# Patient Record
Sex: Male | Born: 1960 | Race: White | Hispanic: No | Marital: Married | Smoking: Former smoker
Health system: Southern US, Community
[De-identification: ages and names within clinical notes are randomized; demographics above are authoritative.]

## PROBLEM LIST (undated history)

## (undated) DIAGNOSIS — I10 Essential (primary) hypertension: Secondary | ICD-10-CM

## (undated) DIAGNOSIS — Z803 Family history of malignant neoplasm of breast: Secondary | ICD-10-CM

## (undated) DIAGNOSIS — K635 Polyp of colon: Secondary | ICD-10-CM

## (undated) DIAGNOSIS — C801 Malignant (primary) neoplasm, unspecified: Secondary | ICD-10-CM

## (undated) DIAGNOSIS — E785 Hyperlipidemia, unspecified: Secondary | ICD-10-CM

## (undated) DIAGNOSIS — Z8042 Family history of malignant neoplasm of prostate: Secondary | ICD-10-CM

## (undated) DIAGNOSIS — L718 Other rosacea: Secondary | ICD-10-CM

## (undated) HISTORY — DX: Polyp of colon: K63.5

## (undated) HISTORY — PX: OTHER SURGICAL HISTORY: SHX169

## (undated) HISTORY — DX: Other rosacea: L71.8

## (undated) HISTORY — PX: MOUTH SURGERY: SHX715

## (undated) HISTORY — PX: COLON SURGERY: SHX602

## (undated) HISTORY — DX: Essential (primary) hypertension: I10

## (undated) HISTORY — DX: Family history of malignant neoplasm of prostate: Z80.42

## (undated) HISTORY — DX: Family history of malignant neoplasm of breast: Z80.3

## (undated) HISTORY — DX: Hyperlipidemia, unspecified: E78.5

---

## 2005-11-24 ENCOUNTER — Ambulatory Visit: Payer: Self-pay | Admitting: Family Medicine

## 2007-04-29 ENCOUNTER — Ambulatory Visit: Payer: Self-pay | Admitting: Family Medicine

## 2007-09-23 ENCOUNTER — Ambulatory Visit: Payer: Self-pay | Admitting: Family Medicine

## 2007-12-19 ENCOUNTER — Ambulatory Visit: Payer: Self-pay | Admitting: Family Medicine

## 2008-02-19 ENCOUNTER — Ambulatory Visit: Payer: Self-pay | Admitting: Family Medicine

## 2008-11-11 ENCOUNTER — Ambulatory Visit: Payer: Self-pay | Admitting: Family Medicine

## 2010-03-13 HISTORY — PX: COLONOSCOPY: SHX174

## 2010-05-11 ENCOUNTER — Encounter (INDEPENDENT_AMBULATORY_CARE_PROVIDER_SITE_OTHER): Payer: BC Managed Care – PPO | Admitting: Family Medicine

## 2010-05-11 ENCOUNTER — Encounter (INDEPENDENT_AMBULATORY_CARE_PROVIDER_SITE_OTHER): Payer: Self-pay | Admitting: *Deleted

## 2010-05-11 DIAGNOSIS — I10 Essential (primary) hypertension: Secondary | ICD-10-CM

## 2010-05-11 DIAGNOSIS — Z23 Encounter for immunization: Secondary | ICD-10-CM

## 2010-05-11 DIAGNOSIS — E785 Hyperlipidemia, unspecified: Secondary | ICD-10-CM

## 2010-05-11 DIAGNOSIS — Z79899 Other long term (current) drug therapy: Secondary | ICD-10-CM

## 2010-05-19 NOTE — Letter (Signed)
Summary: Pre Visit Letter Revised  [facility]  [address] [place], [place] [address]   Phone: [phone]  Fax: [phone]        05/11/2010 MRN: [id number] [facility] [address] [place], [place]  [address]             Procedure Date:  June 14, 2010   dir col- Dr Marina Goodell   Welcome to the Gastroenterology Division at [facility].    You are scheduled to see a nurse for your pre-procedure visit on May 31, 2010 at 11:30am on the 3rd floor at [facility], [address] [employer].  We ask that you try to arrive at our office 15 minutes prior to your appointment time to allow for check-in.  Please take a minute to review the attached form.  If you answer "Yes" to one or more of the questions on the first page, we ask that you call the person listed at your earliest opportunity.  If you answer "No" to all of the questions, please complete the rest of the form and bring it to your appointment.    Your nurse visit will consist of discussing your medical and surgical history, your immediate family medical history, and your medications.   If you are unable to list all of your medications on the form, please bring the medication bottles to your appointment and we will list them.  We will need to be aware of both prescribed and over the counter drugs.  We will need to know exact dosage information as well.    Please be prepared to read and sign documents such as consent forms, a financial agreement, and acknowledgement forms.  If necessary, and with your consent, a friend or relative is welcome to sit-in on the nurse visit with you.  Please bring your insurance card so that we may make a copy of it.  If your insurance requires a referral to see a specialist, please bring your referral form from your primary care physician.  No co-pay is required for this nurse visit.     If you cannot keep your appointment, please call [phone] to cancel or reschedule prior to  your appointment date.  This allows [place] the opportunity to schedule an appointment for another patient in need of care.    Thank you for choosing [facility] for your medical needs.  We appreciate the opportunity to care for you.  Please visit [place] at our website  to learn more about our practice.  Sincerely, The Gastroenterology Division

## 2010-05-31 ENCOUNTER — Ambulatory Visit (AMBULATORY_SURGERY_CENTER): Payer: BC Managed Care – PPO | Admitting: *Deleted

## 2010-05-31 VITALS — Ht 76.0 in | Wt 238.0 lb

## 2010-05-31 DIAGNOSIS — Z1211 Encounter for screening for malignant neoplasm of colon: Secondary | ICD-10-CM

## 2010-05-31 MED ORDER — PEG-KCL-NACL-NASULF-NA ASC-C 100 G PO SOLR: 1.0000 | Freq: Once | ORAL | Status: AC

## 2010-05-31 NOTE — Patient Instructions (Signed)
Pick up RX for Moviprep within 5 days of today. Pick up RX for Moviprep within 5 days of today.

## 2010-06-01 ENCOUNTER — Encounter: Payer: Self-pay | Admitting: Internal Medicine

## 2010-06-13 ENCOUNTER — Telehealth: Payer: Self-pay | Admitting: Internal Medicine

## 2010-06-13 ENCOUNTER — Encounter: Payer: Self-pay | Admitting: *Deleted

## 2010-06-13 NOTE — Telephone Encounter (Signed)
Patient was reading instructions that came with prep and they were different than what he had received from previsit.  Pharmacy gave pt wrong prep.  Patient is going back to pharmacy to exchange prep.  Previsit nurse entered in correct prep.  Pt informed to call back if there is a problem.

## 2010-06-14 ENCOUNTER — Ambulatory Visit (AMBULATORY_SURGERY_CENTER): Payer: BC Managed Care – PPO | Admitting: Internal Medicine

## 2010-06-14 ENCOUNTER — Encounter: Payer: Self-pay | Admitting: Internal Medicine

## 2010-06-14 VITALS — BP 129/87 | HR 67 | Temp 97.7°F | Resp 15 | Ht 76.0 in | Wt 235.0 lb

## 2010-06-14 DIAGNOSIS — D126 Benign neoplasm of colon, unspecified: Secondary | ICD-10-CM

## 2010-06-14 DIAGNOSIS — Z1211 Encounter for screening for malignant neoplasm of colon: Secondary | ICD-10-CM

## 2010-06-14 LAB — HM COLONOSCOPY: HM Colonoscopy: NORMAL

## 2010-06-14 NOTE — Patient Instructions (Signed)
Review handouts on polyps.  We will send you a letter within two weeks regarding your polyp and when to return to see [place].  Call us if you have any questions at [phone].  Thank you.

## 2010-06-15 ENCOUNTER — Telehealth: Payer: Self-pay | Admitting: *Deleted

## 2010-06-15 NOTE — Telephone Encounter (Signed)

## 2010-09-09 ENCOUNTER — Telehealth: Payer: Self-pay | Admitting: Family Medicine

## 2010-09-09 NOTE — Telephone Encounter (Signed)
rcd message of pt call needs refill Vytorin 10/40mg  1 qd # 30,  Benicar HCT 20/12.5 mg 1 qd  #30, [facility]   [phone]

## 2010-09-09 NOTE — Telephone Encounter (Signed)
Check when the last visit was and if less than a year, cover him for a total of a year. Then he will need an appt

## 2010-09-12 ENCOUNTER — Other Ambulatory Visit: Payer: Self-pay

## 2010-09-12 ENCOUNTER — Telehealth: Payer: Self-pay | Admitting: Family Medicine

## 2010-09-12 MED ORDER — EZETIMIBE-SIMVASTATIN 10-40 MG PO TABS: 1.0000 | ORAL_TABLET | Freq: Every day | ORAL | Status: DC

## 2010-09-12 MED ORDER — OLMESARTAN MEDOXOMIL-HCTZ 20-12.5 MG PO TABS: 1.0000 | ORAL_TABLET | Freq: Every day | ORAL | Status: DC

## 2010-09-12 NOTE — Telephone Encounter (Signed)
Pt's wife called re refills on Vytorin and Benicar , pt had called Friday re: refills and [facility] and no call back. Dr. Susann Givens approved medication refills. Cheri sent in refills electronically [facility]

## 2010-09-13 ENCOUNTER — Telehealth: Payer: Self-pay

## 2010-09-13 NOTE — Telephone Encounter (Signed)
PT CALLED Friday AND LEFT MESSAGE FOR MED AND NO ONE TOOK CARE OF IT UNTIL I CAME BACK ALL HAS BEEN DONE

## 2010-09-15 ENCOUNTER — Encounter: Payer: Self-pay | Admitting: Family Medicine

## 2010-10-17 ENCOUNTER — Other Ambulatory Visit: Payer: Self-pay

## 2010-10-17 MED ORDER — OMEGA-3-ACID ETHYL ESTERS 1 G PO CAPS: 2.0000 g | ORAL_CAPSULE | Freq: Two times a day (BID) | ORAL | Status: DC

## 2010-11-08 ENCOUNTER — Encounter: Payer: Self-pay | Admitting: Medical

## 2010-11-08 ENCOUNTER — Ambulatory Visit (INDEPENDENT_AMBULATORY_CARE_PROVIDER_SITE_OTHER): Payer: BC Managed Care – PPO | Admitting: Medical

## 2010-11-08 VITALS — BP 128/98 | HR 64 | Temp 97.7°F | Resp 16 | Ht 76.0 in | Wt 223.0 lb

## 2010-11-08 DIAGNOSIS — E785 Hyperlipidemia, unspecified: Secondary | ICD-10-CM

## 2010-11-08 DIAGNOSIS — I10 Essential (primary) hypertension: Secondary | ICD-10-CM

## 2010-11-08 DIAGNOSIS — R51 Headache: Secondary | ICD-10-CM

## 2010-11-08 LAB — CBC WITH DIFFERENTIAL/PLATELET
Basophils Absolute: 0 10*3/uL (ref 0.0–0.1)
HCT: 45.9 % (ref 39.0–52.0)
Lymphocytes Relative: 15 % (ref 12–46)
Monocytes Absolute: 0.3 10*3/uL (ref 0.1–1.0)
Neutro Abs: 4.5 10*3/uL (ref 1.7–7.7)
Platelets: 184 10*3/uL (ref 150–400)
RDW: 13.1 % (ref 11.5–15.5)
WBC: 5.7 10*3/uL (ref 4.0–10.5)

## 2010-11-08 LAB — POCT URINALYSIS DIPSTICK
Leukocytes, UA: NEGATIVE
Nitrite, UA: NEGATIVE
Protein, UA: NEGATIVE
Urobilinogen, UA: NEGATIVE

## 2010-11-08 LAB — COMPREHENSIVE METABOLIC PANEL
ALT: 44 U/L (ref 0–53)
CO2: 27 mEq/L (ref 19–32)
Calcium: 9.7 mg/dL (ref 8.4–10.5)
Chloride: 104 mEq/L (ref 96–112)
Creat: 0.99 mg/dL (ref 0.50–1.35)

## 2010-11-08 LAB — TSH: TSH: 2.256 u[IU]/mL (ref 0.350–4.500)

## 2010-11-08 NOTE — Progress Notes (Signed)
Subjective:    Dylan Fischer is a 50 y.o. male who presents for evaluation of fluctuating blood pressure - he is a walk in pt today/unscheduled. He notes that he has been on blood pressure medicine since at least 2009. However he has tried to work on diet changes in the last several months, has lost 14 pounds since February of this year. He is not exercising regularly though. What brought him in today though are changes in his blood pressure. Last week he felt a little different than usual, had a headache, and he checked his blood pressure at work and it was low, 90/60.  Thus over the course of that week he took only one half tablet of his blood pressure pill everyday instead of the normal 1 tablet.  However over last few days at work his blood pressures have been really high.  Yesterday his blood pressure was 165/130, and today a little bit less than that. So he started back on a whole tablet of his blood pressure pill last 2 days.  He wanted to come in to make sure things are okay.  Otherwise he feels fine, no chest pain, no difficulty breathing, no prior heart problems.  He does note an occasional soreness in his left chest, but it's very mild and he doesn't think much about it.    Patient's cardiac risk factors are: dyslipidemia, hypertension, male gender, sedentary lifestyle, smoking/ tobacco exposure and family hx/o heart disease in both parents, onset over age 36, but not sure of exact age at onset. Previous cardiac testing: none.  The following portions of the patient's history were reviewed and updated as appropriate: allergies, current medications, past family history, past medical history, past social history, past surgical history and problem list.  Past Medical History  Diagnosis Date  . Hyperlipidemia   . Hypertension   . Colonic polyp     Review of Systems General: No fevers, chills, sweats, fatigue Skin: No diaphoresis HEENT: No recent sore throat, congestion, allergy symptoms,  sinus pressure Lungs: No shortness of breath, cough, wheezing Heart: No chest pain, palpitations, lower extremity edema, PND, orthopnea Abdomen: No abdominal pain, nausea, vomiting Neurology: No numbness, weakness, tingling, vision changes Psych: Some increased stress with work, no relationship problems at home, kids in college and this has caused some stress   Objective:     Gen.: Well-developed, well-nourished, white male, tall, seems a little anxious Skin: Unremarkable, not diaphoretic HEENT: Conjunctiva unremarkable, nares patent, pharynx normal Neck: supple, nontender, no thyromegaly, no JVD, no bruits Heart: RRR, normal S1, S2, no murmurs or gallops Lungs: CTA bilat, no wheezes, rhonchi, or rales Ext: no edema, cyanosis or clubbing Neuro: CN2-12 intact, no focal changes Psych: watery eyes, seems anxious, but states that his stressors aren't that significant  Cardiographics ECG: nsr, rate 62bpm, normal intervals, axis 62 degrees, T wave inversions V1, V2, somewhat poor R wave progression, otherwise no acute changes, no prior to compare.      Assessment:   Encounter Diagnoses  Name Primary?  Marland Kitchen Unspecified essential hypertension Yes  . Hyperlipidemia   . Headache      Plan:   Orthostatic vitals: Lying          136/88, pulse 128 Sitting         128/98, pulse 121 Standing     126/88, pulse 124  Discussed case with Dr. Susann Givens, supervising physician.  I advised pt that he has lost 14 lb which may contributing to lower pressures on average.  Advised that there are no worrisome acute changes today on EKG, and my personal BP measurements on him today are reassuring.  Thus, advised he keep record of his blood pressures taken after resting , and taken in the afternoon.  Get those back to me in 2 weeks so I can get an idea of average readings.  He will c/t his Benicar HCT 20/12.5mg , 1 tablet daily for now.  Discussed signs/symptoms of acute coronary syndrome, and advised that  those symptoms would prompt calling 911, however, he is currently asymptomatic.  Call sooner if needed or if other concerns.

## 2010-11-09 ENCOUNTER — Telehealth: Payer: Self-pay | Admitting: *Deleted

## 2010-11-09 ENCOUNTER — Ambulatory Visit: Payer: BC Managed Care – PPO | Admitting: Medical

## 2010-11-09 NOTE — Telephone Encounter (Addendum)
Message copied by Dorthula Perfect on Wed Nov 09, 2010 10:31 AM ------      Message from: Aleen Campi, DAVID S      Created: Wed Nov 09, 2010  8:14 AM       Labs were fine.  Again reiterate that we want him to take his BP medication, 1 tablet daily, regardless of pressure reading.  He can take his pressure at work after resting for 5 minutes, and write numbers down.  He doesn't have to check every single day.  Have him return pressure readings in 2 weeks so we can review.              Pt informed of lab results. And to take BP medication daily.  Pt also notified to take BP readings and write them down.  Pt agreed and will bring readings by office in 2 weeks.  CM, LPN

## 2010-11-24 ENCOUNTER — Telehealth: Payer: Self-pay | Admitting: *Deleted

## 2010-11-24 NOTE — Telephone Encounter (Addendum)
Message copied by Dorthula Perfect on Thu Nov 24, 2010  5:21 PM ------      Message from: Aleen Campi, DAVID S      Created: Thu Nov 24, 2010  7:45 AM       BP readings are low to low normal.  See if he is feeling ok or has he had any dizziness, weakness?  If feeling ok, no problems, then have him f/u with Dr. Susann Givens within a month recheck on BP/meds.     Pt notified of BP readings and patient will call back within a month to schedule an appointment with Dr. Susann Givens for BP/med check.  CM,LPN

## 2011-02-14 ENCOUNTER — Other Ambulatory Visit: Payer: Self-pay | Admitting: Family Medicine

## 2011-03-11 ENCOUNTER — Other Ambulatory Visit: Payer: Self-pay | Admitting: Family Medicine

## 2011-05-21 ENCOUNTER — Other Ambulatory Visit: Payer: Self-pay | Admitting: Family Medicine

## 2011-07-10 ENCOUNTER — Other Ambulatory Visit: Payer: Self-pay | Admitting: Medical

## 2011-07-10 ENCOUNTER — Encounter: Payer: Self-pay | Admitting: Medical

## 2011-07-10 ENCOUNTER — Ambulatory Visit (INDEPENDENT_AMBULATORY_CARE_PROVIDER_SITE_OTHER): Payer: BC Managed Care – PPO | Admitting: Medical

## 2011-07-10 VITALS — BP 110/80 | HR 68 | Temp 98.2°F | Resp 16 | Wt 212.0 lb

## 2011-07-10 DIAGNOSIS — F4389 Other reactions to severe stress: Secondary | ICD-10-CM

## 2011-07-10 DIAGNOSIS — F430 Acute stress reaction: Secondary | ICD-10-CM

## 2011-07-10 DIAGNOSIS — E785 Hyperlipidemia, unspecified: Secondary | ICD-10-CM

## 2011-07-10 DIAGNOSIS — I10 Essential (primary) hypertension: Secondary | ICD-10-CM

## 2011-07-10 DIAGNOSIS — Z79899 Other long term (current) drug therapy: Secondary | ICD-10-CM

## 2011-07-10 DIAGNOSIS — F438 Other reactions to severe stress: Secondary | ICD-10-CM

## 2011-07-10 LAB — HEPATIC FUNCTION PANEL
ALT: 32 U/L (ref 0–53)
AST: 24 U/L (ref 0–37)
Alkaline Phosphatase: 54 U/L (ref 39–117)
Indirect Bilirubin: 0.6 mg/dL (ref 0.0–0.9)
Total Protein: 6.9 g/dL (ref 6.0–8.3)

## 2011-07-10 LAB — LIPID PANEL
Cholesterol: 180 mg/dL (ref 0–200)
Triglycerides: 110 mg/dL (ref <150)

## 2011-07-10 MED ORDER — ALPRAZOLAM 0.25 MG PO TABS
0.2500 mg | ORAL_TABLET | Freq: Every evening | ORAL | Status: AC | PRN
Start: 2011-07-10 — End: 2011-08-09

## 2011-07-10 NOTE — Progress Notes (Signed)
Subjective:   HPI  Dylan Fischer is a 51 y.o. male who presents for recheck on blood pressure and issues with his nerves.  Last visit here was 10/2010.    He notes that he usually runs on the low end of normal with his blood pressure, on medication and compliant.  However, he is concerned that he may need to be on medication to help control his pressure when his "nerves" act up.  For example, if he has a personal issue, if his nerves get the best of him, his BP will go up to 140-150s/90-100s, but usually runs low normal in general.   Lately he just notes the DBP going in the 90-100s.  Yesterday he had 140s/90s.  He notes personal issues with relationship is the only concern.  Work issues are fine.   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  His wife at times accuses him on things he's not doing, they have arguments over little things.  He says they always work things out on their own, but have been to counseling in the past.  She is not open to counseling currently.  He exercises some, has lost more weight with diet changes and exercise. No other aggravating or relieving factors.    He wants his fasting labs today as he is on medication for cholesterol and blood pressure.  He sees urology for prostate/PSA.  No other c/o.  The following portions of the patient's history were reviewed and updated as appropriate: allergies, current medications, past family history, past medical history, past social history, past surgical history and problem list.  Past Medical History  Diagnosis Date  . Hyperlipidemia   . Hypertension   . Colonic polyp     No Known Allergies   Review of Systems ROS reviewed and was negative other than noted in HPI or above.    Objective:   Physical Exam  General appearance: alert, no distress, WD/WN Oral cavity: MMM, no lesions Neck: supple, no lymphadenopathy, no thyromegaly, no masses Heart: RRR, normal S1, S2, no  murmurs Lungs: CTA bilaterally, no wheezes, rhonchi, or rales Pulses: 2+ symmetric Ext: no edema  Assessment and Plan :     Encounter Diagnoses  Name Primary?  . Acute stress reaction Yes  . Hyperlipidemia   . Encounter for long-term (current) use of other medications   . Essential hypertension, benign    Acute stress reaction - discussed his concerns.   Explained that BP can fluctuate with stress, flight or fight response, normal daily fluctuation as well.  Advised exercise regularly, advised he consider counseling to deal with the underlying issue - relationship issues/marital stress and stress from where he has 2 daughters out of the house now, 1 in college, 1 living out of state, some empty nester issues.   Discussed medication options including daily medication like citalopram vs acute use of buspar or xanax.  He has used xanax briefly in remote past for similar.  Thus, discussed risk/benefits of medication, can use prn use of Xanax for now, along with the other strategies discussed.  If having to use frequently, then we will need to recheck to discuss daily medication.  Hyperlipidemia - labs today, c/t same medication  HTN - controlled, c/t same medication

## 2011-07-10 NOTE — Patient Instructions (Signed)

## 2011-07-24 ENCOUNTER — Other Ambulatory Visit: Payer: Self-pay | Admitting: Family Medicine

## 2011-08-22 ENCOUNTER — Other Ambulatory Visit: Payer: Self-pay | Admitting: Family Medicine

## 2011-09-25 ENCOUNTER — Other Ambulatory Visit: Payer: Self-pay | Admitting: Family Medicine

## 2011-11-28 ENCOUNTER — Other Ambulatory Visit: Payer: Self-pay | Admitting: Medical

## 2011-12-27 ENCOUNTER — Other Ambulatory Visit: Payer: Self-pay | Admitting: Family Medicine

## 2011-12-27 NOTE — Telephone Encounter (Signed)
Patient needs a office visit before this medication runs out. 

## 2012-01-27 ENCOUNTER — Other Ambulatory Visit: Payer: Self-pay | Admitting: Family Medicine

## 2012-02-24 ENCOUNTER — Other Ambulatory Visit: Payer: Self-pay | Admitting: Medical

## 2012-02-27 ENCOUNTER — Other Ambulatory Visit: Payer: Self-pay | Admitting: Family Medicine

## 2012-02-27 ENCOUNTER — Other Ambulatory Visit: Payer: Self-pay | Admitting: Medical

## 2012-04-29 ENCOUNTER — Other Ambulatory Visit: Payer: Self-pay | Admitting: Medical

## 2012-05-03 ENCOUNTER — Other Ambulatory Visit: Payer: Self-pay | Admitting: Medical

## 2012-07-02 ENCOUNTER — Other Ambulatory Visit: Payer: Self-pay | Admitting: Medical

## 2012-08-28 ENCOUNTER — Other Ambulatory Visit: Payer: Self-pay | Admitting: Medical

## 2012-08-28 NOTE — Telephone Encounter (Signed)
PATIENT NEEDS TO SCHEDULE A OFFICE VISIT TO FOLLOW UP ON BLOOD PRESSURE LAST APPOINTMENT WAS 06/2011. HE IS DUE NOW

## 2012-10-03 ENCOUNTER — Other Ambulatory Visit: Payer: Self-pay | Admitting: Medical

## 2012-11-29 ENCOUNTER — Other Ambulatory Visit: Payer: Self-pay | Admitting: Medical

## 2012-12-09 ENCOUNTER — Other Ambulatory Visit: Payer: Self-pay | Admitting: Medical

## 2013-02-04 ENCOUNTER — Other Ambulatory Visit: Payer: Self-pay | Admitting: Family Medicine

## 2013-02-05 ENCOUNTER — Other Ambulatory Visit: Payer: Self-pay | Admitting: Family Medicine

## 2013-02-05 ENCOUNTER — Telehealth: Payer: Self-pay | Admitting: Family Medicine

## 2013-02-05 MED ORDER — OMEGA-3-ACID ETHYL ESTERS 1 G PO CAPS: 1.0000 g | ORAL_CAPSULE | Freq: Four times a day (QID) | ORAL | Status: DC

## 2013-02-05 MED ORDER — OLMESARTAN MEDOXOMIL-HCTZ 20-12.5 MG PO TABS: 1.0000 | ORAL_TABLET | Freq: Every day | ORAL | Status: DC

## 2013-02-05 NOTE — Telephone Encounter (Signed)
RX refills sent for only 30 days will give more refills once he has his OV. CLS

## 2013-02-05 NOTE — Telephone Encounter (Signed)
Pt called and need refill for Benicar and Lovaza.  The Lovaza he takes 4 tablets per day and we have listed 2 tablets per day, so his last refill was for only 15 day supply.  Please correct.  Pt has appt here on Monday for med check.  [facility]

## 2013-02-10 ENCOUNTER — Encounter: Payer: Self-pay | Admitting: Medical

## 2013-02-12 ENCOUNTER — Ambulatory Visit (INDEPENDENT_AMBULATORY_CARE_PROVIDER_SITE_OTHER): Payer: BC Managed Care – PPO | Admitting: Medical

## 2013-02-12 ENCOUNTER — Encounter: Payer: Self-pay | Admitting: Medical

## 2013-02-12 VITALS — BP 120/78 | HR 60 | Temp 98.1°F | Resp 16 | Wt 223.0 lb

## 2013-02-12 DIAGNOSIS — Z79899 Other long term (current) drug therapy: Secondary | ICD-10-CM

## 2013-02-12 DIAGNOSIS — E785 Hyperlipidemia, unspecified: Secondary | ICD-10-CM | POA: Insufficient documentation

## 2013-02-12 DIAGNOSIS — I10 Essential (primary) hypertension: Secondary | ICD-10-CM

## 2013-02-12 DIAGNOSIS — Z23 Encounter for immunization: Secondary | ICD-10-CM

## 2013-02-12 MED ORDER — OLMESARTAN MEDOXOMIL-HCTZ 20-12.5 MG PO TABS: 1.0000 | ORAL_TABLET | Freq: Every day | ORAL | Status: DC

## 2013-02-12 MED ORDER — OMEGA-3-ACID ETHYL ESTERS 1 G PO CAPS: 2.0000 g | ORAL_CAPSULE | Freq: Two times a day (BID) | ORAL | Status: DC

## 2013-02-12 MED ORDER — EZETIMIBE-SIMVASTATIN 10-40 MG PO TABS: 1.0000 | ORAL_TABLET | Freq: Every day | ORAL | Status: DC

## 2013-02-12 NOTE — Progress Notes (Signed)
Subjective: There for routine med check.  Here for follow-up of hypertension. Started on BP meds in 2009.   He is not exercising.  He claims to be adherent to a low-salt diet.  Blood pressure is well controlled at home.  SBP usually ranges 115-120.   DBP usually ranges 75-80.  Cardiac symptoms: none. Patient denies: chest pain, dyspnea, exertional chest pressure/discomfort, fatigue and palpitations. Cardiovascular risk factors: dyslipidemia, hypertension, male gender and sedentary lifestyle. Use of agents associated with hypertension: none. History of target organ damage: none.    Dylan Fischer is here for follow up of elevated lipids.  Put on lipid medications before 2009 . Compliance with treatment has been good.  Patient denies muscle pain associated with male medications.   He is a nonsmoker.    In general he is eating red meat, but tries to avoid fried foods.  He does eat out a lot though.    No new problems.     Colonoscopy was in 2012.  Sees urology every 1-2 years for prostate eval/screening.     Past Medical History  Diagnosis Date  . Hyperlipidemia   . Hypertension   . Colonic polyp    ROS as in subjective  Objective: Filed Vitals:   02/12/13 1446  BP: 120/78  Pulse: 60  Temp: 98.1 F (36.7 C)  Resp: 16    General appearance: alert, no distress, WD/WN, white male Oral cavity: MMM, no lesions Neck: supple, no lymphadenopathy, no thyromegaly, no masses, no bruits Heart: RRR, normal S1, S2, no murmurs Lungs: CTA bilaterally, no wheezes, rhonchi, or rales Abdomen: +bs, soft, non tender, non distended, no masses, no hepatomegaly, no splenomegaly Pulses: 2+ symmetric, upper and lower extremities, normal cap refill Ext: no edema GU/rectal - deferred to urology  Assessment: Encounter Diagnoses  Name Primary?  . Essential hypertension, benign Yes  . Hyperlipidemia   . Encounter for long-term (current) use of other medications   . Need for prophylactic vaccination and  inoculation against influenza      Plan: Advise he start exercising, recommend he eat more healthy foods, eat out less Hypertension-controlled on current medicine, refill medications today Hyperlipidemia-reviewed his recent labs from employer screen showing improved lipid panel, and Hgb A1c was 5.4%.  Labs from October 2014 Liver panel today Counseled on the influenza virus vaccine.  Vaccine information sheet given.  Influenza vaccine given after consent obtained. Follow-up pending lab

## 2013-02-13 LAB — HEPATIC FUNCTION PANEL
ALT: 42 U/L (ref 0–53)
Albumin: 4.7 g/dL (ref 3.5–5.2)
Indirect Bilirubin: 0.7 mg/dL (ref 0.0–0.9)
Total Protein: 7 g/dL (ref 6.0–8.3)

## 2013-02-20 ENCOUNTER — Encounter: Payer: Self-pay | Admitting: Medical

## 2013-05-09 ENCOUNTER — Other Ambulatory Visit: Payer: Self-pay | Admitting: Medical

## 2013-08-10 ENCOUNTER — Other Ambulatory Visit: Payer: Self-pay | Admitting: Medical

## 2013-09-08 ENCOUNTER — Other Ambulatory Visit: Payer: Self-pay | Admitting: Medical

## 2013-09-08 NOTE — Telephone Encounter (Signed)
PATIENT NEEDS A OFFICE VISIT TO FOLLOW UP ON MEDICATION

## 2013-10-16 ENCOUNTER — Other Ambulatory Visit: Payer: Self-pay | Admitting: Medical

## 2013-10-16 ENCOUNTER — Telehealth: Payer: Self-pay | Admitting: Internal Medicine

## 2013-10-16 MED ORDER — OMEGA-3-ACID ETHYL ESTERS 1 G PO CAPS: 2.0000 g | ORAL_CAPSULE | Freq: Two times a day (BID) | ORAL | Status: DC

## 2013-10-16 NOTE — Telephone Encounter (Signed)
Pt states that he is having joint pain again. He was changed from generic and needs to go back to brand name Lovaza. Send to [facility]

## 2013-10-22 ENCOUNTER — Telehealth: Payer: Self-pay | Admitting: Family Medicine

## 2013-10-22 NOTE — Telephone Encounter (Signed)
Patient called because he requested Rx for brand name lovaza because he is having joint pain since taking generic. He states that when he went to pick up Rx he was given generic again. Per his chart, we sent in name brand. [facility] Patient had refill waiting and then we sent in new RX so when patient picked up RX he was given the refill that was waiting not the new RX for brand name.  Dylan Fischer was able to get Rx thru the system, he will have to order and patient can pick up tomorrow afternoon. Left message for patient informing him of this

## 2013-11-11 ENCOUNTER — Other Ambulatory Visit: Payer: Self-pay | Admitting: Medical

## 2013-11-11 NOTE — Telephone Encounter (Signed)
PATIENT NEEDS A MEDICATION CHECK BEFORE HIS MEDICATION RUNS OUT

## 2013-12-08 ENCOUNTER — Other Ambulatory Visit: Payer: Self-pay | Admitting: Medical

## 2013-12-22 ENCOUNTER — Other Ambulatory Visit: Payer: Self-pay | Admitting: Medical

## 2013-12-30 ENCOUNTER — Telehealth: Payer: Self-pay | Admitting: Medical

## 2013-12-30 NOTE — Telephone Encounter (Signed)
Pt came by and dropped off labs. I am sending back in your folder.

## 2014-01-06 ENCOUNTER — Other Ambulatory Visit: Payer: Self-pay | Admitting: Medical

## 2014-01-06 NOTE — Telephone Encounter (Signed)
Patient was told in 02/2013 that he was told to follow in 6 months. I called the patient to let him know he was due for a follow up visit. He states that he drop off some lab work that he had done at his job last week. Does he still have to come in for a OV?

## 2014-01-09 ENCOUNTER — Encounter: Payer: Self-pay | Admitting: Medical

## 2014-01-09 NOTE — Telephone Encounter (Signed)
PLEASE SCHEDULE YOUR PHYSICAL FOR DECEMBER

## 2014-01-16 ENCOUNTER — Encounter: Payer: Self-pay | Admitting: Medical

## 2014-04-08 ENCOUNTER — Other Ambulatory Visit: Payer: Self-pay | Admitting: Medical

## 2014-04-08 NOTE — Telephone Encounter (Signed)
Looks like labs were done back in October 2015 so i will refill it

## 2014-05-08 ENCOUNTER — Other Ambulatory Visit: Payer: Self-pay | Admitting: Medical

## 2014-05-20 ENCOUNTER — Encounter: Payer: Self-pay | Admitting: Medical

## 2014-05-20 ENCOUNTER — Ambulatory Visit (INDEPENDENT_AMBULATORY_CARE_PROVIDER_SITE_OTHER): Payer: BLUE CROSS/BLUE SHIELD | Admitting: Medical

## 2014-05-20 VITALS — BP 120/80 | HR 59 | Temp 97.8°F | Resp 15 | Wt 230.0 lb

## 2014-05-20 DIAGNOSIS — M25511 Pain in right shoulder: Secondary | ICD-10-CM

## 2014-05-20 DIAGNOSIS — I10 Essential (primary) hypertension: Secondary | ICD-10-CM

## 2014-05-20 DIAGNOSIS — E785 Hyperlipidemia, unspecified: Secondary | ICD-10-CM | POA: Diagnosis not present

## 2014-05-20 LAB — CBC WITH DIFFERENTIAL/PLATELET
BASOS PCT: 0 % (ref 0–1)
Basophils Absolute: 0 10*3/uL (ref 0.0–0.1)
EOS PCT: 2 % (ref 0–5)
Eosinophils Absolute: 0.1 10*3/uL (ref 0.0–0.7)
HEMATOCRIT: 47.4 % (ref 39.0–52.0)
Hemoglobin: 16.3 g/dL (ref 13.0–17.0)
LYMPHS ABS: 1 10*3/uL (ref 0.7–4.0)
Lymphocytes Relative: 25 % (ref 12–46)
MCH: 30.8 pg (ref 26.0–34.0)
MCHC: 34.4 g/dL (ref 30.0–36.0)
MCV: 89.6 fL (ref 78.0–100.0)
MPV: 9.8 fL (ref 8.6–12.4)
Monocytes Absolute: 0.3 10*3/uL (ref 0.1–1.0)
Monocytes Relative: 9 % (ref 3–12)
Neutro Abs: 2.4 10*3/uL (ref 1.7–7.7)
Neutrophils Relative %: 64 % (ref 43–77)
Platelets: 180 10*3/uL (ref 150–400)
RBC: 5.29 MIL/uL (ref 4.22–5.81)
RDW: 13.1 % (ref 11.5–15.5)
WBC: 3.8 10*3/uL — AB (ref 4.0–10.5)

## 2014-05-20 LAB — COMPREHENSIVE METABOLIC PANEL
ALBUMIN: 4.4 g/dL (ref 3.5–5.2)
ALT: 52 U/L (ref 0–53)
AST: 31 U/L (ref 0–37)
Alkaline Phosphatase: 44 U/L (ref 39–117)
BUN: 15 mg/dL (ref 6–23)
CALCIUM: 9.4 mg/dL (ref 8.4–10.5)
CHLORIDE: 103 meq/L (ref 96–112)
CO2: 27 mEq/L (ref 19–32)
Creat: 0.96 mg/dL (ref 0.50–1.35)
Glucose, Bld: 103 mg/dL — ABNORMAL HIGH (ref 70–99)
Potassium: 4.2 mEq/L (ref 3.5–5.3)
Sodium: 140 mEq/L (ref 135–145)
TOTAL PROTEIN: 6.8 g/dL (ref 6.0–8.3)
Total Bilirubin: 1 mg/dL (ref 0.2–1.2)

## 2014-05-20 MED ORDER — EZETIMIBE-SIMVASTATIN 10-40 MG PO TABS: 1.0000 | ORAL_TABLET | Freq: Every day | ORAL | Status: DC

## 2014-05-20 MED ORDER — OLMESARTAN MEDOXOMIL-HCTZ 20-12.5 MG PO TABS: 1.0000 | ORAL_TABLET | Freq: Every day | ORAL | Status: DC

## 2014-05-20 MED ORDER — LOVAZA 1 G PO CAPS: 2.0000 g | ORAL_CAPSULE | Freq: Two times a day (BID) | ORAL | Status: DC

## 2014-05-20 NOTE — Progress Notes (Signed)
Subjective: Here for routine follow-up and med check. Last visit here December 2014, here for recheck on hypertension and hyperlipidemia.  Hypertension-medication includes Benicar HCT 20/12.5 once daily.  Hyperlipidemia-medication includes lovaza and Vytorin.  Patient's cardiac risk factors are: advanced age (older than 54 for men, 66 for women), dyslipidemia, hypertension and male gender.   Works as a Designer, jewellery.  Nonsmoker.    Of note, he had a colonoscopy in 2012, due next year.  Trying to do some exercise with body weight but having some right shoulder issues.   Had trauma to shoulder years ago.  No other c/o.  Past Medical History  Diagnosis Date  . Hyperlipidemia   . Hypertension   . Colonic polyp     ROS as in subjective  Objective: BP 120/80 mmHg  Pulse 59  Temp(Src) 97.8 F (36.6 C) (Oral)  Resp 15  Wt 230 lb (104.327 kg)  General appearance: alert, no distress, WD/WN, white male Oral cavity: MMM, no lesions Neck: supple, no lymphadenopathy, no thyromegaly, no masses Heart: RRR, normal S1, S2, no murmurs Lungs: CTA bilaterally, no wheezes, rhonchi, or rales Abdomen: +bs, soft, non tender, non distended, no masses, no hepatomegaly, no splenomegaly Pulses: 2+ symmetric, upper and lower extremities, normal cap refill Ext: no edema Neuro: normal UE strength, DTRs, sensation, CN2-12 intact MSK: right shoulder with somewhat decreased internal ROM, mildly tender with apprehension test, mild pain with right adduction, otherwise exam unremarkable.  Left arm exam unremarkable  Assessment: Encounter Diagnoses  Name Primary?  . Essential hypertension Yes  . Hyperlipidemia   . Right shoulder pain     Plan: HTN - continue current medication, reviewed EKG from 2012, labs today  Hyperlipidemia - I reviewed a lipid panel from October 2015 that was sent to [facility] showing HDL 39, total cholesterol 163, triglycerides 201, hemoglobin A1c 5.6%, BMI 27.4, blood pressure  at that time 132/88 LDL cholesterol 84, cholesterol HDL ratio 4.2.  C/t Vytorin and Lovaza.  Right shoulder pain-gave a home exercise rehabilitation routine to begin. He will work on these exercises over the next 1-2 months, follow-up if not seeing significant improvement

## 2014-05-21 NOTE — Progress Notes (Signed)
LM to [facility] 

## 2014-06-02 ENCOUNTER — Other Ambulatory Visit: Payer: Self-pay | Admitting: Medical

## 2014-12-18 ENCOUNTER — Other Ambulatory Visit: Payer: Self-pay | Admitting: Medical

## 2015-01-20 ENCOUNTER — Other Ambulatory Visit: Payer: Self-pay | Admitting: Medical

## 2015-03-21 ENCOUNTER — Other Ambulatory Visit: Payer: Self-pay | Admitting: Medical

## 2015-04-03 ENCOUNTER — Telehealth: Payer: Self-pay | Admitting: Medical

## 2015-04-03 NOTE — Telephone Encounter (Signed)
Wife states needs exception for Vytorin, pt unable to take generic, caused extreme joint pain & other symptoms.  Failed multiple other meds.  Initiated P.A. Claris Pong

## 2015-04-07 NOTE — Telephone Encounter (Signed)
P.A. Claris Pong approved til 04/04/2016, left message for pt & wife

## 2015-04-07 NOTE — Telephone Encounter (Signed)
Pt informed of both

## 2015-04-07 NOTE — Telephone Encounter (Signed)
Pt had also asked about Lovaza, costing so much & inquired if was [place] it is covered, last fill cost $291, but that was because of dedutible.  Once deductible is met then cost will be $144 for brand & $46 for generic

## 2015-05-27 ENCOUNTER — Telehealth: Payer: Self-pay | Admitting: Family Medicine

## 2015-05-27 NOTE — Telephone Encounter (Signed)
Sharyn Lull called & states they are going out of country in April to [place] & would like Rx for Cipro called [facility], she didn't know if they needed to be seen or if you would just call it in?

## 2015-05-28 ENCOUNTER — Encounter: Payer: Self-pay | Admitting: Internal Medicine

## 2015-05-29 NOTE — Telephone Encounter (Signed)
Looks like wife's was called in, can you also send in [facility]?

## 2015-05-30 MED ORDER — CIPROFLOXACIN HCL 500 MG PO TABS: 500.0000 mg | ORAL_TABLET | Freq: Two times a day (BID) | ORAL | Status: DC

## 2015-06-09 ENCOUNTER — Telehealth: Payer: Self-pay | Admitting: Medical

## 2015-06-09 ENCOUNTER — Other Ambulatory Visit: Payer: Self-pay | Admitting: Medical

## 2015-06-09 MED ORDER — EZETIMIBE-SIMVASTATIN 10-40 MG PO TABS: 1.0000 | ORAL_TABLET | Freq: Every day | ORAL | Status: DC

## 2015-06-09 NOTE — Telephone Encounter (Addendum)
Rcvd refill 90 day request for Vytorin 10-40 mg. Pt has Med Ck scheduled on 06/17/15

## 2015-06-09 NOTE — Telephone Encounter (Signed)
Med sent but make sure they f/u as scheduled in early April fasting!

## 2015-06-12 ENCOUNTER — Other Ambulatory Visit: Payer: Self-pay | Admitting: Medical

## 2015-06-17 ENCOUNTER — Encounter: Payer: Self-pay | Admitting: Medical

## 2015-06-17 ENCOUNTER — Ambulatory Visit (INDEPENDENT_AMBULATORY_CARE_PROVIDER_SITE_OTHER): Payer: Managed Care, Other (non HMO) | Admitting: Medical

## 2015-06-17 DIAGNOSIS — I10 Essential (primary) hypertension: Secondary | ICD-10-CM | POA: Diagnosis not present

## 2015-06-17 DIAGNOSIS — E785 Hyperlipidemia, unspecified: Secondary | ICD-10-CM

## 2015-06-17 DIAGNOSIS — Z0000 Encounter for general adult medical examination without abnormal findings: Secondary | ICD-10-CM | POA: Insufficient documentation

## 2015-06-17 LAB — COMPREHENSIVE METABOLIC PANEL
ALT: 48 U/L — ABNORMAL HIGH (ref 9–46)
AST: 31 U/L (ref 10–35)
Albumin: 4.3 g/dL (ref 3.6–5.1)
Alkaline Phosphatase: 45 U/L (ref 40–115)
BILIRUBIN TOTAL: 1 mg/dL (ref 0.2–1.2)
BUN: 13 mg/dL (ref 7–25)
CO2: 26 mmol/L (ref 20–31)
CREATININE: 0.84 mg/dL (ref 0.70–1.33)
Calcium: 9 mg/dL (ref 8.6–10.3)
Chloride: 104 mmol/L (ref 98–110)
GLUCOSE: 91 mg/dL (ref 65–99)
Potassium: 4.2 mmol/L (ref 3.5–5.3)
SODIUM: 140 mmol/L (ref 135–146)
Total Protein: 6.8 g/dL (ref 6.1–8.1)

## 2015-06-17 LAB — LIPID PANEL
Cholesterol: 147 mg/dL (ref 125–200)
HDL: 34 mg/dL — ABNORMAL LOW (ref >=40)
LDL CALC: 82 mg/dL (ref <130)
Total CHOL/HDL Ratio: 4.3 Ratio (ref <=5.0)
Triglycerides: 153 mg/dL — ABNORMAL HIGH (ref <150)
VLDL: 31 mg/dL — ABNORMAL HIGH (ref <30)

## 2015-06-17 LAB — CBC WITH DIFFERENTIAL/PLATELET
Basophils Absolute: 0 cells/uL (ref 0–200)
Basophils Relative: 0 %
EOS PCT: 3 %
Eosinophils Absolute: 129 cells/uL (ref 15–500)
HCT: 46.1 % (ref 38.5–50.0)
HEMOGLOBIN: 16.1 g/dL (ref 13.2–17.1)
LYMPHS ABS: 989 {cells}/uL (ref 850–3900)
Lymphocytes Relative: 23 %
MCH: 31.4 pg (ref 27.0–33.0)
MCHC: 34.9 g/dL (ref 32.0–36.0)
MCV: 89.9 fL (ref 80.0–100.0)
MPV: 10.3 fL (ref 7.5–12.5)
Monocytes Absolute: 344 cells/uL (ref 200–950)
Monocytes Relative: 8 %
NEUTROS ABS: 2838 {cells}/uL (ref 1500–7800)
NEUTROS PCT: 66 %
PLATELETS: 189 10*3/uL (ref 140–400)
RBC: 5.13 MIL/uL (ref 4.20–5.80)
RDW: 13.1 % (ref 11.0–15.0)
WBC: 4.3 10*3/uL (ref 4.0–10.5)

## 2015-06-17 NOTE — Progress Notes (Signed)
Subjective: Here for routine follow-up and med check.  Here for recheck on hypertension and hyperlipidemia.  Hypertension-medication includes Benicar HCT 20/12.5 once daily, but we just changed it to generic when he called in.  Insurance wouldn't pay for the name brand.  Hyperlipidemia-medication includes Vytorin.  Lovaza was too expensive so he never got this.   vytorin is over $90 with current insurance so may want to change to something else  Patient's cardiac risk factors are: advanced age (older than 44 for men, 37 for women), dyslipidemia, hypertension and male gender.   Works as a Designer, jewellery.  Nonsmoker.    No other c/o.  Past Medical History  Diagnosis Date  . Hyperlipidemia   . Hypertension   . Colonic polyp     ROS as in subjective  Objective: BP 120/84 mmHg  Pulse 66  Wt 229 lb (103.874 kg)  General appearance: alert, no distress, WD/WN, white male Oral cavity: MMM, no lesions Neck: supple, no lymphadenopathy, no thyromegaly, no masses, no bruits Heart: RRR, normal S1, S2, no murmurs Lungs: CTA bilaterally, no wheezes, rhonchi, or rales Pulses: 2+ symmetric, upper and lower extremities, normal cap refill Ext: no edema Neuro: normal UE strength, DTRs, sensation, CN2-12 intact    Assessment: Encounter Diagnoses  Name Primary?  . Essential hypertension, benign   . Hyperlipidemia     Plan: HTN - continue current medication, labs today  Hyperlipidemia - labs today, c/t Vytorin but will likely need to change due to cost  Needs physical and advised next visit should be a full physical.  F/u pending labs

## 2015-06-18 ENCOUNTER — Other Ambulatory Visit: Payer: Self-pay | Admitting: Medical

## 2015-06-18 ENCOUNTER — Encounter: Payer: Self-pay | Admitting: Medical

## 2015-06-18 LAB — HEMOGLOBIN A1C
HEMOGLOBIN A1C: 5.6 % (ref <5.7)
MEAN PLASMA GLUCOSE: 114 mg/dL

## 2015-06-18 MED ORDER — OLMESARTAN MEDOXOMIL-HCTZ 20-12.5 MG PO TABS: 1.0000 | ORAL_TABLET | Freq: Every day | ORAL | Status: DC

## 2015-06-18 MED ORDER — PRAVASTATIN SODIUM 40 MG PO TABS: 40.0000 mg | ORAL_TABLET | Freq: Every day | ORAL | Status: DC

## 2015-06-18 MED ORDER — ASPIRIN EC 81 MG PO TBEC: 81.0000 mg | DELAYED_RELEASE_TABLET | Freq: Every day | ORAL | Status: DC

## 2015-06-18 MED ORDER — NIACIN 250 MG PO TABS: ORAL_TABLET | ORAL | Status: DC

## 2015-12-19 ENCOUNTER — Other Ambulatory Visit: Payer: Self-pay | Admitting: Medical

## 2016-01-16 ENCOUNTER — Other Ambulatory Visit: Payer: Self-pay | Admitting: Medical

## 2016-01-17 NOTE — Telephone Encounter (Signed)
Faxed meds. 

## 2016-02-13 ENCOUNTER — Other Ambulatory Visit: Payer: Self-pay | Admitting: Medical

## 2016-02-14 NOTE — Telephone Encounter (Signed)
LM on VCM that he is due for physical. Dylan Fischer

## 2016-02-23 ENCOUNTER — Other Ambulatory Visit: Payer: Self-pay | Admitting: Medical

## 2016-02-25 ENCOUNTER — Encounter: Payer: Self-pay | Admitting: Medical

## 2016-02-25 ENCOUNTER — Ambulatory Visit (INDEPENDENT_AMBULATORY_CARE_PROVIDER_SITE_OTHER): Payer: Managed Care, Other (non HMO) | Admitting: Medical

## 2016-02-25 VITALS — BP 130/72 | Ht 76.0 in | Wt 233.8 lb

## 2016-02-25 DIAGNOSIS — Z7189 Other specified counseling: Secondary | ICD-10-CM | POA: Diagnosis not present

## 2016-02-25 DIAGNOSIS — E785 Hyperlipidemia, unspecified: Secondary | ICD-10-CM

## 2016-02-25 DIAGNOSIS — Z125 Encounter for screening for malignant neoplasm of prostate: Secondary | ICD-10-CM | POA: Diagnosis not present

## 2016-02-25 DIAGNOSIS — Z860100 Personal history of colon polyps, unspecified: Secondary | ICD-10-CM | POA: Insufficient documentation

## 2016-02-25 DIAGNOSIS — Z7185 Encounter for immunization safety counseling: Secondary | ICD-10-CM | POA: Insufficient documentation

## 2016-02-25 DIAGNOSIS — I10 Essential (primary) hypertension: Secondary | ICD-10-CM | POA: Diagnosis not present

## 2016-02-25 DIAGNOSIS — Z0000 Encounter for general adult medical examination without abnormal findings: Secondary | ICD-10-CM

## 2016-02-25 DIAGNOSIS — Z23 Encounter for immunization: Secondary | ICD-10-CM | POA: Diagnosis not present

## 2016-02-25 LAB — COMPREHENSIVE METABOLIC PANEL
ALT: 46 U/L (ref 9–46)
AST: 29 U/L (ref 10–35)
Albumin: 4.3 g/dL (ref 3.6–5.1)
Alkaline Phosphatase: 51 U/L (ref 40–115)
BILIRUBIN TOTAL: 0.9 mg/dL (ref 0.2–1.2)
BUN: 15 mg/dL (ref 7–25)
CALCIUM: 9.2 mg/dL (ref 8.6–10.3)
CO2: 28 mmol/L (ref 20–31)
Chloride: 104 mmol/L (ref 98–110)
Creat: 1.03 mg/dL (ref 0.70–1.33)
GLUCOSE: 109 mg/dL — AB (ref 65–99)
POTASSIUM: 4.2 mmol/L (ref 3.5–5.3)
Sodium: 139 mmol/L (ref 135–146)
Total Protein: 6.6 g/dL (ref 6.1–8.1)

## 2016-02-25 LAB — CBC
HEMATOCRIT: 46.7 % (ref 38.5–50.0)
Hemoglobin: 16.3 g/dL (ref 13.2–17.1)
MCH: 31 pg (ref 27.0–33.0)
MCHC: 34.9 g/dL (ref 32.0–36.0)
MCV: 88.8 fL (ref 80.0–100.0)
MPV: 10 fL (ref 7.5–12.5)
PLATELETS: 195 10*3/uL (ref 140–400)
RBC: 5.26 MIL/uL (ref 4.20–5.80)
RDW: 13.5 % (ref 11.0–15.0)
WBC: 3.9 10*3/uL — AB (ref 4.0–10.5)

## 2016-02-25 LAB — LIPID PANEL
CHOL/HDL RATIO: 6.2 ratio — AB (ref <5.0)
Cholesterol: 197 mg/dL (ref <200)
HDL: 32 mg/dL — AB (ref >40)
LDL CALC: 123 mg/dL — AB (ref <100)
Triglycerides: 211 mg/dL — ABNORMAL HIGH (ref <150)
VLDL: 42 mg/dL — ABNORMAL HIGH (ref <30)

## 2016-02-25 LAB — POCT URINALYSIS DIPSTICK
Bilirubin, UA: NEGATIVE
Blood, UA: NEGATIVE
CLARITY UA: NEGATIVE
GLUCOSE UA: NEGATIVE
Ketones, UA: NEGATIVE
Leukocytes, UA: NEGATIVE
Nitrite, UA: NEGATIVE
Protein, UA: NEGATIVE
SPEC GRAV UA: 1.02
UROBILINOGEN UA: NEGATIVE
pH, UA: 6

## 2016-02-25 LAB — HEMOGLOBIN A1C
HEMOGLOBIN A1C: 5.2 % (ref <5.7)
Mean Plasma Glucose: 103 mg/dL

## 2016-02-25 LAB — PSA: PSA: 1.4 ng/mL (ref <=4.0)

## 2016-02-25 NOTE — Progress Notes (Signed)
Subjective:   HPI  Dylan Fischer is a 55 y.o. male who presents for a complete physical.  Medical care team includes: Eye doctor, dentist, dermatologist Dorothea Ogle, PA-C here for primary care  Concerns: None  Reviewed their medical, surgical, family, social, medication, and allergy history and updated chart as appropriate.  Past Medical History:  Diagnosis Date  . Colonic polyp   . Hyperlipidemia   . Hypertension   . Ocular rosacea    on Doxycycline long term    Past Surgical History:  Procedure Laterality Date  . COLONOSCOPY  2012   due 2017.   Dr. Henrene Pastor, pending f/u 06/2015  . MOUTH SURGERY      Social History   Social History  . Marital status: Married    Spouse name: N/A  . Number of children: N/A  . Years of education: N/A   Occupational History  . Not on file.   Social History Main Topics  . Smoking status: Current Some Day Smoker    Types: Cigars  . Smokeless tobacco: Never Used  . Alcohol use 4.0 oz/week    4 Standard drinks or equivalent per week     Comment: occasionally  . Drug use: No  . Sexual activity: Not on file   Other Topics Concern  . Not on file   Social History Narrative   Married, 2 children, 27yo and 24yo.  1 child at [facility] in [place], 1 daughter an Psychologist, prison and probation services in [place].  Works as Estate agent.  Wife is admin at [employer].  Exercise - walking, doing some elliptical, eating healthy.   02/2016    Family History  Problem Relation Age of Onset  . Cancer Mother     breast  . Arthritis Mother   . Hyperlipidemia Mother   . Heart disease Mother     55  . Hypertension Father   . COPD Father   . Hyperlipidemia Father   . Stroke Father   . Aneurysm Father 35     Current Outpatient Prescriptions:  .  aspirin EC 81 MG tablet, Take 1 tablet (81 mg total) by mouth daily., Disp: 90 tablet, Rfl: 3 .  doxycycline (DORYX) 100 MG EC tablet, Take 100 mg by mouth 2 (two) times daily., Disp: , Rfl:   .  niacin 250 MG tablet, 1 tablet po QHS x 2 wk, then 2 tablet QHS, Disp: 60 tablet, Rfl: 5 .  olmesartan-hydrochlorothiazide (BENICAR HCT) 20-12.5 MG tablet, TAKE 1 TABLET BY MOUTH DAILY., Disp: 30 tablet, Rfl: 0 .  pravastatin (PRAVACHOL) 40 MG tablet, TAKE 1 TABLET BY MOUTH DAILY, Disp: 30 tablet, Rfl: 0  No Known Allergies    Review of Systems Constitutional: -fever, -chills, -sweats, -unexpected weight change, -decreased appetite, -fatigue Allergy: -sneezing, -itching, -congestion Dermatology: -changing moles, --rash, -lumps ENT: -runny nose, -ear pain, -sore throat, -hoarseness, -sinus pain, -teeth pain, - ringing in ears, -hearing loss, -nosebleeds Cardiology: -chest pain, -palpitations, -swelling, -difficulty breathing when lying flat, -waking up short of breath Respiratory: -cough, -shortness of breath, -difficulty breathing with exercise or exertion, -wheezing, -coughing up blood Gastroenterology: -abdominal pain, -nausea, -vomiting, -diarrhea, -constipation, -blood in stool, -changes in bowel movement, -difficulty swallowing or eating Hematology: -bleeding, -bruising  Musculoskeletal: -joint aches, -muscle aches, -joint swelling, +mild back pain, -neck pain, -cramping, -changes in gait Ophthalmology: denies vision changes, eye redness, itching, discharge Urology: -burning with urination, -difficulty urinating, -blood in urine, -urinary frequency, -urgency, -incontinence Neurology: -headache, -weakness, -tingling, -numbness, -memory loss, -falls, -dizziness  Psychology: -depressed mood, -agitation, -sleep problems     Objective:   Physical Exam  BP 130/72   Ht 6\' 4"  (1.93 m)   Wt 233 lb 12.8 oz (106.1 kg)   SpO2 97%   BMI 28.46 kg/m   General appearance: alert, no distress, WD/WN, white male Skin: scattered freckles, no worrisome lesions HEENT: normocephalic, conjunctiva/corneas normal, sclerae anicteric, PERRLA, EOMi, nares patent, no discharge or erythema, pharynx  normal Oral cavity: MMM, tongue normal, teeth normal Neck: supple, no lymphadenopathy, no thyromegaly, no masses, normal ROM, no bruits Chest: non tender, normal shape and expansion Heart: RRR, normal S1, S2, no murmurs Lungs: CTA bilaterally, no wheezes, rhonchi, or rales Abdomen: +bs, soft, non tender, non distended, no masses, no hepatomegaly, no splenomegaly, no bruits Back: non tender, normal ROM, no scoliosis Musculoskeletal: upper extremities non tender, no obvious deformity, normal ROM throughout, lower extremities non tender, no obvious deformity, normal ROM throughout Extremities: no edema, no cyanosis, no clubbing Pulses: 2+ symmetric, upper and lower extremities, normal cap refill Neurological: alert, oriented x 3, CN2-12 intact, strength normal upper extremities and lower extremities, sensation normal throughout, DTRs 2+ throughout, no cerebellar signs, gait normal Psychiatric: normal affect, behavior normal, pleasant  GU: normal male external genitalia,circumcised, nontender, no masses, no hernia, no lymphadenopathy Rectal: anus normal tone, prostate mildly enlarged, no nodules, occult negative stool   Adult ECG Report  Indication: HTN  Rate: 60 bpm  Rhythm: normal sinus rhythm  QRS Axis: 88 degrees  PR Interval: 120ms  QRS Duration: 109ms  QTc: 469ms  Conduction Disturbances: none  Other Abnormalities: nonspecific T wave abnormality  Patient's cardiac risk factors are: dyslipidemia, hypertension, male gender and smoking/ tobacco exposure.  EKG comparison: 2012  Narrative Interpretation: sinus brady, no acute changes    Assessment and Plan :    Encounter Diagnoses  Name Primary?  . Encounter for health maintenance examination in adult Yes  . Essential hypertension, benign   . Hyperlipidemia, unspecified hyperlipidemia type   . History of colonic polyps   . Need for Tdap vaccination   . Screening for prostate cancer   . Vaccine counseling     Physical exam -  discussed healthy lifestyle, diet, exercise, preventative care, vaccinations, and addressed their concerns.   Counseled on the Tdap (tetanus, diptheria, and acellular pertussis) vaccine.  Vaccine information sheet given. Tdap vaccine given after consent obtained. Declines flu shot counseled on shingles vaccine and he will consider Advised no tobacco, but of note, he rarely smokes a cigar He is due for colonoscopy and he will contact GI Follow-up pending labs  Noctis was seen today for physical.  Diagnoses and all orders for this visit:  Encounter for health maintenance examination in adult -     Hemoglobin A1c -     CBC -     Comprehensive metabolic panel -     Lipid panel -     PSA -     EKG 12-Lead -     Urinalysis Dipstick  Essential hypertension, benign -     Hemoglobin A1c -     EKG 12-Lead  Hyperlipidemia, unspecified hyperlipidemia type -     Hemoglobin A1c -     Lipid panel  History of colonic polyps  Need for Tdap vaccination -     Tdap vaccine greater than or equal to 7yo IM  Screening for prostate cancer -     PSA  Vaccine counseling

## 2016-02-25 NOTE — Patient Instructions (Signed)
Encounter Diagnoses  Name Primary?  . Encounter for health maintenance examination in adult Yes  . Essential hypertension, benign   . Hyperlipidemia, unspecified hyperlipidemia type   . History of colonic polyps   . Need for Tdap vaccination   . Screening for prostate cancer   . Vaccine counseling    Recommendations:  We will call with lab results  Plan to get colonoscopy 2018

## 2016-02-28 ENCOUNTER — Other Ambulatory Visit: Payer: Self-pay | Admitting: Medical

## 2016-02-28 MED ORDER — ASPIRIN EC 81 MG PO TBEC: 81.0000 mg | DELAYED_RELEASE_TABLET | Freq: Every day | ORAL | 3 refills | Status: DC

## 2016-02-28 MED ORDER — ICOSAPENT ETHYL 1 G PO CAPS: 2.0000 g | ORAL_CAPSULE | Freq: Two times a day (BID) | ORAL | 11 refills | Status: DC

## 2016-02-28 MED ORDER — OLMESARTAN MEDOXOMIL-HCTZ 20-12.5 MG PO TABS: 1.0000 | ORAL_TABLET | Freq: Every day | ORAL | 3 refills | Status: DC

## 2016-02-28 MED ORDER — ROSUVASTATIN CALCIUM 40 MG PO TABS: 40.0000 mg | ORAL_TABLET | Freq: Every day | ORAL | 3 refills | Status: DC

## 2016-03-08 NOTE — Telephone Encounter (Signed)
This encounter was created in error - please disregard.

## 2016-03-09 ENCOUNTER — Telehealth: Payer: Self-pay | Admitting: Medical

## 2016-03-09 NOTE — Telephone Encounter (Signed)
Decline as we switched to crestor.  Is there a hold up with prior auth?

## 2016-03-09 NOTE — Telephone Encounter (Signed)
Rcvd refill request for 90 day supply of [facility] 40mg  [facility]

## 2016-03-13 NOTE — Telephone Encounter (Signed)
No P.A. Has been received on Pravastatin, so I Called pharmacy, generic Crestor went thru and pt already picked up, cancelled the Pravastatin and there is no more refills on that & advised pharmacy pt is no longer on that

## 2016-03-22 ENCOUNTER — Other Ambulatory Visit: Payer: Self-pay | Admitting: Medical

## 2016-06-06 ENCOUNTER — Telehealth: Payer: Self-pay | Admitting: Family Medicine

## 2016-06-06 MED ORDER — CIPROFLOXACIN HCL 500 MG PO TABS: 500.0000 mg | ORAL_TABLET | Freq: Two times a day (BID) | ORAL | 0 refills | Status: DC

## 2016-06-06 NOTE — Telephone Encounter (Signed)
Wife, Sharyn Lull, requesting script for Cipro since they are traveling to [place] soon. Wife said Dr Redmond School gave him this script when they traveled there last year

## 2016-09-14 ENCOUNTER — Other Ambulatory Visit: Payer: Self-pay | Admitting: Medical

## 2016-12-14 ENCOUNTER — Other Ambulatory Visit: Payer: Self-pay | Admitting: Medical

## 2017-01-13 ENCOUNTER — Other Ambulatory Visit: Payer: Self-pay | Admitting: Medical

## 2017-02-13 ENCOUNTER — Telehealth: Payer: Self-pay | Admitting: Family Medicine

## 2017-02-13 NOTE — Telephone Encounter (Signed)
Okay to switch but he needs an appointment for follow-up as it has been a year with either me or Audelia Acton whomever he would like to see

## 2017-02-13 NOTE — Telephone Encounter (Signed)
[facility]'d [facility] that Rosuvastatin $27 & Atorvastatin 80mg  $17 and pt request to change to lower cost alternative Atorvastatin.

## 2017-02-13 NOTE — Telephone Encounter (Signed)
Pt states that he wants to stay on the rosuvastatin. He was advised to schedule appt. He states he will CB to schedule. He was advised that we can not renew medications until appt is scheduled. Dylan Fischer

## 2017-02-15 ENCOUNTER — Encounter: Payer: Self-pay | Admitting: Family Medicine

## 2017-02-15 ENCOUNTER — Ambulatory Visit: Payer: 59 | Admitting: Family Medicine

## 2017-02-15 VITALS — BP 130/70 | HR 73 | Resp 16 | Wt 237.2 lb

## 2017-02-15 DIAGNOSIS — Z86010 Personal history of colonic polyps: Secondary | ICD-10-CM | POA: Diagnosis not present

## 2017-02-15 DIAGNOSIS — Z7189 Other specified counseling: Secondary | ICD-10-CM | POA: Diagnosis not present

## 2017-02-15 DIAGNOSIS — L718 Other rosacea: Secondary | ICD-10-CM | POA: Diagnosis not present

## 2017-02-15 DIAGNOSIS — I10 Essential (primary) hypertension: Secondary | ICD-10-CM | POA: Diagnosis not present

## 2017-02-15 DIAGNOSIS — Z7185 Encounter for immunization safety counseling: Secondary | ICD-10-CM

## 2017-02-15 DIAGNOSIS — Z23 Encounter for immunization: Secondary | ICD-10-CM

## 2017-02-15 DIAGNOSIS — E785 Hyperlipidemia, unspecified: Secondary | ICD-10-CM

## 2017-02-15 DIAGNOSIS — Z1159 Encounter for screening for other viral diseases: Secondary | ICD-10-CM

## 2017-02-15 MED ORDER — DOXYCYCLINE HYCLATE 100 MG PO TBEC: 100.0000 mg | DELAYED_RELEASE_TABLET | Freq: Two times a day (BID) | ORAL | 3 refills | Status: DC

## 2017-02-15 MED ORDER — ROSUVASTATIN CALCIUM 40 MG PO TABS: 40.0000 mg | ORAL_TABLET | Freq: Every day | ORAL | 3 refills | Status: DC

## 2017-02-15 MED ORDER — ICOSAPENT ETHYL 1 G PO CAPS: 2.0000 | ORAL_CAPSULE | Freq: Two times a day (BID) | ORAL | 3 refills | Status: DC

## 2017-02-15 MED ORDER — OLMESARTAN MEDOXOMIL-HCTZ 20-12.5 MG PO TABS: 1.0000 | ORAL_TABLET | Freq: Every day | ORAL | 3 refills | Status: DC

## 2017-02-15 NOTE — Progress Notes (Signed)
   Subjective:    Patient ID: Dylan Fischer, male    DOB: Jul 24, 1960, 55 y.o.   MRN: [id number]  HPI He is here for an interval evaluation.  He does have underlying hypertension and continues on Benicar without difficulty.  He also has hyperlipidemia and is on Crestor as well as Vascepa and having no difficulty with them.  He does have a history of colonic polyps and was supposed to have a colonoscopy last year.  He would like to postpone this another year in spite of my strong recommendation he get it now.  He also has a history of ocular rosacea and would like a refill on his doxycycline which keeps this under good control.  He is now retired and starting into an exercise program.   Review of Systems     Objective:   Physical Exam Alert and in no distress. Tympanic membranes and canals are normal. Pharyngeal area is normal. Neck is supple without adenopathy or thyromegaly. Cardiac exam shows a regular sinus rhythm without murmurs or gallops. Lungs are clear to auscultation.        Assessment & Plan:  Need for hepatitis C screening test - Plan: Hepatitis C antibody  Essential hypertension, benign - Plan: olmesartan-hydrochlorothiazide (BENICAR HCT) 20-12.5 MG tablet, CBC with Differential/Platelet, Comprehensive metabolic panel  Hyperlipidemia, unspecified hyperlipidemia type - Plan: rosuvastatin (CRESTOR) 40 MG tablet, Icosapent Ethyl (VASCEPA) 1 g CAPS, Lipid panel  History of colonic polyps  Vaccine counseling - Plan: Flu Vaccine QUAD 6+ mos PF IM (Fluarix Quad PF)  Ocular rosacea - Plan: doxycycline (DORYX) 100 MG EC tablet  Need for influenza vaccination - Plan: Flu Vaccine QUAD 6+ mos PF IM (Fluarix Quad PF)  I discussed retirement with him in detail explaining the need for him to keep his mind and body busy.  Also discussed the intellectual, psychological and social aspects of retirement that he needs to address.

## 2017-02-16 LAB — COMPREHENSIVE METABOLIC PANEL
AG Ratio: 1.8 (calc) (ref 1.0–2.5)
ALBUMIN MSPROF: 4.4 g/dL (ref 3.6–5.1)
ALKALINE PHOSPHATASE (APISO): 51 U/L (ref 40–115)
ALT: 45 U/L (ref 9–46)
AST: 29 U/L (ref 10–35)
BILIRUBIN TOTAL: 0.8 mg/dL (ref 0.2–1.2)
BUN: 19 mg/dL (ref 7–25)
CALCIUM: 9.8 mg/dL (ref 8.6–10.3)
CO2: 27 mmol/L (ref 20–32)
CREATININE: 0.96 mg/dL (ref 0.70–1.33)
Chloride: 100 mmol/L (ref 98–110)
Globulin: 2.5 g/dL (calc) (ref 1.9–3.7)
Glucose, Bld: 88 mg/dL (ref 65–99)
POTASSIUM: 4.4 mmol/L (ref 3.5–5.3)
Sodium: 137 mmol/L (ref 135–146)
Total Protein: 6.9 g/dL (ref 6.1–8.1)

## 2017-02-16 LAB — CBC WITH DIFFERENTIAL/PLATELET
BASOS ABS: 39 {cells}/uL (ref 0–200)
Basophils Relative: 0.6 %
EOS ABS: 117 {cells}/uL (ref 15–500)
Eosinophils Relative: 1.8 %
HEMATOCRIT: 45.6 % (ref 38.5–50.0)
Hemoglobin: 16.2 g/dL (ref 13.2–17.1)
LYMPHS ABS: 1138 {cells}/uL (ref 850–3900)
MCH: 31 pg (ref 27.0–33.0)
MCHC: 35.5 g/dL (ref 32.0–36.0)
MCV: 87.2 fL (ref 80.0–100.0)
MPV: 10.6 fL (ref 7.5–12.5)
Monocytes Relative: 8.1 %
NEUTROS PCT: 72 %
Neutro Abs: 4680 cells/uL (ref 1500–7800)
PLATELETS: 212 10*3/uL (ref 140–400)
RBC: 5.23 10*6/uL (ref 4.20–5.80)
RDW: 12.6 % (ref 11.0–15.0)
TOTAL LYMPHOCYTE: 17.5 %
WBC: 6.5 10*3/uL (ref 3.8–10.8)
WBCMIX: 527 {cells}/uL (ref 200–950)

## 2017-02-16 LAB — LIPID PANEL
CHOL/HDL RATIO: 4.5 (calc) (ref <5.0)
CHOLESTEROL: 156 mg/dL (ref <200)
HDL: 35 mg/dL — AB (ref >40)
LDL CHOLESTEROL (CALC): 92 mg/dL
Non-HDL Cholesterol (Calc): 121 mg/dL (calc) (ref <130)
Triglycerides: 196 mg/dL — ABNORMAL HIGH (ref <150)

## 2017-02-16 LAB — HEPATITIS C ANTIBODY
Hepatitis C Ab: NONREACTIVE
SIGNAL TO CUT-OFF: 0.01 (ref <1.00)

## 2017-02-17 ENCOUNTER — Other Ambulatory Visit: Payer: Self-pay | Admitting: Medical

## 2017-02-23 ENCOUNTER — Telehealth: Payer: Self-pay | Admitting: Family Medicine

## 2017-02-23 MED ORDER — DOXYCYCLINE HYCLATE 100 MG PO TABS: 100.0000 mg | ORAL_TABLET | Freq: Two times a day (BID) | ORAL | 3 refills | Status: DC

## 2017-02-23 NOTE — Telephone Encounter (Signed)
Pt left voicemail stating that time release Doxycycline is not covered by his insurance, he need Doxycycline hyclate 100 mg

## 2017-08-22 ENCOUNTER — Other Ambulatory Visit: Payer: Self-pay | Admitting: Medical

## 2017-11-17 ENCOUNTER — Other Ambulatory Visit: Payer: Self-pay | Admitting: Medical

## 2017-12-06 ENCOUNTER — Other Ambulatory Visit: Payer: Self-pay | Admitting: Medical

## 2018-01-17 ENCOUNTER — Other Ambulatory Visit: Payer: Self-pay | Admitting: Medical

## 2018-02-20 ENCOUNTER — Other Ambulatory Visit: Payer: Self-pay | Admitting: Family Medicine

## 2018-02-20 DIAGNOSIS — E785 Hyperlipidemia, unspecified: Secondary | ICD-10-CM

## 2018-02-23 ENCOUNTER — Other Ambulatory Visit: Payer: Self-pay | Admitting: Medical

## 2018-03-18 ENCOUNTER — Other Ambulatory Visit: Payer: Self-pay | Admitting: Family Medicine

## 2018-05-22 ENCOUNTER — Ambulatory Visit (INDEPENDENT_AMBULATORY_CARE_PROVIDER_SITE_OTHER): Payer: 59 | Admitting: Family Medicine

## 2018-05-22 ENCOUNTER — Other Ambulatory Visit: Payer: Self-pay

## 2018-05-22 ENCOUNTER — Encounter: Payer: Self-pay | Admitting: Family Medicine

## 2018-05-22 VITALS — BP 118/78 | HR 58 | Temp 97.6°F | Ht 76.0 in | Wt 224.8 lb

## 2018-05-22 DIAGNOSIS — Z860100 Personal history of colon polyps, unspecified: Secondary | ICD-10-CM

## 2018-05-22 DIAGNOSIS — E785 Hyperlipidemia, unspecified: Secondary | ICD-10-CM

## 2018-05-22 DIAGNOSIS — L718 Other rosacea: Secondary | ICD-10-CM

## 2018-05-22 DIAGNOSIS — I10 Essential (primary) hypertension: Secondary | ICD-10-CM

## 2018-05-22 DIAGNOSIS — Z0000 Encounter for general adult medical examination without abnormal findings: Secondary | ICD-10-CM

## 2018-05-22 DIAGNOSIS — R42 Dizziness and giddiness: Secondary | ICD-10-CM | POA: Diagnosis not present

## 2018-05-22 LAB — LIPID PANEL
Chol/HDL Ratio: 4.4 ratio (ref 0.0–5.0)
Cholesterol, Total: 160 mg/dL (ref 100–199)
HDL: 36 mg/dL — ABNORMAL LOW (ref >39)
LDL Calculated: 83 mg/dL (ref 0–99)
TRIGLYCERIDES: 203 mg/dL — AB (ref 0–149)
VLDL Cholesterol Cal: 41 mg/dL — ABNORMAL HIGH (ref 5–40)

## 2018-05-22 LAB — POCT URINALYSIS DIP (PROADVANTAGE DEVICE)
BILIRUBIN UA: NEGATIVE
Blood, UA: NEGATIVE
Glucose, UA: NEGATIVE mg/dL
Ketones, POC UA: NEGATIVE mg/dL
Leukocytes, UA: NEGATIVE
Nitrite, UA: NEGATIVE
Protein Ur, POC: NEGATIVE mg/dL
Specific Gravity, Urine: 1.03
Urobilinogen, Ur: 3.5
pH, UA: 6 (ref 5.0–8.0)

## 2018-05-22 LAB — COMPREHENSIVE METABOLIC PANEL
ALBUMIN: 4.7 g/dL (ref 3.8–4.9)
ALK PHOS: 62 IU/L (ref 39–117)
ALT: 33 IU/L (ref 0–44)
AST: 25 IU/L (ref 0–40)
Albumin/Globulin Ratio: 2.1 (ref 1.2–2.2)
BUN/Creatinine Ratio: 18 (ref 9–20)
BUN: 16 mg/dL (ref 6–24)
Bilirubin Total: 0.7 mg/dL (ref 0.0–1.2)
CO2: 25 mmol/L (ref 20–29)
Calcium: 9.5 mg/dL (ref 8.7–10.2)
Chloride: 101 mmol/L (ref 96–106)
Creatinine, Ser: 0.91 mg/dL (ref 0.76–1.27)
GFR calc Af Amer: 106 mL/min/{1.73_m2} (ref >59)
GFR calc non Af Amer: 92 mL/min/{1.73_m2} (ref >59)
Globulin, Total: 2.2 g/dL (ref 1.5–4.5)
Glucose: 109 mg/dL — ABNORMAL HIGH (ref 65–99)
Potassium: 3.9 mmol/L (ref 3.5–5.2)
Sodium: 142 mmol/L (ref 134–144)
Total Protein: 6.9 g/dL (ref 6.0–8.5)

## 2018-05-22 LAB — CBC WITH DIFFERENTIAL/PLATELET
Basophils Absolute: 0 10*3/uL (ref 0.0–0.2)
Basos: 1 %
EOS (ABSOLUTE): 0.1 10*3/uL (ref 0.0–0.4)
EOS: 2 %
HEMATOCRIT: 47.9 % (ref 37.5–51.0)
HEMOGLOBIN: 16.5 g/dL (ref 13.0–17.7)
Immature Grans (Abs): 0 10*3/uL (ref 0.0–0.1)
Immature Granulocytes: 0 %
LYMPHS ABS: 1 10*3/uL (ref 0.7–3.1)
Lymphs: 17 %
MCH: 31.1 pg (ref 26.6–33.0)
MCHC: 34.4 g/dL (ref 31.5–35.7)
MCV: 90 fL (ref 79–97)
MONOCYTES: 8 %
Monocytes Absolute: 0.5 10*3/uL (ref 0.1–0.9)
Neutrophils Absolute: 4.5 10*3/uL (ref 1.4–7.0)
Neutrophils: 72 %
Platelets: 209 10*3/uL (ref 150–450)
RBC: 5.31 x10E6/uL (ref 4.14–5.80)
RDW: 12.4 % (ref 11.6–15.4)
WBC: 6.2 10*3/uL (ref 3.4–10.8)

## 2018-05-22 MED ORDER — ROSUVASTATIN CALCIUM 40 MG PO TABS: 40.0000 mg | ORAL_TABLET | Freq: Every day | ORAL | 1 refills | Status: DC

## 2018-05-22 MED ORDER — ICOSAPENT ETHYL 1 G PO CAPS: ORAL_CAPSULE | ORAL | 3 refills | Status: DC

## 2018-05-22 MED ORDER — OLMESARTAN MEDOXOMIL 20 MG PO TABS: 20.0000 mg | ORAL_TABLET | Freq: Every day | ORAL | 3 refills | Status: DC

## 2018-05-22 MED ORDER — HYDROCHLOROTHIAZIDE 12.5 MG PO TABS: 12.5000 mg | ORAL_TABLET | Freq: Every day | ORAL | 3 refills | Status: DC

## 2018-05-22 NOTE — Progress Notes (Signed)
Subjective:    Patient ID: Dylan Fischer, male    DOB: 1960-07-08, 58 y.o.   MRN: [id number]  HPI He is here for complete examination.  He is now retired and seems to be keeping himself fairly busy.  He continues on his blood pressure medication as well as Crestor and Vascepa and having no difficulty with that.  He has had difficulty with dizziness as well as ringing in the ears.  This has been going on for several years.  He does occasionally smoke and drinks socially.  Exercise is several times per week.  He does use doxycycline for his rosacea on an as-needed basis.  Otherwise he has no particular concerns or complaints.  Family and social history as well as health maintenance and immunizations was reviewed   Review of Systems  All other systems reviewed and are negative.      Objective:   Physical Exam BP 118/78 (BP Location: Left Arm, Patient Position: Sitting)   Pulse (!) 58   Temp 97.6 F (36.4 C)   Ht 6\' 4"  (1.93 m)   Wt 224 lb 12.8 oz (102 kg)   SpO2 96%   BMI 27.36 kg/m   General Appearance:    Alert, cooperative, no distress, appears stated age  Head:    Normocephalic, without obvious abnormality, atraumatic  Eyes:    PERRL, conjunctiva/corneas clear, EOM's intact, fundi    benign  Ears:    Normal TM's and external ear canals  Nose:   Nares normal, mucosa normal, no drainage or sinus   tenderness  Throat:   Lips, mucosa, and tongue normal; teeth and gums normal  Neck:   Supple, no lymphadenopathy;  thyroid:  no   enlargement/tenderness/nodules; no carotid   bruit or JVD  Back:    Spine nontender, no curvature, ROM normal, no CVA     tenderness  Lungs:     Clear to auscultation bilaterally without wheezes, rales or     ronchi; respirations unlabored  Chest Wall:    No tenderness or deformity   Heart:    Regular rate and rhythm, S1 and S2 normal, no murmur, rub   or gallop  Breast Exam:    No chest wall tenderness, masses or gynecomastia  Abdomen:     Soft,  non-tender, nondistended, normoactive bowel sounds,    no masses, no hepatosplenomegaly  Genitalia:   Deferred  Rectal:   Deferred  Extremities:   No clubbing, cyanosis or edema  Pulses:   2+ and symmetric all extremities  Skin:   Skin color, texture, turgor normal, no rashes or lesions  Lymph nodes:   Cervical, supraclavicular, and axillary nodes normal  Neurologic:   CNII-XII intact, normal strength, sensation and gait; reflexes 2+ and symmetric throughout          Psych:   Normal mood, affect, hygiene and grooming.   Hearing screening was essentially negative.       Assessment & Plan:  Routine general medical examination at a health care facility - Plan: CBC with Differential/Platelet, Comprehensive metabolic panel, Lipid panel, POCT Urinalysis DIP (Proadvantage Device)  Essential hypertension, benign - Plan: CBC with Differential/Platelet, Comprehensive metabolic panel, olmesartan (BENICAR) 20 MG tablet, hydrochlorothiazide (HYDRODIURIL) 12.5 MG tablet  Hyperlipidemia, unspecified hyperlipidemia type - Plan: Lipid panel, rosuvastatin (CRESTOR) 40 MG tablet, Icosapent Ethyl (VASCEPA) 1 g CAPS  History of colonic polyps - Plan: Ambulatory referral to Gastroenterology  Dizziness - Plan: Hearing screening  Ocular rosacea Discussed the hearing test with  him and if he wants to be referred to ENT I will. Also encouraged him to keep himself busy intellectually as well as socially. I will place him on Crestor and continue him on Vascepa.

## 2018-05-27 ENCOUNTER — Other Ambulatory Visit: Payer: Self-pay | Admitting: Family Medicine

## 2018-05-27 DIAGNOSIS — E785 Hyperlipidemia, unspecified: Secondary | ICD-10-CM

## 2018-05-27 NOTE — Telephone Encounter (Signed)
[facility]. Please advise [facility]

## 2018-06-20 ENCOUNTER — Other Ambulatory Visit: Payer: Self-pay | Admitting: Medical

## 2018-06-20 NOTE — Telephone Encounter (Signed)
Is this okay to refill? Could not find on patient's current med list.

## 2018-12-13 ENCOUNTER — Other Ambulatory Visit: Payer: Self-pay | Admitting: Family Medicine

## 2018-12-13 DIAGNOSIS — E785 Hyperlipidemia, unspecified: Secondary | ICD-10-CM

## 2019-04-22 ENCOUNTER — Telehealth: Payer: Self-pay

## 2019-04-22 NOTE — Telephone Encounter (Signed)
Pt. Called stating he would like a referral done to a vascular doctor he has been having pain on the inside of his lt. Leg, also has a raised vein in that area on his leg been going on for a while now, wasn't sure if you could just do a referral for him or if Dr. Redmond School needed to see him first.

## 2019-04-22 NOTE — Telephone Encounter (Signed)
He needs an appointment with me first

## 2019-04-22 NOTE — Telephone Encounter (Signed)
Please see Dylan Fischer's note and advise . Thanks [employer]

## 2019-04-23 NOTE — Telephone Encounter (Signed)
Pt. Has been scheduled for an apt. tomorrow

## 2019-04-24 ENCOUNTER — Encounter: Payer: Self-pay | Admitting: Family Medicine

## 2019-04-24 ENCOUNTER — Other Ambulatory Visit: Payer: Self-pay

## 2019-04-24 ENCOUNTER — Ambulatory Visit: Payer: Managed Care, Other (non HMO) | Admitting: Family Medicine

## 2019-04-24 VITALS — BP 114/79 | HR 68 | Temp 98.0°F | Wt 224.0 lb

## 2019-04-24 DIAGNOSIS — F489 Nonpsychotic mental disorder, unspecified: Secondary | ICD-10-CM

## 2019-04-24 NOTE — Progress Notes (Signed)
   Subjective:    Patient ID: Dylan Fischer, male    DOB: 06/04/1960, 59 y.o.   MRN: [id number]  HPI   Review of Systems     Objective:   Physical Exam        Assessment & Plan:  No

## 2019-04-28 ENCOUNTER — Other Ambulatory Visit: Payer: Self-pay

## 2019-04-28 ENCOUNTER — Ambulatory Visit (INDEPENDENT_AMBULATORY_CARE_PROVIDER_SITE_OTHER): Payer: Managed Care, Other (non HMO) | Admitting: Family Medicine

## 2019-04-28 ENCOUNTER — Encounter: Payer: Self-pay | Admitting: Family Medicine

## 2019-04-28 VITALS — BP 120/70 | HR 72 | Temp 96.2°F | Ht 76.0 in | Wt 227.6 lb

## 2019-04-28 DIAGNOSIS — R059 Cough, unspecified: Secondary | ICD-10-CM

## 2019-04-28 DIAGNOSIS — J309 Allergic rhinitis, unspecified: Secondary | ICD-10-CM

## 2019-04-28 DIAGNOSIS — I8393 Asymptomatic varicose veins of bilateral lower extremities: Secondary | ICD-10-CM

## 2019-04-28 DIAGNOSIS — M79652 Pain in left thigh: Secondary | ICD-10-CM

## 2019-04-28 DIAGNOSIS — R05 Cough: Secondary | ICD-10-CM

## 2019-04-28 NOTE — Progress Notes (Signed)
Chief Complaint  Patient presents with  . Leg Pain    5 weeks ago left leg pain and he noticed some swelling in the upper left leg. He then noticed a vein that was swelling that went down his leg. Also has some back pain that he has had for quite some time (low, mid back).   . Cough    Had nagging cough and runny nose x 7 days-Dr. Redmond School sent him for a COVID test and it was negative but still has cough and chest congestion.    Cough, chest congestion and runny nose started over a week ago--had a negative COVID test (done 2/11 or 12, got negative results on 2/13), symptoms persist. Runny nose is clear.  Cough is mostly dry/nonproductive, every once in a while he gets up phlegm, doesn't look at it. Denies pain with breathing or shortness of breath.  At first he felt a catch with a deep breath, but that resolved.  Using Mucinex DM twice daily just prn, not regularly, and it helps. He has some allergies.  10-14 days ago he had runny nose and sore throat, figured it was allergies, but then progressed to cough.  Only tried Claritin once or twice, can't tell if it helped.  No h/o asthma or COPD. Occasional cigar smoker, no cigarettes.  He is also complaining of L medial thigh pain.  He tried icing it and using elliptical to try and "work it out".  He then saw a large vein on his L shin, so got concerned, presents for evaluation.  He is worried about blood clots.   The L thigh pain started 5 weeks ago.  No known injury or change in activity.  Never noticed a bruise or any swelling.  He then started using the elliptical regularly--didn't make a difference. No pain with exercising. He became aware of a prominent vein that started at the front of the shin, up to the knee cap.  Never extended to the medial thigh where the pain is. No change to the medial thigh pain during this time. No other known prominent veins. He describes the discomfort as a 4/10 in intensity.  He gives h/o LBP x years.  Pain much  worse, burning, after a lot of yardwork (chainsaw x 45 minutes). Denies any known radiation down into the buttock or leg, or any weakness.    PMH, PSH, SH reviewed  Outpatient Encounter Medications as of 04/28/2019  Medication Sig  . dextromethorphan-guaiFENesin (MUCINEX DM) 30-600 MG 12hr tablet Take 1 tablet by mouth 2 (two) times daily.  . hydrochlorothiazide (HYDRODIURIL) 12.5 MG tablet Take 1 tablet (12.5 mg total) by mouth daily.  . Multiple Vitamins-Minerals (EMERGEN-C IMMUNE) PACK Take 1 Package by mouth daily.  Marland Kitchen olmesartan (BENICAR) 20 MG tablet Take 1 tablet (20 mg total) by mouth daily.  Marland Kitchen omega-3 acid ethyl esters (LOVAZA) 1 g capsule Please specify directions, refills and quantity  . rosuvastatin (CRESTOR) 40 MG tablet TAKE 1 TABLET BY MOUTH EVERY DAY   No facility-administered encounter medications on file as of 04/28/2019.   No Known Allergies  ROS:  Upper respiratory complaints as per HPI.  No fever, chills, GI or GU complaints.  No bleeding, bruising, rashes. +pain L mid-portion of medial thigh, and LBP per HPI.  No shortness of breath, chest pain, leg swelling.     PHYSICAL EXAM:  BP 120/70   Pulse 72   Temp (!) 96.2 F (35.7 C) (Other (Comment))   Ht 6\' 4"  (1.93  m)   Wt 227 lb 9.6 oz (103.2 kg)   BMI 27.70 kg/m   Pleasant, well-appearing male in no distress. HEENT: conjunctiva and sclera are clear, EOMI.  Nasal mucosa is only mildly edematous, no purulence or erythema. Sinuses are nontender. OP is clear TM's and EAC are normal bilaterally Neck: no lymphadenopathy, thyromegaly or mass Heart: regular rate and rhythm Lungs: clear bilaterally, no wheezes, rales, ronchi Back: no spinal tenderness Extremities: 2+ pulses.  No edema. Small varicosities on both posterior calves, and 1 at left anterior shin. No palpable vein, cord, or other abnormality at his area of discomfort at medial thigh.  Minimal reproducible tenderness in this area. Neuro: alert and  oriented, cranial nerves intact.  Normal strength, DTR's, neg SLR. Skin: no bruising, rash, normal turgor Psych: normal mood, affect, hygiene and grooming   ASSESSMENT/PLAN:  Cough - normal exam, reassured. Suspect related to allergies/PND. Claritin and Mucinex DM, f/u if persists/worsens for further eval  Allergic rhinitis, unspecified seasonality, unspecified trigger - discussed regular use of Claritin, daily, as well as Mucinex (DM, since also coughing). No e/o bacterial infection  Left thigh pain - Dd reviewed--varicose veins (not able to see on exam) vs muscular strain vs lumbar radiculopathy. Discussed stretching, heat  Varicose veins of both lower extremities, unspecified whether complicated - mild.  Trial of compression socks (though if suspecting medial thigh pain contrib, might need thigh high); consider eval by [facility] if persists/worsens    CPE with JCL due next month

## 2019-04-28 NOTE — Patient Instructions (Addendum)
Your lungs were clear today. Try taking claritin once daily, along with the 12 hour Mucinex DM twice daily for at least a week, to see if your symptoms improve.  I suspect possible allergy and postnasal drainage contributing to your symptoms.  There is no evidence of blood clot in your leg. You do have some small varicose veins. Try wearing compression socks. Be sure to do regular stretches (as shown, after exercise). If you have ongoing discomfort or worsening of the swollen veins, we can refer you to the vein doctors. You can likely also schedule an evaluation yourself ([facility])  It is possible that your thigh pain may be related to a strain, or could also be related to your back pain. If you aren't improving with the above measures, consider physical therapy referral for your back and leg pain.  Let us know if you have a preferred location.

## 2019-05-26 ENCOUNTER — Telehealth: Payer: Self-pay | Admitting: Family Medicine

## 2019-05-26 DIAGNOSIS — H10829 Rosacea conjunctivitis, unspecified eye: Secondary | ICD-10-CM | POA: Insufficient documentation

## 2019-05-26 MED ORDER — DOXYCYCLINE HYCLATE 100 MG PO TABS: 100.0000 mg | ORAL_TABLET | Freq: Two times a day (BID) | ORAL | 0 refills | Status: DC

## 2019-05-26 NOTE — Telephone Encounter (Signed)
Pt left message stating he was to have a REFILL rx for doxycycline at pharmacy. He sates it is not at pharmacy. Chart was reviewed and I do not see anything about that, either in Dr. Johnsie Kindred or JCL's notes. Sending message back to both providers as I am unsure of who prescribed. Pt can be reached at [phone]

## 2019-05-26 NOTE — Telephone Encounter (Signed)
He does have a history of rosacea usually in and around the eyes and rarely uses doxycycline.  He states usually 3 or 4 days worth of therapy helps this.  We will go ahead and call some doxycycline and

## 2019-05-26 NOTE — Telephone Encounter (Signed)
Never prescribed by me or mentioned by me. Looks like he used to take it chronically, though not recently

## 2019-06-14 ENCOUNTER — Other Ambulatory Visit: Payer: Self-pay | Admitting: Family Medicine

## 2019-06-14 DIAGNOSIS — E785 Hyperlipidemia, unspecified: Secondary | ICD-10-CM

## 2019-06-15 ENCOUNTER — Other Ambulatory Visit: Payer: Self-pay | Admitting: Family Medicine

## 2019-06-15 DIAGNOSIS — I10 Essential (primary) hypertension: Secondary | ICD-10-CM

## 2019-06-26 ENCOUNTER — Other Ambulatory Visit: Payer: Self-pay

## 2019-06-26 ENCOUNTER — Ambulatory Visit (INDEPENDENT_AMBULATORY_CARE_PROVIDER_SITE_OTHER): Payer: 59 | Admitting: Family Medicine

## 2019-06-26 ENCOUNTER — Encounter: Payer: Self-pay | Admitting: Family Medicine

## 2019-06-26 VITALS — BP 102/64 | HR 77 | Temp 97.7°F | Ht 75.75 in | Wt 224.8 lb

## 2019-06-26 DIAGNOSIS — I10 Essential (primary) hypertension: Secondary | ICD-10-CM

## 2019-06-26 DIAGNOSIS — Z860100 Personal history of colon polyps, unspecified: Secondary | ICD-10-CM

## 2019-06-26 DIAGNOSIS — E785 Hyperlipidemia, unspecified: Secondary | ICD-10-CM

## 2019-06-26 DIAGNOSIS — Z0000 Encounter for general adult medical examination without abnormal findings: Secondary | ICD-10-CM | POA: Diagnosis not present

## 2019-06-26 DIAGNOSIS — J309 Allergic rhinitis, unspecified: Secondary | ICD-10-CM | POA: Diagnosis not present

## 2019-06-26 LAB — CBC WITH DIFFERENTIAL/PLATELET
Basophils Absolute: 0 10*3/uL (ref 0.0–0.2)
Basos: 0 %
EOS (ABSOLUTE): 0.1 10*3/uL (ref 0.0–0.4)
Eos: 2 %
Hematocrit: 47.5 % (ref 37.5–51.0)
Hemoglobin: 16.4 g/dL (ref 13.0–17.7)
Immature Grans (Abs): 0 10*3/uL (ref 0.0–0.1)
Immature Granulocytes: 1 %
Lymphocytes Absolute: 1.1 10*3/uL (ref 0.7–3.1)
Lymphs: 14 %
MCH: 30.7 pg (ref 26.6–33.0)
MCHC: 34.5 g/dL (ref 31.5–35.7)
MCV: 89 fL (ref 79–97)
Monocytes Absolute: 0.6 10*3/uL (ref 0.1–0.9)
Monocytes: 8 %
Neutrophils Absolute: 6 10*3/uL (ref 1.4–7.0)
Neutrophils: 75 %
Platelets: 232 10*3/uL (ref 150–450)
RBC: 5.34 x10E6/uL (ref 4.14–5.80)
RDW: 12.7 % (ref 11.6–15.4)
WBC: 7.9 10*3/uL (ref 3.4–10.8)

## 2019-06-26 LAB — LIPID PANEL
Chol/HDL Ratio: 4.6 ratio (ref 0.0–5.0)
Cholesterol, Total: 147 mg/dL (ref 100–199)
HDL: 32 mg/dL — ABNORMAL LOW (ref >39)
LDL Chol Calc (NIH): 89 mg/dL (ref 0–99)
Triglycerides: 145 mg/dL (ref 0–149)
VLDL Cholesterol Cal: 26 mg/dL (ref 5–40)

## 2019-06-26 LAB — COMPREHENSIVE METABOLIC PANEL
ALT: 26 IU/L (ref 0–44)
AST: 27 IU/L (ref 0–40)
Albumin/Globulin Ratio: 2.2 (ref 1.2–2.2)
Albumin: 4.8 g/dL (ref 3.8–4.9)
Alkaline Phosphatase: 75 IU/L (ref 39–117)
BUN/Creatinine Ratio: 16 (ref 10–24)
BUN: 15 mg/dL (ref 8–27)
Bilirubin Total: 0.7 mg/dL (ref 0.0–1.2)
CO2: 25 mmol/L (ref 20–29)
Calcium: 9.8 mg/dL (ref 8.6–10.2)
Chloride: 101 mmol/L (ref 96–106)
Creatinine, Ser: 0.94 mg/dL (ref 0.76–1.27)
GFR calc Af Amer: 101 mL/min/{1.73_m2} (ref >59)
GFR calc non Af Amer: 88 mL/min/{1.73_m2} (ref >59)
Globulin, Total: 2.2 g/dL (ref 1.5–4.5)
Glucose: 110 mg/dL — ABNORMAL HIGH (ref 65–99)
Potassium: 4.2 mmol/L (ref 3.5–5.2)
Sodium: 142 mmol/L (ref 134–144)
Total Protein: 7 g/dL (ref 6.0–8.5)

## 2019-06-26 MED ORDER — OLMESARTAN MEDOXOMIL-HCTZ 20-12.5 MG PO TABS: 1.0000 | ORAL_TABLET | Freq: Every day | ORAL | 3 refills | Status: DC

## 2019-06-26 MED ORDER — ROSUVASTATIN CALCIUM 40 MG PO TABS: 40.0000 mg | ORAL_TABLET | Freq: Every day | ORAL | 3 refills | Status: DC

## 2019-06-26 NOTE — Progress Notes (Signed)
   Subjective:    Patient ID: Dylan Fischer, male    DOB: [birth year], 59 y.o.   MRN: [id number]  HPI He is here for complete examination.  He continues on his blood pressure medications and is having no difficulty with that.  He is also taking Lovaza generic as well as Crestor 40 mg.  He has had no muscle aches or pains with that.  He does have difficulty with his allergies and is using Allegra.  He is considering using a nasal steroid spray.  He does have a history of adenomatous colonic polyps and was scheduled for colonoscopy several years ago but did not follow-up.  He does plan to follow-up on that with Dr. Henrene Pastor.  He does smoke an occasional cigar.  His alcohol consumption is not of any major concern.  He is retired.  He does keep himself physically active.  His marriage is going well.  Otherwise family and social history as well as health maintenance and immunizations was reviewed   Review of Systems  All other systems reviewed and are negative.      Objective:   Physical Exam Alert and in no distress. Tympanic membranes and canals are normal. Pharyngeal area is normal. Neck is supple without adenopathy or thyromegaly. Cardiac exam shows a regular sinus rhythm without murmurs or gallops. Lungs are clear to auscultation. Abdominal exam shows no masses or tenderness with normal bowel sounds       Assessment & Plan:  Routine general medical examination at a health care facility - Plan: CBC with Differential/Platelet, Lipid panel, Comprehensive metabolic panel, POCT Urinalysis DIP (Proadvantage Device)  Hyperlipidemia, unspecified hyperlipidemia type - Plan: Lipid panel, rosuvastatin (CRESTOR) 40 MG tablet    I will hold Lovaza for the time being to see what his numbers look like. Allergic rhinitis, unspecified seasonality, unspecified trigger     Recommend adding Nasacort to his allergy regimen.   History of colonic polyps     He will call Dr. Henrene Pastor to set up for colonoscopy.  Essential hypertension, benign - Plan: CBC with Differential/Platelet, Comprehensive metabolic panel, olmesartan-hydrochlorothiazide (BENICAR HCT) 20-12.5 MG tablet    Set up to come back in 1 month for Shingrix vaccine.

## 2019-06-30 LAB — HGB A1C W/O EAG: Hgb A1c MFr Bld: 5.6 % (ref 4.8–5.6)

## 2019-06-30 LAB — SPECIMEN STATUS REPORT

## 2019-07-28 ENCOUNTER — Other Ambulatory Visit (INDEPENDENT_AMBULATORY_CARE_PROVIDER_SITE_OTHER): Payer: 59

## 2019-07-28 ENCOUNTER — Other Ambulatory Visit: Payer: Self-pay

## 2019-07-28 DIAGNOSIS — Z23 Encounter for immunization: Secondary | ICD-10-CM | POA: Diagnosis not present

## 2019-10-01 ENCOUNTER — Other Ambulatory Visit: Payer: Self-pay

## 2019-10-01 ENCOUNTER — Telehealth: Payer: Self-pay | Admitting: *Deleted

## 2019-10-01 ENCOUNTER — Other Ambulatory Visit (INDEPENDENT_AMBULATORY_CARE_PROVIDER_SITE_OTHER): Payer: 59

## 2019-10-01 DIAGNOSIS — Z23 Encounter for immunization: Secondary | ICD-10-CM | POA: Diagnosis not present

## 2019-10-01 DIAGNOSIS — L718 Other rosacea: Secondary | ICD-10-CM

## 2019-10-01 DIAGNOSIS — H10829 Rosacea conjunctivitis, unspecified eye: Secondary | ICD-10-CM

## 2019-10-01 MED ORDER — DOXYCYCLINE HYCLATE 100 MG PO TABS: 100.0000 mg | ORAL_TABLET | Freq: Two times a day (BID) | ORAL | 2 refills | Status: DC

## 2019-10-01 NOTE — Telephone Encounter (Signed)
Patient advised.

## 2019-10-01 NOTE — Telephone Encounter (Signed)
Patient was in for 2nd shingles shot, he asked if I could send you a message. He is asking for refill on doxycycline tab as he just met his deductible. He is asking if you would please call in an rx for several months. Uses [facility]. Thanks.

## 2019-10-01 NOTE — Telephone Encounter (Signed)
I called in the doxycycline.  My note states that 3 to 4 days of this usually takes care of it so I gave him 26 with a couple refills which should take care of it

## 2020-06-06 ENCOUNTER — Other Ambulatory Visit: Payer: Self-pay | Admitting: Family Medicine

## 2020-06-06 DIAGNOSIS — E785 Hyperlipidemia, unspecified: Secondary | ICD-10-CM

## 2020-06-06 DIAGNOSIS — I10 Essential (primary) hypertension: Secondary | ICD-10-CM

## 2020-06-07 ENCOUNTER — Other Ambulatory Visit: Payer: Self-pay

## 2020-06-07 DIAGNOSIS — I10 Essential (primary) hypertension: Secondary | ICD-10-CM

## 2020-06-07 MED ORDER — OLMESARTAN MEDOXOMIL-HCTZ 20-12.5 MG PO TABS: 1.0000 | ORAL_TABLET | Freq: Every day | ORAL | 0 refills | Status: DC

## 2020-07-07 ENCOUNTER — Other Ambulatory Visit: Payer: Self-pay | Admitting: Family Medicine

## 2020-07-07 DIAGNOSIS — E785 Hyperlipidemia, unspecified: Secondary | ICD-10-CM

## 2020-08-12 ENCOUNTER — Other Ambulatory Visit: Payer: Self-pay

## 2020-08-12 ENCOUNTER — Ambulatory Visit (INDEPENDENT_AMBULATORY_CARE_PROVIDER_SITE_OTHER): Payer: 59 | Admitting: Family Medicine

## 2020-08-12 ENCOUNTER — Encounter: Payer: Self-pay | Admitting: Family Medicine

## 2020-08-12 ENCOUNTER — Ambulatory Visit
Admission: RE | Admit: 2020-08-12 | Discharge: 2020-08-12 | Disposition: A | Discharge status: Home / Self Care | Payer: 59 | Source: Ambulatory Visit | Attending: Family Medicine | Admitting: Family Medicine

## 2020-08-12 VITALS — BP 112/76 | HR 61 | Temp 97.2°F | Ht 75.75 in | Wt 221.2 lb

## 2020-08-12 DIAGNOSIS — Z0000 Encounter for general adult medical examination without abnormal findings: Secondary | ICD-10-CM | POA: Diagnosis not present

## 2020-08-12 DIAGNOSIS — E785 Hyperlipidemia, unspecified: Secondary | ICD-10-CM

## 2020-08-12 DIAGNOSIS — E739 Lactose intolerance, unspecified: Secondary | ICD-10-CM

## 2020-08-12 DIAGNOSIS — H10829 Rosacea conjunctivitis, unspecified eye: Secondary | ICD-10-CM | POA: Diagnosis not present

## 2020-08-12 DIAGNOSIS — M545 Low back pain, unspecified: Secondary | ICD-10-CM

## 2020-08-12 DIAGNOSIS — Z125 Encounter for screening for malignant neoplasm of prostate: Secondary | ICD-10-CM

## 2020-08-12 DIAGNOSIS — Z86010 Personal history of colonic polyps: Secondary | ICD-10-CM

## 2020-08-12 DIAGNOSIS — I10 Essential (primary) hypertension: Secondary | ICD-10-CM

## 2020-08-12 DIAGNOSIS — M25512 Pain in left shoulder: Secondary | ICD-10-CM

## 2020-08-12 DIAGNOSIS — G8929 Other chronic pain: Secondary | ICD-10-CM

## 2020-08-12 MED ORDER — OLMESARTAN MEDOXOMIL-HCTZ 20-12.5 MG PO TABS: 1.0000 | ORAL_TABLET | Freq: Every day | ORAL | 0 refills | Status: DC

## 2020-08-12 MED ORDER — ROSUVASTATIN CALCIUM 40 MG PO TABS: 40.0000 mg | ORAL_TABLET | Freq: Every day | ORAL | 3 refills | Status: DC

## 2020-08-12 MED ORDER — DOXYCYCLINE HYCLATE 100 MG PO TABS
100.0000 mg | ORAL_TABLET | Freq: Two times a day (BID) | ORAL | 2 refills | Status: DC
Start: 2020-08-12 — End: 2021-08-16

## 2020-08-12 NOTE — Progress Notes (Signed)
   Subjective:    Patient ID: Dylan Fischer, male    DOB: [birth year], 60 y.o.   MRN: [id number]  HPI He is here for complete examination.  He states that he has a several month history of mid abdominal pain that is intermittent in nature.  He does have a history of lactose intolerance but states that the symptoms are different.  No associated nausea, vomiting, diarrhea.  He cannot relate this to any particular greasy foods and has been concerned about the possibility of gluten.  He does have a history of colonic polyps and was scheduled for repeat several years ago but apparently COVID did interfere with this.  He does have a schedule.  He also has a several year history of mid back pain worse with any kind of physical activity.  He has not had this evaluated in the past.  He also complains of a 1 month history of left shoulder discomfort but no history of overuse or injury.  He continues on Crestor as well as Lovaza for his lipids.  Continues on Benicar HCT and is having no difficulty with that.  Does use a multivitamin.  Does intermittently use doxycycline to help with his rosacea.  He is now retired.  His marriage is going well.  Family and social history as well as health maintenance immunizations was reviewed   Review of Systems  All other systems reviewed and are negative.      Objective:   Physical Exam  Alert and in no distress. Tympanic membranes and canals are normal. Pharyngeal area is normal. Neck is supple without adenopathy or thyromegaly. Cardiac exam shows a regular sinus rhythm without murmurs or gallops. Lungs are clear to auscultation.  Abdominal exam shows no masses or tenderness with normal bowel sounds.  Back exam shows no tenderness to palpation.  Good motion of his back.  DTRs were brisk.  Negative straight leg raising.  Left shoulder exam shows negative sulcus sign.  Full motion of the shoulder.  Drop arm test was negative.  Neer's and Hawkins test negative.         Assessment & Plan:  Encounter for health maintenance examination in adult - Plan: CBC with Differential/Platelet, Comprehensive metabolic panel, Lipid panel  History of colonic polyps  Rosacea blepharoconjunctivitis - Plan: doxycycline (VIBRA-TABS) 100 MG tablet  Screening for prostate cancer - Plan: PSA  Hyperlipidemia, unspecified hyperlipidemia type - Plan: rosuvastatin (CRESTOR) 40 MG tablet, Lipid panel  Essential hypertension, benign - Plan: olmesartan-hydrochlorothiazide (BENICAR HCT) 20-12.5 MG tablet, CBC with Differential/Platelet, Comprehensive metabolic panel  Chronic midline low back pain without sciatica - Plan: DG Lumbar Spine Complete, Ambulatory referral to Physical Therapy  Acute pain of left shoulder - Plan: Ambulatory referral to Physical Therapy  Lactose intolerance  Difficult to say was causing his abdominal pain.  He will try to cut back on gluten-containing products to see if that will help.  If not may need to be referred to GI for further evaluation of the abdominal discomfort.  He is already set up to have a colonoscopy.  Explained the shoulder pain and back pain with him and since there does not seem to be anything that looks surgical, I think conservative care with rehab is appropriate.  He was comfortable with that.

## 2020-08-12 NOTE — Patient Instructions (Signed)
Gluten-Free Diet for Celiac Disease, Adult  The gluten-free diet includes all foods that do not contain gluten. Gluten is a protein that is found in wheat, rye, barley, and some other grains. Following the gluten-free diet is the only treatment for people with celiac disease. It helps to prevent damage to the intestines and improves or eliminates the symptoms of celiac disease. Following the gluten-free diet requires some planning. It can be challenging at first, but it gets easier with time and practice. There are more gluten-free options available today than ever before. If you need help finding gluten-free foods or if you have questions, talk with your dietitian or your health care provider. What are tips for following this plan? Reading food labels Read all food labels. Gluten is often added to foods. Always check the ingredient list and look for warnings, such as "may contain gluten." Foods that list any of these key words on the label usually contain gluten:  Wheat, flour, enriched flour, bromated flour, white flour, durum flour, graham flour, phosphated flour, self-rising flour, semolina, farina, barley (malt), rye, and oats.  Starch, dextrin, modified food starch, or cereal.  Thickening, fillers, or emulsifiers.  Malt flavoring, malt extract, or malt syrup.  Hydrolyzed vegetable protein. In the [place] packaged foods that are gluten-free are required to be labeled "GF." These foods should be easy to identify and are safe to eat. In the [place] food companies are also required to list common food allergens, including wheat, on their labels. Shopping When grocery shopping, start by shopping in the produce, meat, and dairy sections. These sections are more likely to contain gluten-free foods. Then move to the aisles that contain packaged foods if you need to. Meal planning  All fruits, vegetables, and meats are safe to eat and do not contain gluten.  Talk with your dietitian or health care  provider before taking a gluten-free multivitamin or mineral supplement.  Be aware of gluten-free foods having contact with foods that contain gluten (cross-contamination). This can happen at home and with any processed foods. ? Talk with your health care provider or dietitian about how to reduce the risk of cross-contamination in your home. ? If you have questions about how a food is processed, ask the manufacturer. What foods can I eat? Fruits All plain fresh, frozen, canned, and dried fruits, and 100% fruit juices. Vegetables All plain fresh, frozen, and canned vegetables. Grains Amaranth, bean flours, 100% buckwheat flour, corn, millet, nut flours or nut meals, GF oats, quinoa, rice, sorghum, teff, rice wafers, pure cornmeal tortillas, popcorn, and hot cereals made from cornmeal.  Hominy, rice, wild rice. Some Asian rice noodles or bean noodles.  Arrowroot starch, corn bran, corn flour, corn germ, cornmeal, corn starch, potato flour, potato starch flour, and rice bran. Plain, brown, and sweet rice flours. Rice polish, soy flour, and tapioca starch. Meats and other protein foods All fresh beef, pork, poultry, fish, seafood, and eggs. Fish canned in water, oil, brine, or vegetable broth. Plain nuts and seeds, peanut butter.  Some precooked or cured meat, such as sausages or meat loaves. Some frankfurters. Dried beans, dried peas, and lentils. Dairy Fresh plain, dry, evaporated, or condensed milk. Cream, butter, sour cream, whipping cream, and most yogurts. Unprocessed cheese, most processed cheeses, some cottage cheeses, some cream cheeses. Beverages Coffee, tea, most herbal teas. Carbonated beverages and some root beers. Wine, sake, and distilled spirits, such as gin, vodka, and whiskey. Most hard ciders. Fats and oils Butter, margarine, vegetable oil, hydrogenated butter,  olive oil, shortening, lard, cream, and some mayonnaise. Some commercial salad dressings. Olives. Sweets and  desserts Sugar, honey, some syrups, molasses, jelly, and jam. Plain hard candy, marshmallows, and gumdrops.  Pure cocoa powder. Plain chocolate. Custard and some pudding mixes. Gelatin desserts, sorbets, frozen ice pops, and sherbet.  Cake, cookies, and other desserts prepared with allowed flours. Some commercial ice creams. Cornstarch, tapioca, and rice puddings. Seasoning and other foods Some canned or frozen soups. Monosodium glutamate (MSG). Cider, rice, and wine vinegar. Baking soda and baking powder.  Cream of tartar. Baking and nutritional yeast. Certain soy sauces made without wheat (ask your dietitian about specific brands that are allowed).  Nuts, coconut, and chocolate. Salt, pepper, herbs, spices, flavoring extracts, imitation or artificial flavorings, natural flavorings, and food colorings.  Some medicines and supplements. Rice syrups. The items listed above may not be a complete list of foods and beverages you can eat. Contact a dietitian for more information. What foods should I avoid? Fruits Thickened or prepared fruits and some pie fillings. Some fruit snacks and fruit roll-ups. Vegetables Most creamed vegetables and most vegetables canned in sauces. Some commercially prepared vegetables and salads. Vegetables in a soy sauce marinade or dressing. Grains Barley, bran, bulgur, couscous, cracked wheat, [place], farro, graham, malt, matzo, semolina, wheat germ, and all wheat and rye cereals including spelt and kamut.  Cereals containing malt as a flavoring, such as rice cereal. Noodles, spaghetti, macaroni, most packaged rice mixes, and all mixes containing wheat, rye, barley, or triticale. Meats and other protein foods Any meat or meat alternative containing wheat, rye, barley, or gluten stabilizers. These are often marinated or packaged meats, and precooked or cured meat, such as sausages or meat loaves. Bread-containing products, such as Swiss steak, croquettes, meatballs, and  meatloaf. Most tuna canned in vegetable broth and [place] with hydrolyzed vegetable protein (HVP) injected as part of the basting. Seitan. Imitation fish. Eggs in sauces made from ingredients to avoid. Dairy Commercial chocolate milk drinks and malted milk. Some non-dairy creamers. Any cheese product containing ingredients to avoid. Beverages Certain cereal beverages. Beer, ale, malted milk, and some root beers. Some hard ciders. Some instant flavored coffees. Some herbal teas made with barley or with barley malt added. Fats and oils Some commercial salad dressings. Sour cream containing modified food starch. Sweets and desserts Some toffees. Chocolate-coated nuts (may be rolled in wheat flour) and some commercial candies and candy bars.  Most cakes, cookies, donuts, pastries, and other baked goods. Some commercial ice cream. Ice cream cones.  Commercially prepared mixes for cakes, cookies, and other desserts. Bread pudding and other puddings thickened with flour.  Products containing brown rice syrup made with barley malt enzyme. Desserts and sweets made with malt flavoring. Seasoning and other foods Some curry powders, some dry seasoning mixes, some gravy extracts, some meat sauces, some ketchups, some prepared mustards, and horseradish.  Certain soy sauces. Malt vinegar. Bouillon and bouillon cubes that contain HVP. Some chip dips, and some chewing gum.  Yeast extract. Brewer's yeast. Caramel color.  Some medicines and supplements. The items listed above may not be a complete list of foods and beverages you should avoid. Contact a dietitian for more information. Summary  Gluten is a protein that is found in wheat, rye, barley, and some other grains. The gluten-free diet includes all foods that do not contain gluten.  If you need help finding gluten-free foods or if you have questions, talk with your dietitian or your health care provider.  Read all food labels. Gluten is often added to  foods. Always check the ingredient list and look for warnings, such as "may contain gluten." This information is not intended to replace advice given to you by your health care provider. Make sure you discuss any questions you have with your health care provider. Document Revised: 03/03/2019 Document Reviewed: 03/03/2019 Elsevier Patient Education  2021 [employer].

## 2020-08-13 LAB — CBC WITH DIFFERENTIAL/PLATELET
Basophils Absolute: 0 10*3/uL (ref 0.0–0.2)
Basos: 0 %
EOS (ABSOLUTE): 0.1 10*3/uL (ref 0.0–0.4)
Eos: 1 %
Hematocrit: 44.3 % (ref 37.5–51.0)
Hemoglobin: 14.6 g/dL (ref 13.0–17.7)
Immature Grans (Abs): 0 10*3/uL (ref 0.0–0.1)
Immature Granulocytes: 1 %
Lymphocytes Absolute: 0.9 10*3/uL (ref 0.7–3.1)
Lymphs: 12 %
MCH: 28.2 pg (ref 26.6–33.0)
MCHC: 33 g/dL (ref 31.5–35.7)
MCV: 86 fL (ref 79–97)
Monocytes Absolute: 0.5 10*3/uL (ref 0.1–0.9)
Monocytes: 7 %
Neutrophils Absolute: 6.1 10*3/uL (ref 1.4–7.0)
Neutrophils: 79 %
Platelets: 241 10*3/uL (ref 150–450)
RBC: 5.17 x10E6/uL (ref 4.14–5.80)
RDW: 12.8 % (ref 11.6–15.4)
WBC: 7.7 10*3/uL (ref 3.4–10.8)

## 2020-08-13 LAB — COMPREHENSIVE METABOLIC PANEL
ALT: 18 IU/L (ref 0–44)
AST: 17 IU/L (ref 0–40)
Albumin/Globulin Ratio: 1.9 (ref 1.2–2.2)
Albumin: 4.5 g/dL (ref 3.8–4.8)
Alkaline Phosphatase: 85 IU/L (ref 44–121)
BUN/Creatinine Ratio: 17 (ref 10–24)
BUN: 17 mg/dL (ref 8–27)
Bilirubin Total: 0.5 mg/dL (ref 0.0–1.2)
CO2: 24 mmol/L (ref 20–29)
Calcium: 9.3 mg/dL (ref 8.6–10.2)
Chloride: 101 mmol/L (ref 96–106)
Creatinine, Ser: 0.99 mg/dL (ref 0.76–1.27)
Globulin, Total: 2.4 g/dL (ref 1.5–4.5)
Glucose: 112 mg/dL — ABNORMAL HIGH (ref 65–99)
Potassium: 4 mmol/L (ref 3.5–5.2)
Sodium: 141 mmol/L (ref 134–144)
Total Protein: 6.9 g/dL (ref 6.0–8.5)
eGFR: 87 mL/min/{1.73_m2} (ref >59)

## 2020-08-13 LAB — PSA: Prostate Specific Ag, Serum: 2.6 ng/mL (ref 0.0–4.0)

## 2020-08-13 LAB — LIPID PANEL
Chol/HDL Ratio: 4.9 ratio (ref 0.0–5.0)
Cholesterol, Total: 148 mg/dL (ref 100–199)
HDL: 30 mg/dL — ABNORMAL LOW (ref >39)
LDL Chol Calc (NIH): 90 mg/dL (ref 0–99)
Triglycerides: 158 mg/dL — ABNORMAL HIGH (ref 0–149)
VLDL Cholesterol Cal: 28 mg/dL (ref 5–40)

## 2020-08-20 ENCOUNTER — Other Ambulatory Visit: Payer: Self-pay

## 2020-08-20 DIAGNOSIS — Z86010 Personal history of colonic polyps: Secondary | ICD-10-CM

## 2020-08-20 DIAGNOSIS — R1084 Generalized abdominal pain: Secondary | ICD-10-CM

## 2020-09-03 ENCOUNTER — Other Ambulatory Visit: Payer: Self-pay | Admitting: Family Medicine

## 2020-09-03 DIAGNOSIS — I10 Essential (primary) hypertension: Secondary | ICD-10-CM

## 2020-09-06 ENCOUNTER — Other Ambulatory Visit: Payer: Self-pay | Admitting: Family Medicine

## 2020-09-06 DIAGNOSIS — E785 Hyperlipidemia, unspecified: Secondary | ICD-10-CM

## 2020-09-10 HISTORY — PX: COLONOSCOPY WITH PROPOFOL: SHX5780

## 2020-09-11 ENCOUNTER — Other Ambulatory Visit: Payer: Self-pay | Admitting: Family Medicine

## 2020-09-11 DIAGNOSIS — I10 Essential (primary) hypertension: Secondary | ICD-10-CM

## 2020-09-21 ENCOUNTER — Ambulatory Visit (AMBULATORY_SURGERY_CENTER): Payer: 59 | Admitting: *Deleted

## 2020-09-21 ENCOUNTER — Other Ambulatory Visit: Payer: Self-pay

## 2020-09-21 VITALS — Ht 76.0 in | Wt 220.0 lb

## 2020-09-21 DIAGNOSIS — Z86010 Personal history of colonic polyps: Secondary | ICD-10-CM

## 2020-09-21 MED ORDER — PLENVU 140 G PO SOLR: 1.0000 | ORAL | 0 refills | Status: AC

## 2020-09-21 NOTE — Progress Notes (Signed)
  No trouble with anesthesia, denies trouble moving neck, or hx/fam hx of malignant hyperthermia per pt   No egg or soy allergy  No home oxygen use   No medications for weight loss taken  Pt denies constipation issues  Pt informed that we do not do prior authorizations for prep Plenvu coupon given and code put into RX

## 2020-10-05 ENCOUNTER — Other Ambulatory Visit (INDEPENDENT_AMBULATORY_CARE_PROVIDER_SITE_OTHER): Payer: 59

## 2020-10-05 ENCOUNTER — Other Ambulatory Visit: Payer: Self-pay

## 2020-10-05 ENCOUNTER — Encounter: Payer: Self-pay | Admitting: Internal Medicine

## 2020-10-05 ENCOUNTER — Ambulatory Visit (AMBULATORY_SURGERY_CENTER): Payer: 59 | Admitting: Internal Medicine

## 2020-10-05 VITALS — BP 124/82 | HR 67 | Temp 97.7°F | Resp 12 | Ht 76.0 in | Wt 220.4 lb

## 2020-10-05 DIAGNOSIS — K6389 Other specified diseases of intestine: Secondary | ICD-10-CM

## 2020-10-05 DIAGNOSIS — K635 Polyp of colon: Secondary | ICD-10-CM

## 2020-10-05 DIAGNOSIS — D125 Benign neoplasm of sigmoid colon: Secondary | ICD-10-CM

## 2020-10-05 DIAGNOSIS — C183 Malignant neoplasm of hepatic flexure: Secondary | ICD-10-CM

## 2020-10-05 DIAGNOSIS — R1032 Left lower quadrant pain: Secondary | ICD-10-CM | POA: Diagnosis not present

## 2020-10-05 DIAGNOSIS — D123 Benign neoplasm of transverse colon: Secondary | ICD-10-CM

## 2020-10-05 DIAGNOSIS — Z86010 Personal history of colonic polyps: Secondary | ICD-10-CM

## 2020-10-05 LAB — COMPREHENSIVE METABOLIC PANEL
ALT: 19 U/L (ref 0–53)
AST: 19 U/L (ref 0–37)
Albumin: 4.2 g/dL (ref 3.5–5.2)
Alkaline Phosphatase: 77 U/L (ref 39–117)
BUN: 9 mg/dL (ref 6–23)
CO2: 27 mEq/L (ref 19–32)
Calcium: 8.7 mg/dL (ref 8.4–10.5)
Chloride: 106 mEq/L (ref 96–112)
Creatinine, Ser: 0.91 mg/dL (ref 0.40–1.50)
GFR: 90.81 mL/min (ref >60.00)
Glucose, Bld: 89 mg/dL (ref 70–99)
Potassium: 4 mEq/L (ref 3.5–5.1)
Sodium: 141 mEq/L (ref 135–145)
Total Bilirubin: 0.5 mg/dL (ref 0.2–1.2)
Total Protein: 7 g/dL (ref 6.0–8.3)

## 2020-10-05 LAB — PROTIME-INR
INR: 1.1 ratio — ABNORMAL HIGH (ref 0.8–1.0)
Prothrombin Time: 12.8 s (ref 9.6–13.1)

## 2020-10-05 LAB — CBC
HCT: 38.9 % — ABNORMAL LOW (ref 39.0–52.0)
Hemoglobin: 12.9 g/dL — ABNORMAL LOW (ref 13.0–17.0)
MCHC: 33.1 g/dL (ref 30.0–36.0)
MCV: 82.5 fl (ref 78.0–100.0)
Platelets: 260 10*3/uL (ref 150.0–400.0)
RBC: 4.72 Mil/uL (ref 4.22–5.81)
RDW: 14.3 % (ref 11.5–15.5)
WBC: 9.8 10*3/uL (ref 4.0–10.5)

## 2020-10-05 MED ORDER — SODIUM CHLORIDE 0.9 % IV SOLN: 500.0000 mL | Freq: Once | INTRAVENOUS | Status: DC

## 2020-10-05 NOTE — Progress Notes (Signed)
pt tolerated well. VSS. awake and to recovery. Report given to RN.  

## 2020-10-05 NOTE — Patient Instructions (Signed)
Please read handouts provided. Continue present medications. Await pathology results.   YOU HAD AN ENDOSCOPIC PROCEDURE TODAY AT THE [facility]:   Refer to the procedure report that was given to you for any specific questions about what was found during the examination.  If the procedure report does not answer your questions, please call your gastroenterologist to clarify.  If you requested that your care partner not be given the details of your procedure findings, then the procedure report has been included in a sealed envelope for you to review at your convenience later.  YOU SHOULD EXPECT: Some feelings of bloating in the abdomen. Passage of more gas than usual.  Walking can help get rid of the air that was put into your GI tract during the procedure and reduce the bloating. If you had a lower endoscopy (such as a colonoscopy or flexible sigmoidoscopy) you may notice spotting of blood in your stool or on the toilet paper. If you underwent a bowel prep for your procedure, you may not have a normal bowel movement for a few days.  Please Note:  You might notice some irritation and congestion in your nose or some drainage.  This is from the oxygen used during your procedure.  There is no need for concern and it should clear up in a day or so.  SYMPTOMS TO REPORT IMMEDIATELY:  Following lower endoscopy (colonoscopy or flexible sigmoidoscopy):  Excessive amounts of blood in the stool  Significant tenderness or worsening of abdominal pains  Swelling of the abdomen that is new, acute  Fever of 100F or higher   For urgent or emergent issues, a gastroenterologist can be reached at any hour by calling [phone]. Do not use MyChart messaging for urgent concerns.    DIET:  We do recommend a small meal at first, but then you may proceed to your regular diet.  Drink plenty of fluids but you should avoid alcoholic beverages for 24 hours.  ACTIVITY:  You should plan to take it easy  for the rest of today and you should NOT DRIVE or use heavy machinery until tomorrow (because of the sedation medicines used during the test).    FOLLOW UP: Our staff will call the number listed on your records 48-72 hours following your procedure to check on you and address any questions or concerns that you may have regarding the information given to you following your procedure. If we do not reach you, we will leave a message.  We will attempt to reach you two times.  During this call, we will ask if you have developed any symptoms of COVID 19. If you develop any symptoms (ie: fever, flu-like symptoms, shortness of breath, cough etc.) before then, please call [phone].  If you test positive for Covid 19 in the 2 weeks post procedure, please call and report this information to us.    If any biopsies were taken you will be contacted by phone or by letter within the next 1-3 weeks.  Please call us at [phone] if you have not heard about the biopsies in 3 weeks.    SIGNATURES/CONFIDENTIALITY: You and/or your care partner have signed paperwork which will be entered into your electronic medical record.  These signatures attest to the fact that that the information above on your After Visit Summary has been reviewed and is understood.  Full responsibility of the confidentiality of this discharge information lies with you and/or your care-partner.  

## 2020-10-05 NOTE — Op Note (Signed)
[facility] Patient Name: Dylan Fischer Procedure Date: 10/05/2020 2:36 PM MRN: [id number] Endoscopist: Docia Chuck. Henrene Pastor , MD Age: 60 Referring MD:  Date of Birth: 26-May-1960 Gender: Male Account #: [id number] Procedure:                Colonoscopy with cold snare polypectomy x 2; with                            biopsies; submucosal injection Indications:              High risk colon cancer surveillance: Personal                            history of non-advanced adenoma. Index examination                            April 2012. Overdue for follow-up. Reports a                            several month history of persistent left lower                            quadrant discomfort. Medicines:                Monitored Anesthesia Care Procedure:                Pre-Anesthesia Assessment:                           - Prior to the procedure, a History and Physical                            was performed, and patient medications and                            allergies were reviewed. The patient's tolerance of                            previous anesthesia was also reviewed. The risks                            and benefits of the procedure and the sedation                            options and risks were discussed with the patient.                            All questions were answered, and informed consent                            was obtained. Prior Anticoagulants: The patient has                            taken no previous anticoagulant or antiplatelet  agents. ASA Grade Assessment: II - A patient with                            mild systemic disease. After reviewing the risks                            and benefits, the patient was deemed in                            satisfactory condition to undergo the procedure.                           After obtaining informed consent, the colonoscope                            was passed under direct vision.  Throughout the                            procedure, the patient's blood pressure, pulse, and                            oxygen saturations were monitored continuously. The                            Colonoscope was introduced through the anus and                            advanced to the the proximal colon. Not advanced                            beyond secondary to mass. See below.. The rectum                            was photographed. The quality of the bowel                            preparation was excellent. The colonoscopy was                            performed without difficulty. The patient tolerated                            the procedure well. The bowel preparation used was                            Prepopik via split dose instruction. Scope In: 2:53:49 PM Scope Out: 3:21:02 PM Total Procedure Duration: 0 hours 27 minutes 13 seconds  Findings:                 A near completely obstructing large mass was found                            at the region hepatic flexure. The malignant  appearing mass was friable and circumferential. The                            endoscope could not be passed proximal to this                            region. This was biopsied with a cold forceps for                            histology. Area was tattooed with an injection of                            Spot (carbon black) just distal (downstream toward                            the rectum) to the lesion.                           Two polyps were found in the sigmoid colon and                            transverse colon. The polyps were 3 to 6 mm in size.                           The exam was otherwise without abnormality on                            direct and retroflexion views. Complications:            No immediate complications. Estimated blood loss:                            None. Estimated Blood Loss:     Estimated blood loss: none. 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 Biopsied. Tattooed. The                            colonoscope could not be passed proximal to the                            mass.                           - Two 3 to 6 mm polyps in the sigmoid colon and in                            the transverse colon.                           - The examination was otherwise normal on direct  and retroflexion views. Recommendation:           1. Laboratories today including CBC, comprehensive                            metabolic panel, PT/INR, CEA level                           2. Please schedule contrast-enhanced CT scan of the                            chest, abdomen, and pelvis "proximal colon cancer,                            rule out metastatic disease"                           3. Await pathology results                           4. General surgical consultation "colon cancer, for                            right hemicolectomy" tinue present medications.                           5. Refer patient to GI oncology nurse navigator                           6. Likely follow-up surveillance colonoscopy in 1                            year. Docia Chuck. Henrene Pastor, MD 10/05/2020 3:35:07 PM This report has been signed electronically.

## 2020-10-05 NOTE — Progress Notes (Signed)
Riki Sheer, LPN - [facility]  Pt's states no medical or surgical changes since previsit or office visit.    Pt reported he would like to speak to Dr. Henrene Pastor about having LLQ abdominal pain for approximately 5 months.  He says pain in pretty much constant.  The pain does vary from sharp pain to dull pain.  He questions if possibility of Diverticulitis.  I asked pt to speak with Dr. Henrene Pastor before sedation in the procedure room.  Pt said he would. maw

## 2020-10-06 ENCOUNTER — Other Ambulatory Visit: Payer: Self-pay

## 2020-10-06 ENCOUNTER — Telehealth: Payer: Self-pay

## 2020-10-06 DIAGNOSIS — K6389 Other specified diseases of intestine: Secondary | ICD-10-CM

## 2020-10-06 DIAGNOSIS — C189 Malignant neoplasm of colon, unspecified: Secondary | ICD-10-CM

## 2020-10-06 LAB — CEA: CEA: 0.5 ng/mL

## 2020-10-06 NOTE — Telephone Encounter (Signed)
CT of CAP ordered, rad scheduling to contact pt with appt. Ihor Gully GI RN Navigator notified of referral on pt, [facility] faxed referral for colon cancer center for right hemicolectomy. Referral in epic for oncology also.

## 2020-10-07 ENCOUNTER — Telehealth: Payer: Self-pay | Admitting: *Deleted

## 2020-10-07 NOTE — Telephone Encounter (Signed)
Notified patient that his referral has been received from [facility]. Informed him that if his CT scan on 8/2 is clear of any cancer, besides in the colon, he will go to surgery 1st and then see medical oncology afterwards and treatment needs is based on pathology results of tumor size, lymph node status. RN will call him after CT scan is noted regarding appointment (per Dr. Benay Spice). He understands and agrees to this plan. He has not heard from surgery yet.

## 2020-10-07 NOTE — Telephone Encounter (Signed)
If he is eating without pain, regular diet is fine

## 2020-10-07 NOTE — Telephone Encounter (Signed)
  Follow up Call-  Call back number 10/05/2020  Post procedure Call Back phone  # [phone] cell  Permission to leave phone message Yes  Some recent data might be hidden     Patient questions:  Do you have a fever, pain , or abdominal swelling? No. Pain Score  0 *  Have you tolerated food without any problems? Yes.    Have you been able to return to your normal activities? Yes.    Do you have any questions about your discharge instructions: Diet   No. Medications  No. Follow up visit  No.  Do you have questions or concerns about your Care? Yes  Actions: * If pain score is 4 or above: No action needed, pain <4.  Dr. Henrene Pastor,  On follow up call this morning, pt asked about his diet.  He is concerned that the mass found on exam is nearly obstructing.  Is he ok to follow his normal diet or should he restrict certain foods?

## 2020-10-07 NOTE — Telephone Encounter (Signed)
Returned pts call and advised him to return to regular diet.  Encouraged him to call us should he have any issues with tolerating this or if he has any pain.

## 2020-10-08 ENCOUNTER — Telehealth (HOSPITAL_COMMUNITY): Payer: Self-pay

## 2020-10-12 ENCOUNTER — Ambulatory Visit (HOSPITAL_COMMUNITY): Payer: 59

## 2020-10-15 ENCOUNTER — Ambulatory Visit (HOSPITAL_COMMUNITY)
Admission: RE | Admit: 2020-10-15 | Discharge: 2020-10-15 | Disposition: A | Discharge status: Home / Self Care | Payer: 59 | Source: Ambulatory Visit | Attending: Internal Medicine | Admitting: Internal Medicine

## 2020-10-15 ENCOUNTER — Other Ambulatory Visit: Payer: Self-pay

## 2020-10-15 DIAGNOSIS — C189 Malignant neoplasm of colon, unspecified: Secondary | ICD-10-CM | POA: Diagnosis present

## 2020-10-15 DIAGNOSIS — K6389 Other specified diseases of intestine: Secondary | ICD-10-CM | POA: Diagnosis not present

## 2020-10-15 MED ORDER — IOHEXOL 350 MG/ML SOLN
100.0000 mL | Freq: Once | INTRAVENOUS | Status: AC | PRN
  Administered 2020-10-15: 80 mL via INTRAVENOUS

## 2020-10-20 ENCOUNTER — Ambulatory Visit: Payer: Self-pay | Admitting: Surgery

## 2020-10-21 DIAGNOSIS — D126 Benign neoplasm of colon, unspecified: Secondary | ICD-10-CM | POA: Insufficient documentation

## 2020-10-21 DIAGNOSIS — C189 Malignant neoplasm of colon, unspecified: Secondary | ICD-10-CM | POA: Insufficient documentation

## 2020-10-21 NOTE — Progress Notes (Signed)
DUE TO COVID-19 ONLY ONE VISITOR IS ALLOWED TO COME WITH YOU AND STAY IN THE WAITING ROOM ONLY DURING PRE OP AND PROCEDURE DAY OF SURGERY. THE 1 VISITOR  MAY VISIT WITH YOU AFTER SURGERY IN YOUR PRIVATE ROOM DURING VISITING HOURS ONLY!  YOU NEED TO HAVE A COVID 19 TEST ON__  11/08/2020 _____ '@_______'$ , THIS TEST MUST BE DONE BEFORE SURGERY,  COVID TESTING SITE [place] [id number], IT IS ON THE RIGHT GOING OUT [address] APPROXIMATELY  2 MINUTES PAST [employer] ON THE RIGHT. ONCE YOUR COVID TEST IS COMPLETED,  PLEASE BEGIN THE QUARANTINE INSTRUCTIONS AS OUTLINED IN YOUR HANDOUT.                Dylan Fischer  10/21/2020   Your procedure is scheduled on:  11/11/2020   Report to [facility] Main  Entrance   Report to admitting at    [phone]     Call this number if you have problems the morning of surgery [phone]           REMEMBER: FOLLOW ALL YOUR BOWEL PREP INSTRUCTIONS WITH CLEAR LIQUIDS FROM YOUR SURGEON'S INSTRUCTIONS. DRINK 2 PRESURGERY ENSURE DRINKS THE NIGHT BEFORE SURGERY AT 1000 PM AND 1 PRESURGERY DRINK THE DAY OF THE PROCEDURE 3 HOURS PRIOR TO SCHEDULED SURGERY.  NOTHING BY MOUTH EXCEPT CLEAR LIQUIDS UNTIL THREE HOURS PRIOR TO SCHEDULED SURGERY. PLEASE FINISH PRESURGERY 3RD  ENSURE  DRINK PER SURGEON ORDER 3 HOURS PRIOR TO SCHEDULED SURGERY TIME WHICH NEEDS TO BE COMPLETED AT __  0430 am _______.     CLEAR LIQUID DIET   Foods Allowed                                                                      Coffee and tea, regular and decaf                           Plain Jell-O any favor except red or purple                                         Fruit ices (not with fruit pulp)                                      Iced Popsicles                                     Carbonated beverages, regular and diet                                    Cranberry, grape and apple juices Sports drinks like Gatorade Lightly seasoned clear broth or  consume(fat free) Sugar, honey syrup  _____________________________________________________________________      BRUSH YOUR TEETH MORNING OF SURGERY AND RINSE YOUR MOUTH OUT, NO CHEWING GUM CANDY OR MINTS.  Take these medicines the morning of surgery with A SIP OF WATER:    None  DO NOT TAKE ANY DIABETIC MEDICATIONS DAY OF YOUR SURGERY                               You may not have any metal on your body including hair pins and              piercings  Do not wear jewelry, make-up, lotions, powders or perfumes, deodorant             Do not wear nail polish on your fingernails.  Do not shave  48 hours prior to surgery.              Men may shave face and neck.   Do not bring valuables to the hospital. [place].  Contacts, dentures or bridgework may not be worn into surgery.  Leave suitcase in the car. After surgery it may be brought to your room.                 Please read over the following fact sheets you were given: _____________________________________________________________________  [facility] - Preparing for Surgery Before surgery, you can play an important role.  Because skin is not sterile, your skin needs to be as free of germs as possible.  You can reduce the number of germs on your skin by washing with CHG (chlorahexidine gluconate) soap before surgery.  CHG is an antiseptic cleaner which kills germs and bonds with the skin to continue killing germs even after washing. Please DO NOT use if you have an allergy to CHG or antibacterial soaps.  If your skin becomes reddened/irritated stop using the CHG and inform your nurse when you arrive at Short Stay. Do not shave (including legs and underarms) for at least 48 hours prior to the first CHG shower.  You may shave your face/neck. Please follow these instructions carefully:  1.  Shower with CHG Soap the night before surgery and the  morning of Surgery.  2.  If you choose to  wash your hair, wash your hair first as usual with your  normal  shampoo.  3.  After you shampoo, rinse your hair and body thoroughly to remove the  shampoo.                           4.  Use CHG as you would any other liquid soap.  You can apply chg directly  to the skin and wash                       Gently with a scrungie or clean washcloth.  5.  Apply the CHG Soap to your body ONLY FROM THE NECK DOWN.   Do not use on face/ open                           Wound or open sores. Avoid contact with eyes, ears mouth and genitals (private parts).                       Wash face,  Genitals (private parts) with your normal soap.  6.  Wash thoroughly, paying special attention to the area where your surgery  will be performed.  7.  Thoroughly rinse your body with warm water from the neck down.  8.  DO NOT shower/wash with your normal soap after using and rinsing off  the CHG Soap.                9.  Pat yourself dry with a clean towel.            10.  Wear clean pajamas.            11.  Place clean sheets on your bed the night of your first shower and do not  sleep with pets. Day of Surgery : Do not apply any lotions/deodorants the morning of surgery.  Please wear clean clothes to the hospital/surgery center.  FAILURE TO FOLLOW THESE INSTRUCTIONS MAY RESULT IN THE CANCELLATION OF YOUR SURGERY PATIENT SIGNATURE_________________________________  NURSE SIGNATURE__________________________________  ________________________________________________________________________

## 2020-10-26 ENCOUNTER — Other Ambulatory Visit: Payer: Self-pay

## 2020-10-26 ENCOUNTER — Encounter (HOSPITAL_COMMUNITY): Payer: Self-pay

## 2020-10-26 ENCOUNTER — Encounter (HOSPITAL_COMMUNITY)
Admission: RE | Admit: 2020-10-26 | Discharge: 2020-10-26 | Disposition: A | Discharge status: Home / Self Care | Payer: 59 | Source: Ambulatory Visit | Attending: Surgery | Admitting: Surgery

## 2020-10-26 DIAGNOSIS — Z01818 Encounter for other preprocedural examination: Secondary | ICD-10-CM | POA: Insufficient documentation

## 2020-10-26 HISTORY — DX: Malignant (primary) neoplasm, unspecified: C80.1

## 2020-10-26 LAB — BASIC METABOLIC PANEL
Anion gap: 7 (ref 5–15)
BUN: 13 mg/dL (ref 8–23)
CO2: 28 mmol/L (ref 22–32)
Calcium: 9.1 mg/dL (ref 8.9–10.3)
Chloride: 103 mmol/L (ref 98–111)
Creatinine, Ser: 0.82 mg/dL (ref 0.61–1.24)
GFR, Estimated: >60 mL/min (ref >60)
Glucose, Bld: 98 mg/dL (ref 70–99)
Potassium: 3.9 mmol/L (ref 3.5–5.1)
Sodium: 138 mmol/L (ref 135–145)

## 2020-10-26 LAB — CBC
HCT: 41.3 % (ref 39.0–52.0)
Hemoglobin: 13.2 g/dL (ref 13.0–17.0)
MCH: 27.4 pg (ref 26.0–34.0)
MCHC: 32 g/dL (ref 30.0–36.0)
MCV: 85.9 fL (ref 80.0–100.0)
Platelets: 264 10*3/uL (ref 150–400)
RBC: 4.81 MIL/uL (ref 4.22–5.81)
RDW: 13.6 % (ref 11.5–15.5)
WBC: 7 10*3/uL (ref 4.0–10.5)
nRBC: 0 % (ref 0.0–0.2)

## 2020-10-26 LAB — HEMOGLOBIN A1C
Hgb A1c MFr Bld: 6 % — ABNORMAL HIGH (ref 4.8–5.6)
Mean Plasma Glucose: 125.5 mg/dL

## 2020-10-26 NOTE — Progress Notes (Addendum)
Anesthesia Review:  PCP: Dr Jill Alexanders LOV 08/12/20  Cardiologist : Chest x-ray : CT chest- 10/17/20  EKG :10/26/20  Echo : Stress test: Cardiac Cath :  Activity level: can do a flight of stairs without difficulty  Sleep Study/ CPAP : none  Fasting Blood Sugar :      / Checks Blood Sugar -- times a day:   Blood Thinner/ Instructions /Last Dose: ASA / Instructions/ Last Dose :  Covid test-11/08/20   Hgba1c-10/26/20-6.0

## 2020-10-27 ENCOUNTER — Other Ambulatory Visit: Payer: Self-pay

## 2020-10-27 NOTE — Progress Notes (Signed)
The proposed treatment discussed in conference is for discussion purpose only and is not a binding recommendation.  The patients have not been physically examined, or presented with their treatment options.  Therefore, final treatment plans cannot be decided.  

## 2020-11-08 ENCOUNTER — Other Ambulatory Visit: Payer: Self-pay | Admitting: Surgery

## 2020-11-09 LAB — SARS CORONAVIRUS 2 (TAT 6-24 HRS): SARS Coronavirus 2: NEGATIVE

## 2020-11-10 NOTE — Anesthesia Preprocedure Evaluation (Addendum)
Anesthesia Evaluation  Patient identified by MRN, date of birth, ID band Patient awake    Reviewed: Allergy & Precautions, NPO status , Patient's Chart, lab work & pertinent test results  History of Anesthesia Complications Negative for: history of anesthetic complications  Airway Mallampati: II  TM Distance: >3 FB Neck ROM: Full    Dental no notable dental hx. (+) Dental Advisory Given   Pulmonary neg pulmonary ROS, former smoker,    Pulmonary exam normal        Cardiovascular hypertension, Pt. on medications Normal cardiovascular exam     Neuro/Psych negative neurological ROS     GI/Hepatic negative GI ROS, Neg liver ROS,   Endo/Other  negative endocrine ROS  Renal/GU negative Renal ROS     Musculoskeletal negative musculoskeletal ROS (+)   Abdominal   Peds  Hematology negative hematology ROS (+)   Anesthesia Other Findings   Reproductive/Obstetrics                            Anesthesia Physical Anesthesia Plan  ASA: 2  Anesthesia Plan: General   Post-op Pain Management:    Induction: Intravenous  PONV Risk Score and Plan: Ondansetron, Midazolam, Scopolamine patch - Pre-op, Treatment may vary due to age or medical condition and Dexamethasone  Airway Management Planned: Oral ETT  Additional Equipment:   Intra-op Plan:   Post-operative Plan: Extubation in OR  Informed Consent: I have reviewed the patients History and Physical, chart, labs and discussed the procedure including the risks, benefits and alternatives for the proposed anesthesia with the patient or authorized representative who has indicated his/her understanding and acceptance.     Dental advisory given  Plan Discussed with: Anesthesiologist and CRNA  Anesthesia Plan Comments:        Anesthesia Quick Evaluation

## 2020-11-11 ENCOUNTER — Encounter (HOSPITAL_COMMUNITY)
Admission: RE | Disposition: A | Discharge status: Home / Self Care | Payer: Self-pay | Source: Home / Self Care | Attending: Surgery

## 2020-11-11 ENCOUNTER — Encounter (HOSPITAL_COMMUNITY): Payer: Self-pay | Admitting: Surgery

## 2020-11-11 ENCOUNTER — Inpatient Hospital Stay (HOSPITAL_COMMUNITY): Payer: 59 | Admitting: Emergency Medicine

## 2020-11-11 ENCOUNTER — Other Ambulatory Visit: Payer: Self-pay

## 2020-11-11 ENCOUNTER — Inpatient Hospital Stay (HOSPITAL_COMMUNITY)
Admission: RE | Admit: 2020-11-11 | Discharge: 2020-11-13 | DRG: 330 | Disposition: A | Discharge status: Home / Self Care | Payer: 59 | Attending: Surgery | Admitting: Surgery

## 2020-11-11 ENCOUNTER — Inpatient Hospital Stay (HOSPITAL_COMMUNITY): Payer: 59 | Admitting: Certified Registered Nurse Anesthetist

## 2020-11-11 DIAGNOSIS — K769 Liver disease, unspecified: Secondary | ICD-10-CM | POA: Diagnosis present

## 2020-11-11 DIAGNOSIS — Z823 Family history of stroke: Secondary | ICD-10-CM | POA: Diagnosis not present

## 2020-11-11 DIAGNOSIS — C184 Malignant neoplasm of transverse colon: Principal | ICD-10-CM | POA: Diagnosis present

## 2020-11-11 DIAGNOSIS — I10 Essential (primary) hypertension: Secondary | ICD-10-CM | POA: Diagnosis present

## 2020-11-11 DIAGNOSIS — Z87891 Personal history of nicotine dependence: Secondary | ICD-10-CM

## 2020-11-11 DIAGNOSIS — C182 Malignant neoplasm of ascending colon: Secondary | ICD-10-CM | POA: Diagnosis present

## 2020-11-11 DIAGNOSIS — Z8261 Family history of arthritis: Secondary | ICD-10-CM | POA: Diagnosis not present

## 2020-11-11 DIAGNOSIS — C189 Malignant neoplasm of colon, unspecified: Secondary | ICD-10-CM | POA: Diagnosis present

## 2020-11-11 DIAGNOSIS — R599 Enlarged lymph nodes, unspecified: Secondary | ICD-10-CM | POA: Diagnosis present

## 2020-11-11 DIAGNOSIS — Z8719 Personal history of other diseases of the digestive system: Secondary | ICD-10-CM

## 2020-11-11 DIAGNOSIS — E785 Hyperlipidemia, unspecified: Secondary | ICD-10-CM | POA: Diagnosis present

## 2020-11-11 DIAGNOSIS — Z825 Family history of asthma and other chronic lower respiratory diseases: Secondary | ICD-10-CM

## 2020-11-11 DIAGNOSIS — Z8249 Family history of ischemic heart disease and other diseases of the circulatory system: Secondary | ICD-10-CM

## 2020-11-11 DIAGNOSIS — Z83438 Family history of other disorder of lipoprotein metabolism and other lipidemia: Secondary | ICD-10-CM | POA: Diagnosis not present

## 2020-11-11 DIAGNOSIS — Z803 Family history of malignant neoplasm of breast: Secondary | ICD-10-CM | POA: Diagnosis not present

## 2020-11-11 DIAGNOSIS — Z85038 Personal history of other malignant neoplasm of large intestine: Secondary | ICD-10-CM | POA: Diagnosis present

## 2020-11-11 DIAGNOSIS — L719 Rosacea, unspecified: Secondary | ICD-10-CM | POA: Diagnosis present

## 2020-11-11 HISTORY — PX: LAPAROSCOPIC RIGHT HEMI COLECTOMY: SHX5926

## 2020-11-11 LAB — ABO/RH: ABO/RH(D): O POS

## 2020-11-11 LAB — TYPE AND SCREEN
ABO/RH(D): O POS
Antibody Screen: NEGATIVE

## 2020-11-11 SURGERY — LAPAROSCOPIC RIGHT HEMI COLECTOMY
Anesthesia: General | Site: Abdomen

## 2020-11-11 SURGERY — LAPAROSCOPIC RIGHT HEMI COLECTOMY
Anesthesia: General

## 2020-11-11 MED ORDER — ONDANSETRON HCL 4 MG/2ML IJ SOLN
INTRAMUSCULAR | Status: DC | PRN
  Administered 2020-11-11: 4 mg via INTRAVENOUS

## 2020-11-11 MED ORDER — ROSUVASTATIN CALCIUM 20 MG PO TABS
40.0000 mg | ORAL_TABLET | Freq: Every day | ORAL | Status: DC
  Administered 2020-11-12 – 2020-11-13 (×2): 40 mg via ORAL
  Filled 2020-11-11 (×2): qty 2

## 2020-11-11 MED ORDER — CHLORHEXIDINE GLUCONATE 0.12 % MT SOLN
15.0000 mL | Freq: Once | OROMUCOSAL | Status: AC
  Administered 2020-11-11: 15 mL via OROMUCOSAL

## 2020-11-11 MED ORDER — IBUPROFEN 400 MG PO TABS: 600.0000 mg | ORAL_TABLET | Freq: Four times a day (QID) | ORAL | Status: DC | PRN

## 2020-11-11 MED ORDER — POLYETHYLENE GLYCOL 3350 17 GM/SCOOP PO POWD: 1.0000 | Freq: Once | ORAL | Status: DC

## 2020-11-11 MED ORDER — BISACODYL 5 MG PO TBEC: 20.0000 mg | DELAYED_RELEASE_TABLET | Freq: Once | ORAL | Status: DC

## 2020-11-11 MED ORDER — MIDAZOLAM HCL 5 MG/5ML IJ SOLN
INTRAMUSCULAR | Status: DC | PRN
  Administered 2020-11-11: 2 mg via INTRAVENOUS

## 2020-11-11 MED ORDER — KETAMINE HCL 10 MG/ML IJ SOLN
INTRAMUSCULAR | Status: DC | PRN
  Administered 2020-11-11: 30 mg via INTRAVENOUS

## 2020-11-11 MED ORDER — BUPIVACAINE LIPOSOME 1.3 % IJ SUSP
INTRAMUSCULAR | Status: DC | PRN
  Administered 2020-11-11: 20 mL

## 2020-11-11 MED ORDER — ROCURONIUM BROMIDE 10 MG/ML (PF) SYRINGE
PREFILLED_SYRINGE | INTRAVENOUS | Status: DC | PRN
  Administered 2020-11-11: 100 mg via INTRAVENOUS
  Administered 2020-11-11: 20 mg via INTRAVENOUS

## 2020-11-11 MED ORDER — ACETAMINOPHEN 500 MG PO TABS
1000.0000 mg | ORAL_TABLET | ORAL | Status: AC
  Administered 2020-11-11: 1000 mg via ORAL
  Filled 2020-11-11: qty 2

## 2020-11-11 MED ORDER — MIDAZOLAM HCL 2 MG/2ML IJ SOLN
INTRAMUSCULAR | Status: AC
  Filled 2020-11-11: qty 2

## 2020-11-11 MED ORDER — FENTANYL CITRATE PF 50 MCG/ML IJ SOSY
PREFILLED_SYRINGE | INTRAMUSCULAR | Status: AC
  Filled 2020-11-11: qty 2

## 2020-11-11 MED ORDER — SCOPOLAMINE 1 MG/3DAYS TD PT72
MEDICATED_PATCH | TRANSDERMAL | Status: DC | PRN
  Administered 2020-11-11: 1 via TRANSDERMAL

## 2020-11-11 MED ORDER — ONDANSETRON HCL 4 MG/2ML IJ SOLN: 4.0000 mg | Freq: Four times a day (QID) | INTRAMUSCULAR | Status: DC | PRN

## 2020-11-11 MED ORDER — HYDRALAZINE HCL 20 MG/ML IJ SOLN: 10.0000 mg | INTRAMUSCULAR | Status: DC | PRN

## 2020-11-11 MED ORDER — ORAL CARE MOUTH RINSE: 15.0000 mL | Freq: Once | OROMUCOSAL | Status: AC

## 2020-11-11 MED ORDER — ALVIMOPAN 12 MG PO CAPS
12.0000 mg | ORAL_CAPSULE | ORAL | Status: AC
  Administered 2020-11-11: 12 mg via ORAL
  Filled 2020-11-11: qty 1

## 2020-11-11 MED ORDER — NEOMYCIN SULFATE 500 MG PO TABS: 1000.0000 mg | ORAL_TABLET | ORAL | Status: DC

## 2020-11-11 MED ORDER — HEPARIN SODIUM (PORCINE) 5000 UNIT/ML IJ SOLN
5000.0000 [IU] | Freq: Three times a day (TID) | INTRAMUSCULAR | Status: DC
  Administered 2020-11-11 – 2020-11-13 (×5): 5000 [IU] via SUBCUTANEOUS
  Filled 2020-11-11 (×5): qty 1

## 2020-11-11 MED ORDER — AMISULPRIDE (ANTIEMETIC) 5 MG/2ML IV SOLN: 10.0000 mg | Freq: Once | INTRAVENOUS | Status: DC | PRN

## 2020-11-11 MED ORDER — ROCURONIUM BROMIDE 10 MG/ML (PF) SYRINGE
PREFILLED_SYRINGE | INTRAVENOUS | Status: AC
  Filled 2020-11-11: qty 10

## 2020-11-11 MED ORDER — LIDOCAINE 2% (20 MG/ML) 5 ML SYRINGE
INTRAMUSCULAR | Status: AC
  Filled 2020-11-11: qty 5

## 2020-11-11 MED ORDER — MELATONIN 3 MG PO TABS
3.0000 mg | ORAL_TABLET | Freq: Every evening | ORAL | Status: DC | PRN
  Administered 2020-11-11: 3 mg via ORAL
  Filled 2020-11-11: qty 1

## 2020-11-11 MED ORDER — ENSURE PRE-SURGERY PO LIQD: 296.0000 mL | Freq: Once | ORAL | Status: DC

## 2020-11-11 MED ORDER — FENTANYL CITRATE PF 50 MCG/ML IJ SOSY
25.0000 ug | PREFILLED_SYRINGE | INTRAMUSCULAR | Status: DC | PRN
  Administered 2020-11-11 (×2): 50 ug via INTRAVENOUS

## 2020-11-11 MED ORDER — BUPIVACAINE LIPOSOME 1.3 % IJ SUSP
20.0000 mL | Freq: Once | INTRAMUSCULAR | Status: DC
Start: 2020-11-11 — End: 2020-11-11

## 2020-11-11 MED ORDER — HYDROMORPHONE HCL 1 MG/ML IJ SOLN: 0.5000 mg | INTRAMUSCULAR | Status: DC | PRN

## 2020-11-11 MED ORDER — SODIUM CHLORIDE 0.9 % IR SOLN
Status: DC | PRN
  Administered 2020-11-11: 2000 mL

## 2020-11-11 MED ORDER — BUPIVACAINE LIPOSOME 1.3 % IJ SUSP
INTRAMUSCULAR | Status: AC
  Filled 2020-11-11: qty 20

## 2020-11-11 MED ORDER — SUGAMMADEX SODIUM 200 MG/2ML IV SOLN
INTRAVENOUS | Status: DC | PRN
  Administered 2020-11-11: 300 mg via INTRAVENOUS

## 2020-11-11 MED ORDER — ENSURE SURGERY PO LIQD
237.0000 mL | Freq: Two times a day (BID) | ORAL | Status: DC
  Administered 2020-11-11 – 2020-11-13 (×3): 237 mL via ORAL

## 2020-11-11 MED ORDER — BUPIVACAINE-EPINEPHRINE (PF) 0.25% -1:200000 IJ SOLN
INTRAMUSCULAR | Status: AC
  Filled 2020-11-11: qty 30

## 2020-11-11 MED ORDER — PROMETHAZINE HCL 25 MG/ML IJ SOLN: 6.2500 mg | INTRAMUSCULAR | Status: DC | PRN

## 2020-11-11 MED ORDER — DEXAMETHASONE SODIUM PHOSPHATE 10 MG/ML IJ SOLN
INTRAMUSCULAR | Status: AC
  Filled 2020-11-11: qty 1

## 2020-11-11 MED ORDER — PROPOFOL 10 MG/ML IV BOLUS
INTRAVENOUS | Status: DC | PRN
  Administered 2020-11-11: 200 mg via INTRAVENOUS

## 2020-11-11 MED ORDER — LACTATED RINGERS IR SOLN
Status: DC | PRN
  Administered 2020-11-11: 1000 mL

## 2020-11-11 MED ORDER — FENTANYL CITRATE (PF) 100 MCG/2ML IJ SOLN
INTRAMUSCULAR | Status: DC | PRN
  Administered 2020-11-11: 100 ug via INTRAVENOUS
  Administered 2020-11-11 (×3): 50 ug via INTRAVENOUS
  Administered 2020-11-11: 100 ug via INTRAVENOUS

## 2020-11-11 MED ORDER — LIDOCAINE 2% (20 MG/ML) 5 ML SYRINGE
INTRAMUSCULAR | Status: DC | PRN
  Administered 2020-11-11: 100 mg via INTRAVENOUS

## 2020-11-11 MED ORDER — SIMETHICONE 80 MG PO CHEW: 40.0000 mg | CHEWABLE_TABLET | Freq: Four times a day (QID) | ORAL | Status: DC | PRN

## 2020-11-11 MED ORDER — LACTATED RINGERS IV SOLN: INTRAVENOUS | Status: DC

## 2020-11-11 MED ORDER — PHENYLEPHRINE 40 MCG/ML (10ML) SYRINGE FOR IV PUSH (FOR BLOOD PRESSURE SUPPORT)
PREFILLED_SYRINGE | INTRAVENOUS | Status: DC | PRN
  Administered 2020-11-11 (×2): 120 ug via INTRAVENOUS
  Administered 2020-11-11: 200 ug via INTRAVENOUS

## 2020-11-11 MED ORDER — SCOPOLAMINE 1 MG/3DAYS TD PT72
MEDICATED_PATCH | TRANSDERMAL | Status: AC
  Filled 2020-11-11: qty 1

## 2020-11-11 MED ORDER — ENSURE PRE-SURGERY PO LIQD: 592.0000 mL | Freq: Once | ORAL | Status: DC

## 2020-11-11 MED ORDER — SODIUM CHLORIDE 0.9 % IV SOLN
2.0000 g | INTRAVENOUS | Status: AC
  Administered 2020-11-11: 2 g via INTRAVENOUS
  Filled 2020-11-11: qty 2

## 2020-11-11 MED ORDER — DIPHENHYDRAMINE HCL 12.5 MG/5ML PO ELIX: 12.5000 mg | ORAL_SOLUTION | Freq: Four times a day (QID) | ORAL | Status: DC | PRN

## 2020-11-11 MED ORDER — CHLORHEXIDINE GLUCONATE CLOTH 2 % EX PADS: 6.0000 | MEDICATED_PAD | Freq: Once | CUTANEOUS | Status: DC

## 2020-11-11 MED ORDER — ALUM & MAG HYDROXIDE-SIMETH 200-200-20 MG/5ML PO SUSP: 30.0000 mL | Freq: Four times a day (QID) | ORAL | Status: DC | PRN

## 2020-11-11 MED ORDER — PHENYLEPHRINE 40 MCG/ML (10ML) SYRINGE FOR IV PUSH (FOR BLOOD PRESSURE SUPPORT)
PREFILLED_SYRINGE | INTRAVENOUS | Status: AC
  Filled 2020-11-11: qty 10

## 2020-11-11 MED ORDER — HEPARIN SODIUM (PORCINE) 5000 UNIT/ML IJ SOLN
5000.0000 [IU] | Freq: Once | INTRAMUSCULAR | Status: AC
  Administered 2020-11-11: 5000 [IU] via SUBCUTANEOUS
  Filled 2020-11-11: qty 1

## 2020-11-11 MED ORDER — OLMESARTAN MEDOXOMIL-HCTZ 20-12.5 MG PO TABS: 1.0000 | ORAL_TABLET | Freq: Every day | ORAL | Status: DC

## 2020-11-11 MED ORDER — DEXAMETHASONE SODIUM PHOSPHATE 4 MG/ML IJ SOLN
INTRAMUSCULAR | Status: DC | PRN
  Administered 2020-11-11: 10 mg via INTRAVENOUS

## 2020-11-11 MED ORDER — PROPOFOL 500 MG/50ML IV EMUL
INTRAVENOUS | Status: AC
  Filled 2020-11-11: qty 50

## 2020-11-11 MED ORDER — DIPHENHYDRAMINE HCL 50 MG/ML IJ SOLN: 12.5000 mg | Freq: Four times a day (QID) | INTRAMUSCULAR | Status: DC | PRN

## 2020-11-11 MED ORDER — ACETAMINOPHEN 500 MG PO TABS
1000.0000 mg | ORAL_TABLET | Freq: Four times a day (QID) | ORAL | Status: DC
  Administered 2020-11-11 – 2020-11-13 (×9): 1000 mg via ORAL
  Filled 2020-11-11 (×9): qty 2

## 2020-11-11 MED ORDER — METRONIDAZOLE 500 MG PO TABS: 1000.0000 mg | ORAL_TABLET | ORAL | Status: DC

## 2020-11-11 MED ORDER — LIDOCAINE HCL (PF) 2 % IJ SOLN
INTRAMUSCULAR | Status: DC | PRN
  Administered 2020-11-11: 1.5 mg/kg/h via INTRADERMAL

## 2020-11-11 MED ORDER — LIDOCAINE HCL 2 % IJ SOLN
INTRAMUSCULAR | Status: AC
  Filled 2020-11-11: qty 20

## 2020-11-11 MED ORDER — BUPIVACAINE-EPINEPHRINE 0.25% -1:200000 IJ SOLN
INTRAMUSCULAR | Status: DC | PRN
  Administered 2020-11-11: 30 mL

## 2020-11-11 MED ORDER — ONDANSETRON HCL 4 MG/2ML IJ SOLN
INTRAMUSCULAR | Status: AC
  Filled 2020-11-11: qty 2

## 2020-11-11 MED ORDER — ALVIMOPAN 12 MG PO CAPS
12.0000 mg | ORAL_CAPSULE | Freq: Two times a day (BID) | ORAL | Status: DC
  Administered 2020-11-12 (×2): 12 mg via ORAL
  Filled 2020-11-11 (×3): qty 1

## 2020-11-11 MED ORDER — KETAMINE HCL 10 MG/ML IJ SOLN
INTRAMUSCULAR | Status: AC
  Filled 2020-11-11: qty 1

## 2020-11-11 MED ORDER — TRAMADOL HCL 50 MG PO TABS
50.0000 mg | ORAL_TABLET | Freq: Four times a day (QID) | ORAL | Status: DC | PRN
  Administered 2020-11-12 (×2): 50 mg via ORAL
  Filled 2020-11-11 (×2): qty 1

## 2020-11-11 MED ORDER — ONDANSETRON HCL 4 MG PO TABS: 4.0000 mg | ORAL_TABLET | Freq: Four times a day (QID) | ORAL | Status: DC | PRN

## 2020-11-11 MED ORDER — FENTANYL CITRATE (PF) 250 MCG/5ML IJ SOLN
INTRAMUSCULAR | Status: AC
  Filled 2020-11-11: qty 5

## 2020-11-11 MED ORDER — FENTANYL CITRATE (PF) 100 MCG/2ML IJ SOLN
INTRAMUSCULAR | Status: AC
  Filled 2020-11-11: qty 2

## 2020-11-11 SURGICAL SUPPLY — 57 items
APPLIER CLIP ROT 10 11.4 M/L (STAPLE)
BAG COUNTER SPONGE SURGICOUNT (BAG) IMPLANT
BLADE CLIPPER SURG (BLADE) IMPLANT
CABLE HIGH FREQUENCY MONO STRZ (ELECTRODE) ×4 IMPLANT
CELLS DAT CNTRL 66122 CELL SVR (MISCELLANEOUS) IMPLANT
CHLORAPREP W/TINT 26 (MISCELLANEOUS) ×2 IMPLANT
CLIP APPLIE ROT 10 11.4 M/L (STAPLE) IMPLANT
DECANTER SPIKE VIAL GLASS SM (MISCELLANEOUS) ×2 IMPLANT
DERMABOND ADVANCED (GAUZE/BANDAGES/DRESSINGS) ×1
DERMABOND ADVANCED .7 DNX12 (GAUZE/BANDAGES/DRESSINGS) ×1 IMPLANT
DISSECTOR BLUNT TIP ENDO 5MM (MISCELLANEOUS) IMPLANT
ELECT REM PT RETURN 15FT ADLT (MISCELLANEOUS) ×2 IMPLANT
GAUZE SPONGE 4X4 12PLY STRL (GAUZE/BANDAGES/DRESSINGS) ×2 IMPLANT
GLOVE SURG ENC MOIS LTX SZ7.5 (GLOVE) ×4 IMPLANT
GLOVE SURG NEOPR MICRO LF SZ8 (GLOVE) ×4 IMPLANT
GOWN STRL REUS W/TWL XL LVL3 (GOWN DISPOSABLE) ×8 IMPLANT
KIT TURNOVER KIT A (KITS) ×2 IMPLANT
LIGASURE IMPACT 36 18CM CVD LR (INSTRUMENTS) IMPLANT
NS IRRIG 1000ML POUR BTL (IV SOLUTION) ×4 IMPLANT
PACK COLON (CUSTOM PROCEDURE TRAY) ×2 IMPLANT
PAD POSITIONING PINK XL (MISCELLANEOUS) IMPLANT
PENCIL SMOKE EVACUATOR (MISCELLANEOUS) IMPLANT
PROTECTOR NERVE ULNAR (MISCELLANEOUS) IMPLANT
RELOAD PROXIMATE 75MM BLUE (ENDOMECHANICALS) ×4 IMPLANT
RTRCTR WOUND ALEXIS 18CM MED (MISCELLANEOUS)
SCISSORS LAP 5X35 DISP (ENDOMECHANICALS) ×2 IMPLANT
SEALER TISSUE G2 STRG ARTC 35C (ENDOMECHANICALS) ×2 IMPLANT
SET IRRIG TUBING LAPAROSCOPIC (IRRIGATION / IRRIGATOR) ×2 IMPLANT
SET TUBE SMOKE EVAC HIGH FLOW (TUBING) ×2 IMPLANT
SHEARS HARMONIC ACE PLUS 36CM (ENDOMECHANICALS) IMPLANT
SLEEVE ADV FIXATION 5X100MM (TROCAR) ×4 IMPLANT
SLEEVE ENDOPATH XCEL 5M (ENDOMECHANICALS) ×2 IMPLANT
STAPLER 90 3.5 STAND SLIM (STAPLE) ×2
STAPLER 90 3.5 STD SLIM (STAPLE) ×1 IMPLANT
STAPLER PROXIMATE 75MM BLUE (STAPLE) ×2 IMPLANT
STAPLER VISISTAT 35W (STAPLE) ×2 IMPLANT
SUT MNCRL AB 4-0 PS2 18 (SUTURE) ×2 IMPLANT
SUT PDS AB 1 CT1 27 (SUTURE) ×4 IMPLANT
SUT PROLENE 2 0 CT2 30 (SUTURE) IMPLANT
SUT PROLENE 2 0 KS (SUTURE) IMPLANT
SUT SILK 2 0 (SUTURE) ×2
SUT SILK 2 0 SH CR/8 (SUTURE) ×2 IMPLANT
SUT SILK 2-0 18XBRD TIE 12 (SUTURE) ×1 IMPLANT
SUT SILK 3 0 (SUTURE) ×2
SUT SILK 3 0 SH CR/8 (SUTURE) ×2 IMPLANT
SUT SILK 3-0 18XBRD TIE 12 (SUTURE) ×1 IMPLANT
SYS LAPSCP GELPORT 120MM (MISCELLANEOUS)
SYSTEM LAPSCP GELPORT 120MM (MISCELLANEOUS) IMPLANT
TAPE CLOTH 4X10 WHT NS (GAUZE/BANDAGES/DRESSINGS) ×2 IMPLANT
TOWEL OR 17X26 10 PK STRL BLUE (TOWEL DISPOSABLE) ×2 IMPLANT
TRAY FOLEY MTR SLVR 14FR STAT (SET/KITS/TRAYS/PACK) IMPLANT
TRAY FOLEY MTR SLVR 16FR STAT (SET/KITS/TRAYS/PACK) ×2 IMPLANT
TROCAR ADV FIXATION 5X100MM (TROCAR) ×2 IMPLANT
TROCAR BALLN 12MMX100 BLUNT (TROCAR) IMPLANT
TROCAR XCEL NON-BLD 11X100MML (ENDOMECHANICALS) ×2 IMPLANT
TUBING CONNECTING 10 (TUBING) ×4 IMPLANT
YANKAUER SUCT BULB TIP NO VENT (SUCTIONS) ×2 IMPLANT

## 2020-11-11 NOTE — Transfer of Care (Signed)
Immediate Anesthesia Transfer of Care Note  Patient: Dylan Fischer  Procedure(s) Performed: LAPAROSCOPIC RIGHT HEMI COLECTOMY (Abdomen)  Patient Location: PACU  Anesthesia Type:General  Level of Consciousness: awake, alert , oriented and patient cooperative  Airway & Oxygen Therapy: Patient Spontanous Breathing and Patient connected to face mask  Post-op Assessment: Report given to RN and Post -op Vital signs reviewed and stable  Post vital signs: Reviewed and stable  Last Vitals:  Vitals Value Taken Time  BP 141/62 11/11/20 0953  Temp    Pulse 58 11/11/20 0955  Resp 10 11/11/20 0955  SpO2 100 % 11/11/20 0955  Vitals shown include unvalidated device data.  Last Pain:  Vitals:   11/11/20 0613  TempSrc: Oral  PainSc:          Complications: No notable events documented.

## 2020-11-11 NOTE — H&P (Signed)
CC: Here today for surgery  HPI: Dylan Fischer is an 60 y.o. male with history of HTN, HLD, whom is seen in the office today as a referral by Dr. Redmond School for evaluation of colon cancer.  He reports he had a colonoscopy approximately 9 years ago and believes he had a polyp removed. He underwent colonoscopy with Dr. Henrene Pastor 10/05/2020: 1. Nearly completely obstructing large mass in the region of the hepatic flexure. Could not pass the endoscope passed this lesion. This was biopsied and tattooed just distal to the mass. 2. 2 polyps in the sigmoid and transverse removed.  Pathology: 1. 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 2. Transverse colon-hyperplastic polyp 3. Sigmoid colon-tubular adenoma, no dysplasia or malignancy.  CT chest/abdomen/pelvis 10/15/2020 shows a short segment wall thickening in the hepatic flexure consistent with endoscopic finding. 2. Prominent mesenteric lymph nodes up to 5 mm, nonspecific. 3. Prominent gastrohepatic, hepatoduodenal, and right-sided retroperitoneal nodes measuring up to 7 mm. 4. 2 subcentimeter liver hypodensities measuring up to 6 mm in the right hepatic dome, too small to characterize. 5. Tiny 1 to 2 mm right middle lobe perifissural nodule, nonspecific  CMP 09/2020 Alb 4.2 Hgb 12.9 INR 1.1  CEA 0.5  He denies any complaints today. He is then tolerating a diet without nausea or vomiting. He does report an at least 62-monthhistory of intermittent cramps following certain kinds of foods.  PMH: HTN, HLD  PSH: Denies any prior abdominal or pelvic surgical history  FHx: His mother had breast cancer. He denies any known family history of colorectal, endometrial or ovarian cancer   Social Hx: Denies use of tobacco/EtOH/illicit drug.   Past Medical History:  Diagnosis Date   Cancer ([facility]    Colonic polyp    Hyperlipidemia    Hypertension    Ocular rosacea    on Doxycycline long term     Past Surgical History:  Procedure Laterality Date   BILATERAL LASER VEIN SURGERY ON LEGS      COLONOSCOPY  2012   due 2017.   Dr. PHenrene Pastor pending f/u 06/2015   MOUTH SURGERY      Family History  Problem Relation Age of Onset   Cancer Mother        breast   Arthritis Mother    Hyperlipidemia Mother    Heart disease Mother        68  Hypertension Father    COPD Father    Hyperlipidemia Father    Stroke Father    Aneurysm Father 79  Colon cancer Neg Hx    Esophageal cancer Neg Hx    Rectal cancer Neg Hx    Stomach cancer Neg Hx     Social:  reports that he has quit smoking. His smoking use included cigars. He has never used smokeless tobacco. He reports current alcohol use of about 4.0 standard drinks per week. He reports that he does not use drugs.  Allergies: No Known Allergies  Medications: I have reviewed the patient's current medications.  No results found for this or any previous visit (from the past 48 hour(s)).  No results found.  ROS - all of the below systems have been reviewed with the patient and positives are indicated with bold text General: chills, fever or night sweats Eyes: blurry vision or double vision ENT: epistaxis or sore throat Allergy/Immunology: itchy/watery eyes or nasal congestion Hematologic/Lymphatic: bleeding problems, blood clots or swollen lymph nodes Endocrine: temperature  intolerance or unexpected weight changes Breast: new or changing breast lumps or nipple discharge Resp: cough, shortness of breath, or wheezing CV: chest pain or dyspnea on exertion GI: as per HPI GU: dysuria, trouble voiding, or hematuria MSK: joint pain or joint stiffness Neuro: TIA or stroke symptoms Derm: pruritus and skin lesion changes Psych: anxiety and depression  PE Blood pressure 126/80, pulse 76, temperature 97.8 F (36.6 C), temperature source Oral, resp. rate 18, height '6\' 4"'$  (1.93 m), weight 98.4 kg, SpO2 100 %. Constitutional: NAD;  conversant Eyes: Moist conjunctiva; no lid lag; anicteric Neck: Trachea midline Lungs: Normal respiratory effort CV: RRR; no pitting edema GI: Abd soft, NT/ND; no palpable hepatosplenomegaly MSK: Normal range of motion of extremities; no clubbing/cyanosis Psychiatric: Appropriate affect; alert and oriented x3  No results found for this or any previous visit (from the past 48 hour(s)).  No results found.   A/P: Dylan Fischer is an 60 y.o. male with with hx of HTN, HLD here for evaluation of newly diagnosed hepatic flexure adenocarcinoma  Colonoscopy - path shows lesion to be poorly differentiated with signet ring features  CT CAP 10/15/20 -thickening with adjacent stranding at the hepatic flexure. Prominent mesenteric lymph nodes up to 5 mm, nonspecific. Prominent gastrohepatic, hepatoduodenal and right-sided retroperitoneal nodes up to 7 mm, nonspecific. 2 subcentimeter hypodensities 6 mm in size in the right dome of the liver, too small to accurately characterize.  -Genetics referral given mother with breast and now him with colon cancer -The anatomy and physiology of the GI tract was reviewed with the patient. The pathophysiology of colon polyps and cancer was discussed as well with associated pictures. -We have discussed various different treatment options going forward including surgery (the most definitive) to address this - laparoscopic right hemicolectomy -The planned procedure, material risks (including, but not limited to, pain, bleeding, infection, scarring, need for blood transfusion, damage to surrounding structures- blood vessels/nerves/viscus/organs, damage to ureter, urine leak, leak from anastomosis, need for additional procedures, scenarios where a stoma may be necessary and where it could be permanent, worsening of pre-existing medical conditions, hernia, recurrence, pneumonia, heart attack, stroke, death) benefits and alternatives to surgery were discussed at length. The  patient's questions were answered to his satisfaction, he voiced understanding and elected to proceed with surgery. Additionally, we discussed typical postoperative expectations and the recovery process.  Nadeen Landau, MD [facility] Use [email or web address] to contact on call provider

## 2020-11-11 NOTE — Op Note (Signed)
PATIENT: Dylan Fischer  60 y.o. 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 Nallely Yost, MD  ASSISTANT: Ashten Boston, MD  ANESTHESIA: General endotracheal  EBL: 20 mL Total I/O In: 1600 [I.V.:1500; IV Piggyback:100] Out: 125 [Urine:100; Blood:25]  DRAINS: None  SPECIMEN: Right colon (including terminal ileum, appendix, right colon)  COUNTS: Sponge, needle and instrument counts were reported correct x2  FINDINGS: Tattoo at hepatic flexure at location of large mass in proximal transverse colon. A small nodule of omentum was also encountered vs appendix epiploicae and included as separate specimen - was from region of mass. No evident metastatic disease on visceral parietal peritoneum or liver.  NARRATIVE:  The patient was identified & brought into the operating room, placed supine on the operating table and SCDs were applied to the lower extremities. General endotracheal anesthesia was induced. He was positioned supine with arms tucked. Antibiotics were administered. A foley catheter was placed under sterile conditions. Hair in the region of planned surgery was clipped. The abdomen was prepped and draped in a sterile fashion. A timeout was performed confirming our patient and plan.   Beginning with the extraction port, a supraumbilical incision was made and carried down to the midline fascia. This was then incised with electrocautery. The peritoneum was identified and elevated between clamps and carefully opened sharply. A small Alexis wound protector with a cap and associated port was then placed. The abdomen was insufflated to 15 mmHg with CO2. A laparoscope was placed and camera inspection revealed no evidence of injury.  Bilateral TAP blocks were then performed under laparoscopic visualization using a mixture of 0.25% marcaine with epinepherine + Exparel. 3 additional ports were then placed under direct laparoscopic visualization - two in the left hemiabdomen and one in the right abdomen. The abdomen was surveyed. The liver and peritoneum appeared normal.  There were no signs of metastatic disease.  The patient was positioned in trendelenburg with left side down. The ileocolic pedicle was identified. Gentle blunt dissection commenced around the pedicle and the duodenum was identified and freed from the surrounding structures.  The retromesenteric plane was then developed all the way up to the level of the hepatic flexure.  The mesentery was swept up and the retroperitoneal tissue swept down.  Attention was then directed at the ileocolic pedicle.  The duodenum was well away from this.  This was then ligated and divided using the Enseal device.  The pedicle was inspected and noted to be hemostatic.  The terminal ileum and appendix were then freed from their attachments to the retroperitoneum carefully.  The colon was then mobilized medially by incising the Dalayza Zambrana line of Toldt beginning at the level of the cecum and carrying this all the way up to the hepatic flexure.  At this point, the cecum easily reached into the left upper quadrant.  He was then repositioned in reverse Trendelenburg.  The transverse colon was retracted inferiorly and the omentum retracted anteriorly.  The lesser sac was readily identified and developed working towards the hepatic flexure.  There were no mesenteric attachments to the liver that were carefully taken down with cautery.  This did not appear to be any sort of tumor involvement.  He does have mesenteric adenopathy.  This was all included with the planned specimen.  The  hepatic flexure was fully mobilized and reflected medially.  The duodenum again was reinspected and confirmed to be free of any sort  of injury and away from our planes of dissection.  At this point, the abdomen was desufflated and the terminal ileum and right colon delivered through the wound protector.  Of note, this mass is quite bulky and required extension of our extraction site cephalad.  The terminal ileum was then transected using a GIA blue load stapler.  A Babcock was placed on the distal ileum to assist in maintaining orientation.  The remaining mesentery was divided using the Enseal device, including the reactive vs involved adenopathy within the mesentery proper. The divided mesentery was inspected and noted to be hemostatic. The distal point of transection was then identified on the transverse colon at a location that included the right branch of the middle colics, leaving the main middle colic feeding the remaining transverse colon. This was transected using another blue load GIA stapler.  The specimen was then passed off.  Of note, a small omental type nodule was found which may actually represent an appendices epiploica.  This was passed off separately.    The omentum was inspected and there was a segment of questionable viability.  A partial omentectomy was then carried out to remove this segment.  Attention was turned to creating the anastomosis. The terminal ileum and transverse colon were inspected for orientation to ensure no twisting nor bowel included in the mesenteric defect. An anastomosis was created between the terminal ileum and the transverse colon using a 75 mm GIA blue load stapler. The staple line was inspected and noted to be hemostatic.  The common enterotomy channel was closed using a TA 90 blue load stapler. Hemostasis was achieved at the staple line using 3-0 silk U-stitches. 3-0 silk sutures were used to imbricate the corners of the TA staple line as well.  A 2-0 silk suture was placed securing the "apex" of the anastomosis. The anastomosis was palpated and noted to be widely patent. This was then  placed back into the abdomen. The abdomen was then irrigated with sterile saline and hemostasis verified. The remaining omentum was then brought down over the anastomosis. The wound protector cap was replaced and CO2 reinsufflated. The laparoscopic ports were removed under direct visualization and the sites noted to be hemostatic. The Alexis wound protector was removed, counts were reported correct, and we switched to clean instruments, gowns and drapes.   The extraction site fascia was then closed using two running #1 PDS sutures.  The fascia was palpated and noted to be completely closed.  The skin of all incision sites was closed with 4-0 monocryl subcuticular suture. Dermabond was placed on the port sites and a sterile dressing was placed over the abdominal incision. All counts were reported correct x2. He was then awakened from anesthesia, extubated, and transferred to a stretcher for transport to PACU in satisfactory condition.    DISPOSITION: PACU in satisfactory condition

## 2020-11-11 NOTE — Anesthesia Procedure Notes (Signed)
Procedure Name: Intubation Date/Time: 11/11/2020 7:40 AM Performed by: Claudia Desanctis, CRNA Pre-anesthesia Checklist: Patient identified, Emergency Drugs available, Suction available and Patient being monitored Patient Re-evaluated:Patient Re-evaluated prior to induction Oxygen Delivery Method: Circle system utilized Preoxygenation: Pre-oxygenation with 100% oxygen Induction Type: IV induction Ventilation: Mask ventilation without difficulty Laryngoscope Size: 2 and Miller Grade View: Grade I Tube type: Oral Tube size: 7.5 mm Number of attempts: 1 Airway Equipment and Method: Stylet Placement Confirmation: ETT inserted through vocal cords under direct vision, positive ETCO2 and breath sounds checked- equal and bilateral Secured at: 22 cm Tube secured with: Tape Dental Injury: Teeth and Oropharynx as per pre-operative assessment

## 2020-11-11 NOTE — Anesthesia Postprocedure Evaluation (Signed)
Anesthesia Post Note  Patient: Dylan Fischer  Procedure(s) Performed: LAPAROSCOPIC RIGHT HEMI COLECTOMY (Abdomen)     Patient location during evaluation: PACU Anesthesia Type: General Level of consciousness: sedated Pain management: pain level controlled Vital Signs Assessment: post-procedure vital signs reviewed and stable Respiratory status: spontaneous breathing and respiratory function stable Cardiovascular status: stable Postop Assessment: no apparent nausea or vomiting Anesthetic complications: no   No notable events documented.  Last Vitals:  Vitals:   11/11/20 1030 11/11/20 1045  BP: 117/73 124/80  Pulse: (!) 57 61  Resp: 14 17  Temp:    SpO2: 98% 100%    Last Pain:  Vitals:   11/11/20 1045  TempSrc:   PainSc: 3                  Chandria Rookstool DANIEL

## 2020-11-12 ENCOUNTER — Encounter (HOSPITAL_COMMUNITY): Payer: Self-pay | Admitting: Surgery

## 2020-11-12 LAB — CBC
HCT: 33.6 % — ABNORMAL LOW (ref 39.0–52.0)
Hemoglobin: 10.9 g/dL — ABNORMAL LOW (ref 13.0–17.0)
MCH: 26.9 pg (ref 26.0–34.0)
MCHC: 32.4 g/dL (ref 30.0–36.0)
MCV: 83 fL (ref 80.0–100.0)
Platelets: 208 10*3/uL (ref 150–400)
RBC: 4.05 MIL/uL — ABNORMAL LOW (ref 4.22–5.81)
RDW: 13.9 % (ref 11.5–15.5)
WBC: 9.7 10*3/uL (ref 4.0–10.5)
nRBC: 0 % (ref 0.0–0.2)

## 2020-11-12 LAB — BASIC METABOLIC PANEL
Anion gap: 6 (ref 5–15)
BUN: 12 mg/dL (ref 8–23)
CO2: 26 mmol/L (ref 22–32)
Calcium: 8.6 mg/dL — ABNORMAL LOW (ref 8.9–10.3)
Chloride: 105 mmol/L (ref 98–111)
Creatinine, Ser: 0.78 mg/dL (ref 0.61–1.24)
GFR, Estimated: >60 mL/min (ref >60)
Glucose, Bld: 137 mg/dL — ABNORMAL HIGH (ref 70–99)
Potassium: 4.3 mmol/L (ref 3.5–5.1)
Sodium: 137 mmol/L (ref 135–145)

## 2020-11-12 MED ORDER — TRAMADOL HCL 50 MG PO TABS: 50.0000 mg | ORAL_TABLET | Freq: Four times a day (QID) | ORAL | 0 refills | Status: AC | PRN

## 2020-11-12 NOTE — Discharge Instructions (Signed)
POST OP INSTRUCTIONS AFTER COLON SURGERY  DIET: Be sure to include lots of fluids daily to stay hydrated - 64oz of water per day (8, 8 oz glasses).  Avoid fast food or heavy meals for the first couple of weeks as your are more likely to get nauseated. Avoid raw/uncooked fruits or vegetables for the first 4 weeks (its ok to have these if they are blended into smoothie form). If you have fruits/vegetables, make sure they are cooked until soft enough to mash on the roof of your mouth and chew your food well. Otherwise, diet as tolerated.  Take your usually prescribed home medications unless otherwise directed.  PAIN CONTROL: Pain is best controlled by a usual combination of three different methods TOGETHER: Ice/Heat Over the counter pain medication Prescription pain medication Most patients will experience some swelling and bruising around the surgical site.  Ice packs or heating pads (30-60 minutes up to 6 times a day) will help. Some people prefer to use ice alone, heat alone, alternating between ice & heat.  Experiment to what works for you.  Swelling and bruising can take several weeks to resolve.   It is helpful to take an over-the-counter pain medication regularly for the first few weeks: Ibuprofen (Motrin/Advil) - 200mg tabs - take 3 tabs (600mg) every 6 hours as needed for pain (unless you have been directed previously to avoid NSAIDs/ibuprofen) Acetaminophen (Tylenol) - you may take 650mg every 6 hours as needed. You can take this with motrin as they act differently on the body. If you are taking a narcotic pain medication that has acetaminophen in it, do not take over the counter tylenol at the same time. NOTE: You may take both of these medications together - most patients  find it most helpful when alternating between the two (i.e. Ibuprofen at 6am, tylenol at 9am, ibuprofen at 12pm ...) A  prescription for pain medication should be given to you upon discharge.  Take your pain medication as  prescribed if your pain is not adequatly controlled with the over-the-counter pain reliefs mentioned above.  Avoid getting constipated.  Between the surgery and the pain medications, it is common to experience some constipation.  Increasing fluid intake and taking a fiber supplement (such as Metamucil, Citrucel, FiberCon, MiraLax, etc) 1-2 times a day regularly will usually help prevent this problem from occurring.  A mild laxative (prune juice, Milk of Magnesia, MiraLax, etc) should be taken according to package directions if there are no bowel movements after 48 hours.    Dressing: Your incisions are covered in Dermabond which is like sterile superglue for the skin. This will come off on it's own in a couple weeks. It is waterproof and you may bathe normally starting the day after your surgery in a shower. Avoid baths/pools/lakes/oceans until your wounds have fully healed.  ACTIVITIES as tolerated:   Avoid heavy lifting (>10lbs or 1 gallon of milk) for the next 6 weeks. You may resume regular daily activities as tolerated--such as daily self-care, walking, climbing stairs--gradually increasing activities as tolerated.  If you can walk 30 minutes without difficulty, it is safe to try more intense activity such as jogging, treadmill, bicycling, low-impact aerobics.  DO NOT PUSH THROUGH PAIN.  Let pain be your guide: If it hurts to do something, don't do it. You may drive when you are no longer taking prescription pain medication, you can comfortably wear a seatbelt, and you can safely maneuver your car and apply brakes.  FOLLOW UP in our   office Please call [facility] at [phone] to set up an appointment to see your surgeon in the office for a follow-up appointment approximately 2 weeks after your surgery. Make sure that you call for this appointment the day you arrive home to insure a convenient appointment time.  9. If you have disability or family leave forms that need to be completed, you may have  them completed by your primary care physician's office; for return to work instructions, please ask our office staff and they will be happy to assist you in obtaining this documentation   When to call us [phone]: Poor pain control Reactions / problems with new medications (rash/itching, etc)  Fever over 101.5 F (38.5 C) Inability to urinate Nausea/vomiting Worsening swelling or bruising Continued bleeding from incision. Increased pain, redness, or drainage from the incision  The clinic staff is available to answer your questions during regular business hours (8:30am-5pm).  Please don't hesitate to call and ask to speak to one of our nurses for clinical concerns.   A surgeon from [facility] is always on call at the hospitals   If you have a medical emergency, go to the nearest emergency room or call 911.  [facility] [address], [address], [place], [place]  [address] MAIN: [phone] FAX: [phone] [email or web address]  

## 2020-11-12 NOTE — Plan of Care (Signed)

## 2020-11-12 NOTE — Progress Notes (Signed)
  Subjective No acute events. Feeling quite well. Ambulating, foley out, passing flatus, tolerating full liquids. No n/v. No BM yet.  Objective: Vital signs in last 24 hours: Temp:  [97.6 F (36.4 C)-99.2 F (37.3 C)] 98.1 F (36.7 C) (09/02 0508) Pulse Rate:  [54-69] 54 (09/02 0508) Resp:  [11-18] 18 (09/02 0508) BP: (101-141)/(55-80) 103/59 (09/02 0508) SpO2:  [96 %-100 %] 99 % (09/02 0508) Last BM Date: 11/10/20  Intake/Output from previous day: 09/01 0701 - 09/02 0700 In: 2637.2 [P.O.:940; I.V.:1597.2; IV Piggyback:100] Out: 2725 [Urine:2700; Blood:25] Intake/Output this shift: No intake/output data recorded.  Gen: NAD, comfortable CV: RRR Pulm: Normal work of breathing Abd: Soft, appropriate incisional tenderness, nondistended. Wounds c/d/I without erythema or drainage Ext: SCDs in place  Lab Results: CBC  Recent Labs    11/12/20 0421  WBC 9.7  HGB 10.9*  HCT 33.6*  PLT 208   BMET Recent Labs    11/12/20 0421  NA 137  K 4.3  CL 105  CO2 26  GLUCOSE 137*  BUN 12  CREATININE 0.78  CALCIUM 8.6*   PT/INR No results for input(s): LABPROT, INR in the last 72 hours. ABG No results for input(s): PHART, HCO3 in the last 72 hours.  Invalid input(s): PCO2, PO2  Studies/Results:  Anti-infectives: Anti-infectives (From admission, onward)    Start     Dose/Rate Route Frequency Ordered Stop   11/11/20 1400  neomycin (MYCIFRADIN) tablet 1,000 mg  Status:  Discontinued       See Hyperspace for full Linked Orders Report.   1,000 mg Oral 3 times per day 11/11/20 0529 11/11/20 0532   11/11/20 1400  metroNIDAZOLE (FLAGYL) tablet 1,000 mg  Status:  Discontinued       See Hyperspace for full Linked Orders Report.   1,000 mg Oral 3 times per day 11/11/20 0529 11/11/20 0532   11/11/20 0600  cefoTEtan (CEFOTAN) 2 g in sodium chloride 0.9 % 100 mL IVPB        2 g 200 mL/hr over 30 Minutes Intravenous On call to O.R. 11/11/20 0529 11/11/20 0754         Assessment/Plan: Patient Active Problem List   Diagnosis Date Noted   Colon cancer ([place]) 11/11/2020   Adenocarcinoma of colon ([place]) 10/21/2020   Tubular adenoma of colon 10/21/2020   Rosacea blepharoconjunctivitis 05/26/2019   History of colonic polyps 02/25/2016   Screening for prostate cancer 02/25/2016   Encounter for health maintenance examination in adult 06/17/2015   Essential hypertension, benign 02/12/2013   Hyperlipidemia 02/12/2013   s/p Procedure(s): LAPAROSCOPIC RIGHT HEMI COLECTOMY 11/11/2020  -Doing great - advance to soft diet as tolerated -D/C IVF -Ambulate 5x/day -We spent time yesterday evening reviewing his procedure, findings, plans and goals moving forward -Ppx: SQH, SCDs -Dispo: Possible discharge tomorrow if having reliable bowel function, tolerating diet, etc   LOS: 1 day   Nadeen Landau, MD [facility] Use [email or web address] to contact on call provider

## 2020-11-13 LAB — BASIC METABOLIC PANEL
Anion gap: 4 — ABNORMAL LOW (ref 5–15)
BUN: 15 mg/dL (ref 8–23)
CO2: 27 mmol/L (ref 22–32)
Calcium: 8.2 mg/dL — ABNORMAL LOW (ref 8.9–10.3)
Chloride: 107 mmol/L (ref 98–111)
Creatinine, Ser: 0.9 mg/dL (ref 0.61–1.24)
GFR, Estimated: >60 mL/min (ref >60)
Glucose, Bld: 92 mg/dL (ref 70–99)
Potassium: 3.8 mmol/L (ref 3.5–5.1)
Sodium: 138 mmol/L (ref 135–145)

## 2020-11-13 NOTE — Discharge Summary (Signed)
Physician Discharge Summary   Patient ID: Dylan Fischer MRN: [id number] DOB/AGE: [birth year] 60 y.o.  Admit date: 11/11/2020  Discharge date: 11/13/2020  Discharge Diagnoses:  Active Problems:   Colon cancer [facility])   Discharged Condition: good  Hospital Course: Patient was admitted for observation following laparoscopic colon surgery.  Post op course was uncomplicated.  Pain was well controlled.  Tolerated diet.  Patient was prepared for discharge home on POD#2.   Consults: None  Treatments: surgery: laparoscopic right colectomy - Dr. Angelena Form  Discharge Exam: Blood pressure 110/73, pulse (!) 59, temperature 97.6 F (36.4 C), temperature source Oral, resp. rate 18, height 6\' 4"  (1.93 m), weight 98.4 kg, SpO2 98 %. HEENT - clear Neck - soft Chest - clear bilaterally Cor - RRR Abd - wounds dry and intact with Dermabond in place; soft; non-tender  Disposition: Home  Discharge Instructions     Diet - low sodium heart healthy   Complete by: As directed    Increase activity slowly   Complete by: As directed    No dressing needed   Complete by: As directed       Allergies as of 11/13/2020   No Known Allergies      Medication List     TAKE these medications    doxycycline 100 MG tablet Commonly known as: VIBRA-TABS Take 1 tablet (100 mg total) by mouth 2 (two) times daily. What changed:  when to take this reasons to take this   Melatonin 2.5 MG Chew Chew 2.5 mg by mouth at bedtime as needed (sleep).   olmesartan-hydrochlorothiazide 20-12.5 MG tablet Commonly known as: Benicar HCT Take 1 tablet by mouth daily.   omega-3 acid ethyl esters 1 g capsule Commonly known as: LOVAZA TAKE 2 CAPSULES BY MOUTH TWICE A DAY   rosuvastatin 40 MG tablet Commonly known as: CRESTOR Take 1 tablet (40 mg total) by mouth daily.   traMADol 50 MG tablet Commonly known as: Ultram Take 1 tablet (50 mg total) by mouth every 6 (six) hours as needed for up to 5 days (postop  pain not controlled with tylenol and ibuprofen first).               Discharge Care Instructions  (From admission, onward)           Start     Ordered   11/13/20 0000  No dressing needed        11/13/20 1044            Follow-up Information     Andria Meuse, MD. Schedule an appointment as soon as possible for a visit in 2 week(s).   Specialties: General Surgery, Colon and Rectal Surgery Contact information: [address] [place] [address] [phone]                 Darnell Level, MD [facility] Office: [phone]   Signed: Darnell Level 11/13/2020, 10:44 AM

## 2020-11-18 ENCOUNTER — Telehealth: Payer: Self-pay | Admitting: Genetic Counselor

## 2020-11-18 ENCOUNTER — Other Ambulatory Visit: Payer: Self-pay | Admitting: *Deleted

## 2020-11-18 DIAGNOSIS — C189 Malignant neoplasm of colon, unspecified: Secondary | ICD-10-CM

## 2020-11-18 NOTE — Progress Notes (Unsigned)
ref

## 2020-11-18 NOTE — Telephone Encounter (Signed)
Scheduled appt per 9/8 referral. Pt is aware of appt date and time.

## 2020-11-24 ENCOUNTER — Inpatient Hospital Stay: Payer: 59

## 2020-11-24 ENCOUNTER — Encounter: Payer: Self-pay | Admitting: Genetic Counselor

## 2020-11-24 ENCOUNTER — Inpatient Hospital Stay: Payer: 59 | Attending: Genetic Counselor | Admitting: Genetic Counselor

## 2020-11-24 ENCOUNTER — Other Ambulatory Visit: Payer: Self-pay

## 2020-11-24 ENCOUNTER — Other Ambulatory Visit: Payer: Self-pay | Admitting: Genetic Counselor

## 2020-11-24 ENCOUNTER — Encounter: Payer: Self-pay | Admitting: *Deleted

## 2020-11-24 DIAGNOSIS — I10 Essential (primary) hypertension: Secondary | ICD-10-CM | POA: Insufficient documentation

## 2020-11-24 DIAGNOSIS — C189 Malignant neoplasm of colon, unspecified: Secondary | ICD-10-CM

## 2020-11-24 DIAGNOSIS — Z8042 Family history of malignant neoplasm of prostate: Secondary | ICD-10-CM | POA: Insufficient documentation

## 2020-11-24 DIAGNOSIS — Z803 Family history of malignant neoplasm of breast: Secondary | ICD-10-CM

## 2020-11-24 DIAGNOSIS — C184 Malignant neoplasm of transverse colon: Secondary | ICD-10-CM | POA: Insufficient documentation

## 2020-11-24 DIAGNOSIS — Z860100 Personal history of colon polyps, unspecified: Secondary | ICD-10-CM

## 2020-11-24 DIAGNOSIS — L718 Other rosacea: Secondary | ICD-10-CM | POA: Insufficient documentation

## 2020-11-24 NOTE — Progress Notes (Signed)
REFERRING PROVIDER: Ileana Roup, MD [place],  [place] [address]  PRIMARY PROVIDER:  Denita Lung, MD  PRIMARY REASON FOR VISIT:  1. Adenocarcinoma of colon ([place])   2. Family history of breast cancer   3. Family history of prostate cancer   4. History of colonic polyps      HISTORY OF PRESENT ILLNESS:   Mr. Boudoin, a 60 y.o. male, was seen for a [place] cancer genetics consultation at the request of Dr. Dema Severin due to a personal and family history of cancer.  Mr. Riemenschneider presents to clinic today to discuss the possibility of a hereditary predisposition to cancer, genetic testing, and to further clarify his future cancer risks, as well as potential cancer risks for family members.   In July 2022, at the age of 88, Mr. Crochet was diagnosed with adenocarcinoma of the colon. The treatment plan has not been fully determined as he sees his oncologist on September 15.Marland Kitchen   CANCER HISTORY:  Oncology History   No history exists.    Past Medical History:  Diagnosis Date   Cancer [facility])    Colonic polyp    Family history of breast cancer    Family history of prostate cancer    Hyperlipidemia    Hypertension    Ocular rosacea    on Doxycycline long term    Past Surgical History:  Procedure Laterality Date   BILATERAL LASER VEIN SURGERY ON LEGS      COLONOSCOPY  2012   due 2017.   Dr. Henrene Pastor, pending f/u 06/2015   LAPAROSCOPIC RIGHT HEMI COLECTOMY N/A 11/11/2020   Procedure: LAPAROSCOPIC RIGHT HEMI COLECTOMY;  Surgeon: Ileana Roup, MD;  Location: [facility]S;  Service: General;  Laterality: N/A;   MOUTH SURGERY      Social History   Socioeconomic History   Marital status: Married    Spouse name: Not on file   Number of children: Not on file   Years of education: Not on file   Highest education level: Not on file  Occupational History   Not on file  Tobacco Use   Smoking status: Former    Types: Cigars   Smokeless tobacco: Never  Vaping Use    Vaping Use: Never used  Substance and Sexual Activity   Alcohol use: Yes    Alcohol/week: 4.0 standard drinks    Types: 4 Standard drinks or equivalent per week    Comment: occasionally   Drug use: Never   Sexual activity: Yes  Other Topics Concern   Not on file  Social History Narrative   Married, 2 children, 27yo and 24yo.  1 child at [facility] in [place], 1 daughter an Psychologist, prison and probation services in [place].  Works as Estate agent.  Wife is admin at [employer].  Exercise - walking, doing some elliptical, eating healthy.   02/2016   Social Determinants of Health   Financial Resource Strain: Not on file  Food Insecurity: Not on file  Transportation Needs: Not on file  Physical Activity: Not on file  Stress: Not on file  Social Connections: Not on file     FAMILY HISTORY:  We obtained a detailed, 4-generation family history.  Significant diagnoses are listed below: Family History  Problem Relation Age of Onset   Arthritis Mother    Hyperlipidemia Mother    Heart disease Mother        5   Breast cancer Mother 87   Hypertension Father  COPD Father    Hyperlipidemia Father    Stroke Father    Aneurysm Father 12   Prostate cancer Father 7   Colon cancer Neg Hx    Esophageal cancer Neg Hx    Rectal cancer Neg Hx    Stomach cancer Neg Hx     The patient has two daughters who are cancer free.  He has one full brother and four maternal half brothers.  He is somewhat estranged from his family and does not have any information on the health of his brothers.  Both parents are deceased.  The patient's mother had breast cancer in her 32's and died at 34. She had a brother and two sisters who the patient thinks were cancer free.  His maternal grandparents died, possibly of heart diease.  The patient's father had prostate cancer at 25 and died of a stroke.  He had three brothers who were cancer free.  The paternal grandparents died of non-cancer related  causes.  Mr. Dickison is unaware of previous family history of genetic testing for hereditary cancer risks. Patient's maternal ancestors are of [place] descent, and paternal ancestors are of English descent. There is no reported Ashkenazi Jewish ancestry. There is no known consanguinity.  GENETIC COUNSELING ASSESSMENT: Mr. Urieta is a 60 y.o. male with a personal and family history of cancer which is somewhat suggestive of a sporadic predisposition to cancer given the ages of onset and type of cancer. We, therefore, discussed and recommended the following at today's visit.   DISCUSSION: We discussed that 5 - 7% of colon cancer is hereditary, with most cases associated with Lynch syndrome. There are other genes that can be associated with hereditary colon cancer syndromes.  These include APC. MUTYH, and CHEK2.   We discussed with Mr. Clute that the personal and family history does not meet insurance or NCCN criteria for genetic testing and, therefore, is not highly consistent with a familial hereditary cancer syndrome.  However, he is not familiar with his family history, and therefore there could be more cancer in the family that he is not aware of.  Currently, we do not feel that insurance will pick up the cost of testing, but we could still proceed with testing with an out of pocket cost of approximately $250.   When reviewing Mr. Thul's pathology, we noticed that MSI/IHC testing was not performed.  We discussed that while he currently seems to be at low risk for a hereditary syndrome, once that testing is performed it could identify that he is actually at increased risk.  At that time he would be re-referred to genetics and we would anticipate that insurance would cover at expected rates.  At this time, we feel he is at low risk to harbor a gene mutation associated with such a condition. Thus, we did not recommend any genetic testing, at this time, and recommended Mr. Ruderman continue to follow the  cancer screening guidelines given by his primary healthcare provider.  PLAN: Mr. Comella did not wish to pursue genetic testing at today's visit. We understand this decision and remain available to coordinate genetic testing at any time in the future. He plans to see Dr. Benay Spice tomorrow, and can discuss this further with him at that time.   Lastly, we encouraged Mr. Mimbs to remain in contact with cancer genetics annually so that we can continuously update the family history and inform him of any changes in cancer genetics and testing that may be of benefit for  this family.   Mr. Wieczorek questions were answered to his satisfaction today. Our contact information was provided should additional questions or concerns arise. Thank you for the referral and allowing [place] to share in the care of your patient.   Renaud Celli P. Florene Glen, [place], [facility] Licensed, Insurance risk surveyor [email or web address] phone: [phone]  The patient was seen for a total of 40 minutes in face-to-face genetic counseling.  The patient brought his wife. This patient was discussed with Drs. Magrinat, Lindi Adie and/or Burr Medico who agrees with the above.    _______________________________________________________________________ For Office Staff:  Number of people involved in session: 2 Was an Intern/ student involved with case: yes Raymond Gurney

## 2020-11-24 NOTE — Progress Notes (Signed)
The proposed treatment discussed in conference is for discussion purpose only and is not a binding recommendation.  The patients have not been physically examined, or presented with their treatment options.  Therefore, final treatment plans cannot be decided.  

## 2020-11-25 ENCOUNTER — Encounter: Payer: Self-pay | Admitting: *Deleted

## 2020-11-25 ENCOUNTER — Inpatient Hospital Stay (HOSPITAL_BASED_OUTPATIENT_CLINIC_OR_DEPARTMENT_OTHER): Payer: 59 | Admitting: Oncology

## 2020-11-25 VITALS — BP 100/72 | HR 62 | Temp 98.2°F | Resp 20 | Ht 76.0 in | Wt 210.6 lb

## 2020-11-25 DIAGNOSIS — C189 Malignant neoplasm of colon, unspecified: Secondary | ICD-10-CM | POA: Diagnosis not present

## 2020-11-25 MED ORDER — CAPECITABINE 500 MG PO TABS
ORAL_TABLET | ORAL | 0 refills | Status: DC
  Filled 2020-11-25: qty 112, fill #0

## 2020-11-25 NOTE — Progress Notes (Signed)
Met with Dylan Fischer and his wife in shared visit with Dr Benay Spice f/u after surgery, he will begin Capecitabine on 9/26 14 days on 7 off cycle. Dr Benay Spice sending prescription to [facility]. Reviewed taking same times daily, given pillbox, reviewed side effects such as hand/foot syndrome, nausea, diarrhea, mouth sores, and immunosuppression and fatigue. Chemo alert card given and discussed as well. Will f/u with final pathology report, and will f/u with pt as he begins Capecitabine therapy.  Given contact information to call with any issues or questions

## 2020-11-25 NOTE — Progress Notes (Signed)
[facility] New Patient Consult   Requesting MD: Irene Shipper, Md [address],  [place] [id number]   Schylar Wuebker 60 y.o.  11/20/1960    Reason for Consult: Colon cancer   HPI: Mr. Caba reports a 7-31-month history mid abdominal pain prior to seeing Dr. Henrene Pastor for colonoscopy.  A colonoscopy on 10/05/2020 revealed a completely obstructing mass at the hepatic flexure.  The mass was biopsied and the area was tattooed.  Polyps were removed from the sigmoid colon and transverse colon.  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  The transverse polyp was a hyperplastic polyp in the sigmoid polyp and a tubular adenoma.  CTs on 10/15/2020 revealed a 1-2 mm right middle lobe nodule.  2 subcentimeter hepatic hypodensities measuring up to 6 mm.  There is short segment wall thickening at the hepatic flexure colon with adjacent inflammatory stranding.  Tiny mesenteric lymph nodes adjacent to the colon tumor.  Prominent hepatoduodenal, gastropathic, periportal lymph nodes measuring up to 7 mm.  No pathologically enlarged lymph nodes in the abdomen or pelvis.  He was referred to Dr. Dema Severin and was taken to the operating room for a left scopic right colectomy on 11/11/2020.  A tattoo was noted at the hepatic flexure with a large mass in the proximal transverse colon.  A small omental nodule was resected.  No evidence of metastatic disease on the peritoneum or liver.  The pathology revealed fat necrosis involving the peritoneal nodule.  There was an 8.4 cm involving the proximal transverse colon, moderate to poorly differentiated.  Tumor invades the visceral peritoneum.  No lymphovascular perineural invasion.  No macroscopic tumor perforation.  Resection margins are negative.  0/27 lymph nodes contained metastatic carcinoma.  No tumor deposits.  A separate 1.3 x 1.2 x 0.5 cm satellite mass was noted 1 cm  distal to the large tumor.  The second tumor invades the muscularis propria.  Both tumors have signet ring cell morphology.  He has recovered from surgery.  He is eating and having bowel movements.  Past Medical History:  Diagnosis Date   Cancer-transverse colon, pT4apN0 11/11/2020   Colonic polyps    Family history of breast cancer    Family history of prostate cancer    Hyperlipidemia    Hypertension    Ocular rosacea    on Doxycycline long term    Past Surgical History:  Procedure Laterality Date   BILATERAL LASER VEIN SURGERY ON LEGS      COLONOSCOPY  2012   due 2017.   Dr. Henrene Pastor, pending f/u 06/2015   LAPAROSCOPIC RIGHT HEMI COLECTOMY N/A 11/11/2020   Procedure: LAPAROSCOPIC RIGHT HEMI COLECTOMY;  Surgeon: Ileana Roup, MD;  Location: [facility];  Service: General;  Laterality: N/A;   MOUTH SURGERY      Medications: Reviewed  Allergies: No Known Allergies  Family history: His father had prostate cancer his mother had breast cancer.  Social History:   He lives with his wife in [place].  He is retired from a Estate agent.  He does not use cigarettes.  He reports mild alcohol use.  No transfusion history.  No risk factor for HIV or hepatitis.  He has received COVID-19 vaccines.  ROS:   Positives include: Mid to low abdominal pain prior to surgery, constipation prior to surgery, "hemorrhoid bleeding ", chronic low back pain, "cyst "in the left lower abdominal skin  A complete ROS  was otherwise negative.  Physical Exam:  Blood pressure 100/72, pulse 62, temperature 98.2 F (36.8 C), temperature source Oral, resp. rate 20, height $RemoveBe'6\' 4"'JuHyHlWIU$  (1.93 m), weight 210 lb 9.6 oz (95.5 kg), SpO2 95 %.  HEENT: Neck without mass Lungs: Clear bilaterally Cardiac: Regular rate and rhythm Abdomen: No mass, nontender, no hepatosplenomegaly, healed surgical incisions GU: Testes without mass Vascular: No leg edema Lymph nodes: No cervical, supraclavicular, axillary,  or inguinal nodes Neurologic: Alert and oriented, the motor exam appears intact in the upper and lower extremities bilaterally Skin: No rash, less than 1 cm cutaneous nodule lesion of the left lower abdomen Musculoskeletal: No spine tenderness   LAB:  CBC  Lab Results  Component Value Date   WBC 9.7 11/12/2020   HGB 10.9 (L) 11/12/2020   HCT 33.6 (L) 11/12/2020   MCV 83.0 11/12/2020   PLT 208 11/12/2020   NEUTROABS 6.1 08/12/2020        CMP  Lab Results  Component Value Date   NA 138 11/13/2020   K 3.8 11/13/2020   CL 107 11/13/2020   CO2 27 11/13/2020   GLUCOSE 92 11/13/2020   BUN 15 11/13/2020   CREATININE 0.90 11/13/2020   CALCIUM 8.2 (L) 11/13/2020   PROT 7.0 10/05/2020   ALBUMIN 4.2 10/05/2020   AST 19 10/05/2020   ALT 19 10/05/2020   ALKPHOS 77 10/05/2020   BILITOT 0.5 10/05/2020   GFRNONAA >60 11/13/2020   GFRAA 101 06/26/2019   CEA on 10/05/2020-0.5  Imaging: As per HPI-CT images from 10/15/2020 reviewed  Assessment/Plan:   Colon cancer, transverse, stage IIb (pT4apN0), separate 1.3 cm-T2 lesion adjacent to the larger tumor No lymphovascular or perineural invasion, G2-G3, 0/27 lymph nodes, both tumor with signet ring cell morphology Colonoscopy 10/05/2020-near completely obstructing mass at the hepatic flexure-could not be passed, polyps in the sigmoid and transverse colon CTs 10/15/2020-short segment wall thickening with adjacent inflammatory stranding at the hepatic flexure, prominent mesenteric nodes adjacent to the colonic neoplasm measuring up to 5 mm, prominent gastropathic, hepatoduodenal ligament, and right sided retroperitoneal nodes measured up to 7 mm, subcentimeter hypodense hypodensities-too small to characterize, tiny right middle lobe nodule  Tubular adenoma on the colonoscopy 10/05/2020 and on colonoscopy 06/14/2010 Hypertension Ocular rosacea   Disposition:   Mr. Blahnik has been diagnosed with adenocarcinoma of the transverse colon.  He he  had separate primary tumors at the proximal transverse colon versus a larger tumor with a satellite lesion.  I reviewed details of the surgery pathology report with Mr. Reesman and his wife.  We reviewed the preoperative CT images.  He has a good prognosis for long-term disease-free survival.  We discussed the benefit associated with adjuvant 5-fluorouracil and oxaliplatin based chemotherapy in patients with resected stage III colon cancer.  I explained the improved prognosis in patients with stage II colon cancer as opposed to stage III.  We discussed the indication for adjuvant systemic chemotherapy in stage II patients.  His tumor has several high risk features (T4 lesion and poorly differentiated).  I recommend adjuvant chemotherapy in his case.  We discussed capecitabine and capecitabine/oxaliplatin.  We also discussed recent data supporting the use of circulating tumor DNA testing to direct adjuvant therapy and predict prognosis.  The small abdominal lymph nodes are likely benign.  We will consider a staging PET scan if the circulating tumor DNA test returns positive.  The plan is to begin single agent adjuvant capecitabine on 12/06/2020.  We reviewed potential toxicities associated with capecitabine  including the chance of nausea, mucositis, diarrhea, and hematologic toxicity.  We discussed the rash, sun sensitivity, hyperpigmentation, and hand/foot syndrome associated capecitabine.  He agrees to proceed.  We will follow-up on MSI/mismatch repair protein testing as this has not been completed.  He understands his family members are at increased risk of developing colorectal cancer and should receive appropriate screening.  He will return for an office visit prior to cycle 2 capecitabine.  Betsy Coder, MD  11/25/2020, 4:45 PM

## 2020-11-26 ENCOUNTER — Telehealth: Payer: Self-pay | Admitting: Pharmacy Technician

## 2020-11-26 ENCOUNTER — Telehealth: Payer: Self-pay | Admitting: Pharmacist

## 2020-11-26 ENCOUNTER — Other Ambulatory Visit (HOSPITAL_COMMUNITY): Payer: Self-pay

## 2020-11-26 ENCOUNTER — Encounter: Payer: Self-pay | Admitting: *Deleted

## 2020-11-26 DIAGNOSIS — C189 Malignant neoplasm of colon, unspecified: Secondary | ICD-10-CM

## 2020-11-26 LAB — SURGICAL PATHOLOGY

## 2020-11-26 MED ORDER — CAPECITABINE 500 MG PO TABS
2000.0000 mg | ORAL_TABLET | Freq: Two times a day (BID) | ORAL | 0 refills | Status: DC
  Filled 2020-11-26: qty 112, 14d supply, fill #0

## 2020-11-26 NOTE — Telephone Encounter (Signed)
Oral Oncology Patient Advocate Encounter  Prior Authorization for Xeloda has been approved.    PA# [id number] Effective dates: 11/26/20 through 11/26/21  Patient must use [facility].  Oral Oncology Clinic will continue to follow.   [place] Patient [place] Phone [phone] Fax [phone] 11/26/2020 2:10 PM

## 2020-11-26 NOTE — Telephone Encounter (Signed)
Oral Oncology Pharmacist Encounter  Received new prescription for Xeloda (capecitabine) for the treatment of stage IIb colon cancer, planned duration ~ 6 months. Planned start 12/06/20.  BMP from 11/13/20 assessed, no relevant lab abnormalities. Prescription dose and frequency assessed.   Current medication list in Epic reviewed, no DDIs with capecitabine identified.  Evaluated chart and no patient barriers to medication adherence identified.   Prescription has been e-scribed to the [facility] for benefits analysis and approval.  Oral Oncology Clinic will continue to follow for insurance authorization, copayment issues, initial counseling and start date.  Patient agreed to treatment on 11/25/20 per MD documentation.  Darl Pikes, PharmD, BCPS, BCOP, CPP Hematology/Oncology Clinical Pharmacist Practitioner [facility] [phone]  11/26/2020 1:07 PM

## 2020-11-26 NOTE — Progress Notes (Signed)
IHC/MSI testing on Colon pathology requested 11/25/20. Will call patient today to have Guardant 360 labs drawn on 9/26.

## 2020-11-26 NOTE — Telephone Encounter (Signed)
Oral Oncology Patient Advocate Encounter   Received notification from [facility] that prior authorization for Xeloda is required.   PA submitted on CoverMyMeds Key [id number] Status is pending   Oral Oncology Clinic will continue to follow.  [place] Patient [place] Phone [phone] Fax [phone] 11/26/2020 9:44 AM

## 2020-11-29 ENCOUNTER — Other Ambulatory Visit: Payer: Self-pay | Admitting: Genetic Counselor

## 2020-11-29 ENCOUNTER — Telehealth: Payer: Self-pay | Admitting: Oncology

## 2020-11-29 ENCOUNTER — Telehealth: Payer: Self-pay | Admitting: Genetic Counselor

## 2020-11-29 DIAGNOSIS — C184 Malignant neoplasm of transverse colon: Secondary | ICD-10-CM

## 2020-11-29 NOTE — Telephone Encounter (Signed)
Called pt per 9/16 sch msg - no answer. Left message for patient with appt date and time

## 2020-11-29 NOTE — Telephone Encounter (Signed)
Received message from Dr. Dema Severin that patient has two synchronous colon cancers.  This qualifies the patient for testing.  I called patient to notify him of his eligibility for testing and that we can add a test kit to his blood draw on 12/06/2020.  He is agreeable to that.

## 2020-12-01 ENCOUNTER — Other Ambulatory Visit (HOSPITAL_COMMUNITY): Payer: Self-pay

## 2020-12-01 ENCOUNTER — Encounter: Payer: Self-pay | Admitting: *Deleted

## 2020-12-01 MED ORDER — CAPECITABINE 500 MG PO TABS: 2000.0000 mg | ORAL_TABLET | Freq: Two times a day (BID) | ORAL | 0 refills | Status: DC

## 2020-12-01 NOTE — Telephone Encounter (Signed)
Oral Chemotherapy Pharmacist Encounter  Patient was informed that his Xeloda prescription was required by his insurance to be filled at [facility]. He has set up his delivery for 12/06/20. He knows not to start his medication until 12/13/20 along with his infusion.  Patient Education I spoke with patient for overview of new oral chemotherapy medication: Xeloda (capecitabine) in combination with oxaliplatin. for the treatment of stage IIb colon cancer, planned duration of 3 months. Planned start 12/13/20. *Start date changed and oxaliplatin was added since the original assessment note   Pt is doing well. Counseled patient on administration, dosing, side effects, monitoring, drug-food interactions, safe handling, storage, and disposal. Patient will take 4 tablets (2,000 mg total) by mouth 2 (two) times daily after a meal. Take for 14 days, then hold for 7 days. Repeat every 21 days.  Side effects include but not limited to: diarrhea, hand-foot syndrome, mouth sores, edema, decreased wbc, fatigue, N/V.   Diarrhea: he has loperamide on hand at home to use as needed Hand-foot syndrome: He reports being provided a sample bottle of [place] Smooth Extra Care 20 from the office and already ordering additional bottles Mouth sores: he knows to call to obtain magiv mouth wash of mouth sores occur  Reviewed with patient importance of keeping a medication schedule and plan for any missed doses.  After discussion with patient no patient barriers to medication adherence identified.   Mr. Raden voiced understanding and appreciation. All questions answered. Medication handout and calendar provided.  Provided patient with [facility] phone number. Patient knows to call the office with questions or concerns. Oral Chemotherapy Navigation Clinic will continue to follow.  Darl Pikes, PharmD, BCPS, BCOP, CPP Hematology/Oncology Clinical Pharmacist Practitioner [facility]  [facility] [phone]  12/01/2020 2:58 PM

## 2020-12-01 NOTE — Telephone Encounter (Signed)
Oral Chemotherapy Pharmacist Encounter  Due to insurance restriction the medication could not be filled at [facility]. Prescription has been e[facility]scribed to [facility].  Supportive information was faxed to [facility]. We will continue to follow medication access.    Darl Pikes, PharmD, BCPS, [facility] Hematology/Oncology Clinical Pharmacist [facility] [phone]  12/01/2020 10:33 AM

## 2020-12-02 ENCOUNTER — Inpatient Hospital Stay (HOSPITAL_BASED_OUTPATIENT_CLINIC_OR_DEPARTMENT_OTHER): Payer: 59 | Admitting: Oncology

## 2020-12-02 ENCOUNTER — Other Ambulatory Visit: Payer: Self-pay

## 2020-12-02 ENCOUNTER — Encounter (HOSPITAL_COMMUNITY): Payer: Self-pay | Admitting: Oncology

## 2020-12-02 VITALS — BP 111/77 | HR 63 | Temp 98.1°F | Resp 20 | Ht 76.0 in | Wt 211.6 lb

## 2020-12-02 DIAGNOSIS — C189 Malignant neoplasm of colon, unspecified: Secondary | ICD-10-CM | POA: Diagnosis not present

## 2020-12-02 DIAGNOSIS — Z803 Family history of malignant neoplasm of breast: Secondary | ICD-10-CM | POA: Diagnosis not present

## 2020-12-02 DIAGNOSIS — L718 Other rosacea: Secondary | ICD-10-CM | POA: Diagnosis not present

## 2020-12-02 DIAGNOSIS — C184 Malignant neoplasm of transverse colon: Secondary | ICD-10-CM | POA: Diagnosis present

## 2020-12-02 DIAGNOSIS — I10 Essential (primary) hypertension: Secondary | ICD-10-CM | POA: Diagnosis not present

## 2020-12-02 DIAGNOSIS — Z8042 Family history of malignant neoplasm of prostate: Secondary | ICD-10-CM | POA: Diagnosis not present

## 2020-12-02 NOTE — Progress Notes (Signed)
[place] OFFICE PROGRESS NOTE   Diagnosis: Colon cancer  INTERVAL HISTORY:   D[place] returns for further discussion regarding adjuvant treatment options.  He is here with his wife.  No new complaint. Additional pathology results have returned since I saw him last week.  The tumor returned MSI high with loss of MLH1 and PMS2 expression. Objective:  Vital signs in last 24 hours:  Blood pressure 111/77, pulse 63, temperature 98.1 F (36.7 C), temperature source Oral, resp. rate 20, height '6\' 4"'  (1.93 m), weight 211 lb 9.6 oz (96 kg), SpO2 99 %. Physical examination-not performed today  Lab Results:  Lab Results  Component Value Date   WBC 9.7 11/12/2020   HGB 10.9 (L) 11/12/2020   HCT 33.6 (L) 11/12/2020   MCV 83.0 11/12/2020   PLT 208 11/12/2020   NEUTROABS 6.1 08/12/2020    CMP  Lab Results  Component Value Date   NA 138 11/13/2020   K 3.8 11/13/2020   CL 107 11/13/2020   CO2 27 11/13/2020   GLUCOSE 92 11/13/2020   BUN 15 11/13/2020   CREATININE 0.90 11/13/2020   CALCIUM 8.2 (L) 11/13/2020   PROT 7.0 10/05/2020   ALBUMIN 4.2 10/05/2020   AST 19 10/05/2020   ALT 19 10/05/2020   ALKPHOS 77 10/05/2020   BILITOT 0.5 10/05/2020   GFRNONAA >60 11/13/2020   GFRAA 101 06/26/2019    Lab Results  Component Value Date   CEA 0.5 10/05/2020    Medications: I have reviewed the patient's current medications.   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, MSI-high, loss of MLH1 and PMS2 expression Colonoscopy 10/05/2020-near completely obstructing mass at the hepatic flexure-could not be passed, polyps in the sigmoid and transverse colon CTs 10/15/2020-short segment wall thickening with adjacent inflammatory stranding at the hepatic flexure, prominent mesenteric nodes  adjacent to the colonic neoplasm measuring up to 5 mm, prominent gastropathic, hepatoduodenal ligament, and right sided retroperitoneal nodes measured up to 7 mm, subcentimeter hypodense hypodensities-too small to characterize, tiny right middle lobe nodule  Tubular adenoma on the colonoscopy 10/05/2020 and on colonoscopy 06/14/2010 Hypertension Ocular rosacea    Disposition: Dylan Fischer appears stable.  We discussed the mismatch repair protein expression testing and the implications for adjuvant therapy.  He has been diagnosed with an MSI high tumor.  The tumor is very likely sporadic.  We are waiting on MLH1 methylation testing and BRAF mutation testing. We discussed adjuvant therapy with single agent capecitabine when I saw him on 11/25/2020.  I explained MSI high tumors are felt to not respond as well to single agent capecitabine.  We discussed observation/surveillance versus proceeding with adjuvant therapy.  He is young and otherwise healthy.  The tumor had several high risk features.  I recommend he consider treatment with adjuvant 5-FU and oxaliplatin therapy.  We discussed CAPOX.  He is a candidate for 3 months of CAPOX.  We reviewed potential toxicities associated with oxaliplatin including the chance of nausea/vomiting, alopecia, and hematologic toxicity.  We discussed the allergic reaction and various types of neuropathy associated with oxaliplatin.  We also discussed discomfort associated with administering oxaliplatin via peripheral IV access.  He agrees to proceed with CAPOX.  The plan is to begin with peripheral IV access.  Dylan Fischer will be scheduled for cycle 1 CAPOX on 12/13/2020.  We will  obtain guardant reveal testing when he is here on 12/13/2020.  A chemotherapy plan was entered today.   Betsy Coder, MD  12/02/2020  2:46 PM

## 2020-12-02 NOTE — Telephone Encounter (Signed)
Called [facility] to check status of Xeloda rx.  Delivery has been scheduled for 12/06/20.  Therapy has been pushed out to 10/3 to start with infusion.  [place] Patient [place] Phone [phone] Fax [phone] 12/02/2020 3:57 PM

## 2020-12-02 NOTE — Progress Notes (Signed)
START ON PATHWAY REGIMEN - Colorectal ° ° °  A cycle is every 21 days: °    Capecitabine  °    Oxaliplatin  ° °**Always confirm dose/schedule in your pharmacy ordering system** ° °Patient Characteristics: °Postoperative without Neoadjuvant Therapy (Pathologic Staging), Colon, Stage IIB/C °Tumor Location: Colon °Therapeutic Status: Postoperative without Neoadjuvant Therapy (Pathologic Staging) °AJCC M Category: cM0 °AJCC T Category: pT4a °AJCC N Category: pN0 °AJCC 8 Stage Grouping: IIB °Intent of Therapy: °Curative Intent, Discussed with Patient °

## 2020-12-03 ENCOUNTER — Other Ambulatory Visit: Payer: Self-pay | Admitting: Oncology

## 2020-12-03 DIAGNOSIS — C189 Malignant neoplasm of colon, unspecified: Secondary | ICD-10-CM

## 2020-12-06 ENCOUNTER — Other Ambulatory Visit: Payer: Self-pay | Admitting: Family Medicine

## 2020-12-06 ENCOUNTER — Inpatient Hospital Stay: Payer: 59

## 2020-12-06 DIAGNOSIS — E785 Hyperlipidemia, unspecified: Secondary | ICD-10-CM

## 2020-12-07 ENCOUNTER — Other Ambulatory Visit: Payer: Self-pay | Admitting: Family Medicine

## 2020-12-07 DIAGNOSIS — I10 Essential (primary) hypertension: Secondary | ICD-10-CM

## 2020-12-07 MED ORDER — OLMESARTAN MEDOXOMIL-HCTZ 20-12.5 MG PO TABS: 1.0000 | ORAL_TABLET | Freq: Every day | ORAL | 0 refills | Status: DC

## 2020-12-08 ENCOUNTER — Inpatient Hospital Stay: Payer: 59 | Admitting: Nutrition

## 2020-12-08 ENCOUNTER — Encounter: Payer: 59 | Admitting: Nutrition

## 2020-12-10 ENCOUNTER — Other Ambulatory Visit: Payer: Self-pay

## 2020-12-10 ENCOUNTER — Inpatient Hospital Stay: Payer: 59

## 2020-12-10 MED ORDER — ONDANSETRON HCL 8 MG PO TABS: 8.0000 mg | ORAL_TABLET | Freq: Three times a day (TID) | ORAL | 1 refills | Status: DC | PRN

## 2020-12-10 MED ORDER — MAGIC MOUTHWASH: 5.0000 mL | Freq: Four times a day (QID) | ORAL | 0 refills | Status: DC | PRN

## 2020-12-10 MED ORDER — PROCHLORPERAZINE MALEATE 10 MG PO TABS: 10.0000 mg | ORAL_TABLET | Freq: Four times a day (QID) | ORAL | 1 refills | Status: DC | PRN

## 2020-12-11 ENCOUNTER — Other Ambulatory Visit: Payer: Self-pay | Admitting: Oncology

## 2020-12-13 ENCOUNTER — Encounter: Payer: Self-pay | Admitting: Nurse Practitioner

## 2020-12-13 ENCOUNTER — Inpatient Hospital Stay: Payer: 59

## 2020-12-13 ENCOUNTER — Inpatient Hospital Stay (HOSPITAL_BASED_OUTPATIENT_CLINIC_OR_DEPARTMENT_OTHER): Payer: 59 | Admitting: Nurse Practitioner

## 2020-12-13 ENCOUNTER — Other Ambulatory Visit: Payer: Self-pay

## 2020-12-13 ENCOUNTER — Inpatient Hospital Stay: Payer: 59 | Attending: Genetic Counselor

## 2020-12-13 VITALS — BP 115/76 | HR 60 | Temp 97.8°F | Resp 18 | Ht 76.0 in | Wt 214.0 lb

## 2020-12-13 DIAGNOSIS — Z79899 Other long term (current) drug therapy: Secondary | ICD-10-CM | POA: Insufficient documentation

## 2020-12-13 DIAGNOSIS — Z5111 Encounter for antineoplastic chemotherapy: Secondary | ICD-10-CM | POA: Insufficient documentation

## 2020-12-13 DIAGNOSIS — C184 Malignant neoplasm of transverse colon: Secondary | ICD-10-CM | POA: Insufficient documentation

## 2020-12-13 DIAGNOSIS — I10 Essential (primary) hypertension: Secondary | ICD-10-CM | POA: Diagnosis not present

## 2020-12-13 DIAGNOSIS — C189 Malignant neoplasm of colon, unspecified: Secondary | ICD-10-CM

## 2020-12-13 DIAGNOSIS — R110 Nausea: Secondary | ICD-10-CM | POA: Diagnosis not present

## 2020-12-13 DIAGNOSIS — R197 Diarrhea, unspecified: Secondary | ICD-10-CM | POA: Diagnosis not present

## 2020-12-13 DIAGNOSIS — R911 Solitary pulmonary nodule: Secondary | ICD-10-CM | POA: Insufficient documentation

## 2020-12-13 LAB — CBC WITH DIFFERENTIAL (CANCER CENTER ONLY)
Abs Immature Granulocytes: 0.02 10*3/uL (ref 0.00–0.07)
Basophils Absolute: 0 10*3/uL (ref 0.0–0.1)
Basophils Relative: 0 %
Eosinophils Absolute: 0.1 10*3/uL (ref 0.0–0.5)
Eosinophils Relative: 3 %
HCT: 41.5 % (ref 39.0–52.0)
Hemoglobin: 13.5 g/dL (ref 13.0–17.0)
Immature Granulocytes: 0 %
Lymphocytes Relative: 19 %
Lymphs Abs: 0.9 10*3/uL (ref 0.7–4.0)
MCH: 26.6 pg (ref 26.0–34.0)
MCHC: 32.5 g/dL (ref 30.0–36.0)
MCV: 81.7 fL (ref 80.0–100.0)
Monocytes Absolute: 0.4 10*3/uL (ref 0.1–1.0)
Monocytes Relative: 8 %
Neutro Abs: 3.4 10*3/uL (ref 1.7–7.7)
Neutrophils Relative %: 70 %
Platelet Count: 222 10*3/uL (ref 150–400)
RBC: 5.08 MIL/uL (ref 4.22–5.81)
RDW: 14.6 % (ref 11.5–15.5)
WBC Count: 4.9 10*3/uL (ref 4.0–10.5)
nRBC: 0 % (ref 0.0–0.2)

## 2020-12-13 LAB — CMP (CANCER CENTER ONLY)
ALT: 28 U/L (ref 0–44)
AST: 22 U/L (ref 15–41)
Albumin: 4.4 g/dL (ref 3.5–5.0)
Alkaline Phosphatase: 56 U/L (ref 38–126)
Anion gap: 9 (ref 5–15)
BUN: 13 mg/dL (ref 8–23)
CO2: 26 mmol/L (ref 22–32)
Calcium: 9.4 mg/dL (ref 8.9–10.3)
Chloride: 103 mmol/L (ref 98–111)
Creatinine: 0.78 mg/dL (ref 0.61–1.24)
GFR, Estimated: >60 mL/min (ref >60)
Glucose, Bld: 115 mg/dL — ABNORMAL HIGH (ref 70–99)
Potassium: 3.7 mmol/L (ref 3.5–5.1)
Sodium: 138 mmol/L (ref 135–145)
Total Bilirubin: 0.6 mg/dL (ref 0.3–1.2)
Total Protein: 6.8 g/dL (ref 6.5–8.1)

## 2020-12-13 LAB — GENETIC SCREENING ORDER

## 2020-12-13 MED ORDER — OXALIPLATIN CHEMO INJECTION 100 MG/20ML
130.0000 mg/m2 | Freq: Once | INTRAVENOUS | Status: AC
  Administered 2020-12-13: 295 mg via INTRAVENOUS
  Filled 2020-12-13: qty 59

## 2020-12-13 MED ORDER — SODIUM CHLORIDE 0.9 % IV SOLN
10.0000 mg | Freq: Once | INTRAVENOUS | Status: AC
  Administered 2020-12-13: 10 mg via INTRAVENOUS
  Filled 2020-12-13: qty 1

## 2020-12-13 MED ORDER — PALONOSETRON HCL INJECTION 0.25 MG/5ML
0.2500 mg | Freq: Once | INTRAVENOUS | Status: AC
  Administered 2020-12-13: 0.25 mg via INTRAVENOUS
  Filled 2020-12-13: qty 5

## 2020-12-13 MED ORDER — DEXTROSE 5 % IV SOLN: Freq: Once | INTRAVENOUS | Status: AC

## 2020-12-13 NOTE — Progress Notes (Signed)
  [facility] OFFICE PROGRESS NOTE   Diagnosis: Colon cancer  INTERVAL HISTORY:   Mr. Eltringham returns as scheduled.  He is seen today prior to proceeding with cycle 1 CAPOX.  He feels well.  No nausea or vomiting.  Bowels are moving.  Good appetite.   Objective:  Vital signs in last 24 hours:  Blood pressure 115/76, pulse 60, temperature 97.8 F (36.6 C), temperature source Oral, resp. rate 18, height $RemoveBe'6\' 4"'sDCUKPSAS$  (1.93 m), weight 214 lb (97.1 kg), SpO2 100 %.    HEENT: No thrush or ulcers. Resp: Lungs clear bilaterally. Cardio: Regular rate and rhythm. GI: Abdomen soft and nontender.  No hepatomegaly. Vascular: No leg edema.   Lab Results:  Lab Results  Component Value Date   WBC 4.9 12/13/2020   HGB 13.5 12/13/2020   HCT 41.5 12/13/2020   MCV 81.7 12/13/2020   PLT 222 12/13/2020   NEUTROABS 3.4 12/13/2020    Imaging:  No results found.  Medications: I have reviewed the patient's current medications.  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, MSI-high, loss of MLH1 and PMS2 expression, negative for BRAF V600E mutation, MLH1 hypermethylation absent  colonoscopy 10/05/2020-near completely obstructing mass at the hepatic flexure-could not be passed, polyps in the sigmoid and transverse colon CTs 10/15/2020-short segment wall thickening with adjacent inflammatory stranding at the hepatic flexure, prominent mesenteric nodes adjacent to the colonic neoplasm measuring up to 5 mm, prominent gastropathic, hepatoduodenal ligament, and right sided retroperitoneal nodes measured up to 7 mm, subcentimeter hypodense hypodensities-too small to characterize, tiny right middle lobe nodule   Tubular adenoma on the colonoscopy 10/05/2020 and on colonoscopy 06/14/2010 Hypertension Ocular rosacea     Disposition: Mr. Cooksey appears stable.  He is scheduled to begin treatment today on the CAPOX regimen.  We again reviewed potential toxicities.  Plan to proceed with cycle 1 today as scheduled.  We reviewed the CBC from today.  Counts adequate to proceed as above.  We discussed the absence of BRAF V600E mutation and absence of MLH1 promoter methylation.  Genetic testing was obtained today.  He will return for lab, follow-up, cycle 2 CAPOX in 3 weeks.  We are available to see him sooner if needed.  Patient seen with Dr. Benay Spice.    Ned Card ANP/GNP-BC   12/13/2020  10:48 AM  This was a shared visit with Ned Card.  We discussed the results of MSI/IHC, BRAF, and MLH1 methylation testing.  He could have hereditary non polyposis colon cancer syndrome.  We will follow-up on genetic testing results.  He will complete cycle 1 adjuvant CAPOX beginning today  I was present for greater than 50% of today's visit.  I informed medical decision making.  Julieanne Manson, MD

## 2020-12-13 NOTE — Progress Notes (Signed)
Patient presents for treatment. RN assessment completed along with the following:  Labs/vitals reviewed - Yes, and within treatment parameters.   Weight within 10% of previous measurement - Yes Oncology Treatment Attestation completed for current therapy- Yes, on date 12/02/20 Informed consent completed and reflects current therapy/intent - Yes, on date 12/13/20             Provider progress note reviewed - Yes, today's provider note was reviewed. Treatment/Antibody/Supportive plan reviewed - Yes, and there are no adjustments needed for today's treatment. S&H and other orders reviewed - Yes, and there are no additional orders identified. Previous treatment date reviewed - Yes, and the appropriate amount of time has elapsed between treatments. Clinic Hand Off Received from - Ned Card, RN   Patient to proceed with treatment.

## 2020-12-13 NOTE — Progress Notes (Signed)
Pharmacist Chemotherapy Monitoring - Initial Assessment    Anticipated start date: 12/13/20   The following has been reviewed per standard work regarding the patient's treatment regimen: The patient's diagnosis, treatment plan and drug doses, and organ/hematologic function Lab orders and baseline tests specific to treatment regimen  The treatment plan start date, drug sequencing, and pre-medications Prior authorization status  Patient's documented medication list, including drug-drug interaction screen and prescriptions for anti-emetics and supportive care specific to the treatment regimen The drug concentrations, fluid compatibility, administration routes, and timing of the medications to be used The patient's access for treatment and lifetime cumulative dose history, if applicable  The patient's medication allergies and previous infusion related reactions, if applicable   Changes made to treatment plan:  N/A  Follow up needed:  N/A   [place], [facility], 12/13/2020  12:22 PM

## 2020-12-13 NOTE — Patient Instructions (Signed)
[facility]  Discharge Instructions: Thank you for choosing [facility] to provide your oncology and hematology care.   If you have a lab appointment with the [facility], please go directly to the [facility] and check in at the registration area.   Wear comfortable clothing and clothing appropriate for easy access to any Portacath or PICC line.   We strive to give you quality time with your provider. You may need to reschedule your appointment if you arrive late (15 or more minutes).  Arriving late affects you and other patients whose appointments are after yours.  Also, if you miss three or more appointments without notifying the office, you may be dismissed from the clinic at the provider's discretion.      For prescription refill requests, have your pharmacy contact our office and allow 72 hours for refills to be completed.    Today you received the following chemotherapy and/or immunotherapy agents oxaliplatin      To help prevent nausea and vomiting after your treatment, we encourage you to take your nausea medication as directed.  BELOW ARE SYMPTOMS THAT SHOULD BE REPORTED IMMEDIATELY: *FEVER GREATER THAN 100.4 F (38 C) OR HIGHER *CHILLS OR SWEATING *NAUSEA AND VOMITING THAT IS NOT CONTROLLED WITH YOUR NAUSEA MEDICATION *UNUSUAL SHORTNESS OF BREATH *UNUSUAL BRUISING OR BLEEDING *URINARY PROBLEMS (pain or burning when urinating, or frequent urination) *BOWEL PROBLEMS (unusual diarrhea, constipation, pain near the anus) TENDERNESS IN MOUTH AND THROAT WITH OR WITHOUT PRESENCE OF ULCERS (sore throat, sores in mouth, or a toothache) UNUSUAL RASH, SWELLING OR PAIN  UNUSUAL VAGINAL DISCHARGE OR ITCHING   Items with * indicate a potential emergency and should be followed up as soon as possible or go to the Emergency Department if any problems should occur.  Please show the CHEMOTHERAPY ALERT CARD or IMMUNOTHERAPY ALERT CARD at check-in to  the Emergency Department and triage nurse.  Should you have questions after your visit or need to cancel or reschedule your appointment, please contact [facility]  Dept: [phone]  and follow the prompts.  Office hours are 8:00 a.m. to 4:30 p.m. Monday - Friday. Please note that voicemails left after 4:00 p.m. may not be returned until the following business day.  We are closed weekends and major holidays. You have access to a nurse at all times for urgent questions. Please call the main number to the clinic Dept: [phone] and follow the prompts.   For any non-urgent questions, you may also contact your provider using MyChart. We now offer e-Visits for anyone 46 and older to request care online for non-urgent symptoms. For details visit [email or web address].   Also download the MyChart app! Go to the app store, search "MyChart", open the app, select [facility], and log in with your MyChart username and password.  Due to Covid, a mask is required upon entering the hospital/clinic. If you do not have a mask, one will be given to you upon arrival. For doctor visits, patients may have 1 support person aged 90 or older with them. For treatment visits, patients cannot have anyone with them due to current Covid guidelines and our immunocompromised population.   Oxaliplatin Injection What is this medication? OXALIPLATIN (ox AL i PLA tin) is a chemotherapy drug. It targets fast dividing cells, like cancer cells, and causes these cells to die. This medicine is used to treat cancers of the colon and rectum, and many other cancers. This medicine may  be used for other purposes; ask your health care provider or pharmacist if you have questions. COMMON BRAND NAME(S): Eloxatin What should I tell my care team before I take this medication? They need to know if you have any of these conditions: heart disease history of irregular heartbeat liver disease low blood counts, like  white cells, platelets, or red blood cells lung or breathing disease, like asthma take medicines that treat or prevent blood clots tingling of the fingers or toes, or other nerve disorder an unusual or allergic reaction to oxaliplatin, other chemotherapy, other medicines, foods, dyes, or preservatives pregnant or trying to get pregnant breast-feeding How should I use this medication? This drug is given as an infusion into a vein. It is administered in a hospital or clinic by a specially trained health care professional. Talk to your pediatrician regarding the use of this medicine in children. Special care may be needed. Overdosage: If you think you have taken too much of this medicine contact a poison control center or emergency room at once. NOTE: This medicine is only for you. Do not share this medicine with others. What if I miss a dose? It is important not to miss a dose. Call your doctor or health care professional if you are unable to keep an appointment. What may interact with this medication? Do not take this medicine with any of the following medications: cisapride dronedarone pimozide thioridazine This medicine may also interact with the following medications: aspirin and aspirin-like medicines certain medicines that treat or prevent blood clots like warfarin, apixaban, dabigatran, and rivaroxaban cisplatin cyclosporine diuretics medicines for infection like acyclovir, adefovir, amphotericin B, bacitracin, cidofovir, foscarnet, ganciclovir, gentamicin, pentamidine, vancomycin NSAIDs, medicines for pain and inflammation, like ibuprofen or naproxen other medicines that prolong the QT interval (an abnormal heart rhythm) pamidronate zoledronic acid This list may not describe all possible interactions. Give your health care provider a list of all the medicines, herbs, non-prescription drugs, or dietary supplements you use. Also tell them if you smoke, drink alcohol, or use illegal  drugs. Some items may interact with your medicine. What should I watch for while using this medication? Your condition will be monitored carefully while you are receiving this medicine. You may need blood work done while you are taking this medicine. This medicine may make you feel generally unwell. This is not uncommon as chemotherapy can affect healthy cells as well as cancer cells. Report any side effects. Continue your course of treatment even though you feel ill unless your healthcare professional tells you to stop. This medicine can make you more sensitive to cold. Do not drink cold drinks or use ice. Cover exposed skin before coming in contact with cold temperatures or cold objects. When out in cold weather wear warm clothing and cover your mouth and nose to warm the air that goes into your lungs. Tell your doctor if you get sensitive to the cold. Do not become pregnant while taking this medicine or for 9 months after stopping it. Women should inform their health care professional if they wish to become pregnant or think they might be pregnant. Men should not father a child while taking this medicine and for 6 months after stopping it. There is potential for serious side effects to an unborn child. Talk to your health care professional for more information. Do not breast-feed a child while taking this medicine or for 3 months after stopping it. This medicine has caused ovarian failure in some women. This medicine may make  it more difficult to get pregnant. Talk to your health care professional if you are concerned about your fertility. This medicine has caused decreased sperm counts in some men. This may make it more difficult to father a child. Talk to your health care professional if you are concerned about your fertility. This medicine may increase your risk of getting an infection. Call your health care professional for advice if you get a fever, chills, or sore throat, or other symptoms of a  cold or flu. Do not treat yourself. Try to avoid being around people who are sick. Avoid taking medicines that contain aspirin, acetaminophen, ibuprofen, naproxen, or ketoprofen unless instructed by your health care professional. These medicines may hide a fever. Be careful brushing or flossing your teeth or using a toothpick because you may get an infection or bleed more easily. If you have any dental work done, tell your dentist you are receiving this medicine. What side effects may I notice from receiving this medication? Side effects that you should report to your doctor or health care professional as soon as possible: allergic reactions like skin rash, itching or hives, swelling of the face, lips, or tongue breathing problems cough low blood counts - this medicine may decrease the number of white blood cells, red blood cells, and platelets. You may be at increased risk for infections and bleeding nausea, vomiting pain, redness, or irritation at site where injected pain, tingling, numbness in the hands or feet signs and symptoms of bleeding such as bloody or black, tarry stools; red or dark brown urine; spitting up blood or brown material that looks like coffee grounds; red spots on the skin; unusual bruising or bleeding from the eyes, gums, or nose signs and symptoms of a dangerous change in heartbeat or heart rhythm like chest pain; dizziness; fast, irregular heartbeat; palpitations; feeling faint or lightheaded; falls signs and symptoms of infection like fever; chills; cough; sore throat; pain or trouble passing urine signs and symptoms of liver injury like dark yellow or brown urine; general ill feeling or flu-like symptoms; light-colored stools; loss of appetite; nausea; right upper belly pain; unusually weak or tired; yellowing of the eyes or skin signs and symptoms of low red blood cells or anemia such as unusually weak or tired; feeling faint or lightheaded; falls signs and symptoms of  muscle injury like dark urine; trouble passing urine or change in the amount of urine; unusually weak or tired; muscle pain; back pain Side effects that usually do not require medical attention (report to your doctor or health care professional if they continue or are bothersome): changes in taste diarrhea gas hair loss loss of appetite mouth sores This list may not describe all possible side effects. Call your doctor for medical advice about side effects. You may report side effects to FDA at 1-800-FDA-1088. Where should I keep my medication? This drug is given in a hospital or clinic and will not be stored at home. NOTE: This sheet is a summary. It may not cover all possible information. If you have questions about this medicine, talk to your doctor, pharmacist, or health care provider.  2022 [employer] Standard (2018-07-17 12:20:35)

## 2020-12-14 ENCOUNTER — Telehealth: Payer: Self-pay

## 2020-12-14 ENCOUNTER — Encounter: Payer: Self-pay | Admitting: Oncology

## 2020-12-14 NOTE — Telephone Encounter (Signed)
spoke with pt about concerns that he mentioned in my chart. reviewed recommendations from Dr Benay Spice. Completed 24 call back from 1st time chemo. all questions answered

## 2020-12-21 ENCOUNTER — Inpatient Hospital Stay: Payer: 59

## 2020-12-21 ENCOUNTER — Inpatient Hospital Stay: Payer: 59 | Admitting: Oncology

## 2020-12-24 ENCOUNTER — Encounter: Payer: Self-pay | Admitting: Oncology

## 2020-12-24 ENCOUNTER — Telehealth: Payer: Self-pay

## 2020-12-24 NOTE — Telephone Encounter (Signed)
Returning call from Pt's My Chart message stating he has had loose stools for the past 5 days and has vomited yesterday morning. Informed Pt to rotate his zofran and compazine and he could take lomotil up to 6 a day. And increase his fluids. Asked Pt if he could come to the office for fluids. Pt stated he is drinking Gatorade and eating crackers and will see how he feels this afternoon. Follow up call at 2 pm Pt stated he was able to keep down the 16 oz of Gatorade and ate some saltine crackers. Stated he still feels a little nausea. Informed him to not take the xeloda if diarrhea continues. Informed Pt this nurse will call him tomorrow to see how he is feeling and if he should have any problems over the weekend to call the triage line. Pt verbalized understanding.

## 2020-12-25 ENCOUNTER — Other Ambulatory Visit: Payer: Self-pay

## 2020-12-25 ENCOUNTER — Encounter (HOSPITAL_COMMUNITY): Payer: Self-pay

## 2020-12-25 ENCOUNTER — Emergency Department (HOSPITAL_COMMUNITY)
Admission: EM | Admit: 2020-12-25 | Discharge: 2020-12-25 | Disposition: A | Discharge status: Home / Self Care | Payer: 59 | Attending: Emergency Medicine | Admitting: Emergency Medicine

## 2020-12-25 DIAGNOSIS — R197 Diarrhea, unspecified: Secondary | ICD-10-CM | POA: Insufficient documentation

## 2020-12-25 DIAGNOSIS — R112 Nausea with vomiting, unspecified: Secondary | ICD-10-CM | POA: Insufficient documentation

## 2020-12-25 DIAGNOSIS — Z87891 Personal history of nicotine dependence: Secondary | ICD-10-CM | POA: Insufficient documentation

## 2020-12-25 DIAGNOSIS — R000 Tachycardia, unspecified: Secondary | ICD-10-CM | POA: Diagnosis not present

## 2020-12-25 DIAGNOSIS — I10 Essential (primary) hypertension: Secondary | ICD-10-CM | POA: Insufficient documentation

## 2020-12-25 DIAGNOSIS — E876 Hypokalemia: Secondary | ICD-10-CM

## 2020-12-25 DIAGNOSIS — Z85038 Personal history of other malignant neoplasm of large intestine: Secondary | ICD-10-CM | POA: Diagnosis not present

## 2020-12-25 DIAGNOSIS — Z79899 Other long term (current) drug therapy: Secondary | ICD-10-CM | POA: Insufficient documentation

## 2020-12-25 LAB — LIPASE, BLOOD: Lipase: 28 U/L (ref 11–51)

## 2020-12-25 LAB — COMPREHENSIVE METABOLIC PANEL
ALT: 21 U/L (ref 0–44)
AST: 22 U/L (ref 15–41)
Albumin: 3.6 g/dL (ref 3.5–5.0)
Alkaline Phosphatase: 49 U/L (ref 38–126)
Anion gap: 12 (ref 5–15)
BUN: 19 mg/dL (ref 8–23)
CO2: 22 mmol/L (ref 22–32)
Calcium: 8.7 mg/dL — ABNORMAL LOW (ref 8.9–10.3)
Chloride: 101 mmol/L (ref 98–111)
Creatinine, Ser: 1.06 mg/dL (ref 0.61–1.24)
GFR, Estimated: >60 mL/min (ref >60)
Glucose, Bld: 151 mg/dL — ABNORMAL HIGH (ref 70–99)
Potassium: 2.8 mmol/L — ABNORMAL LOW (ref 3.5–5.1)
Sodium: 135 mmol/L (ref 135–145)
Total Bilirubin: 1.3 mg/dL — ABNORMAL HIGH (ref 0.3–1.2)
Total Protein: 6.5 g/dL (ref 6.5–8.1)

## 2020-12-25 LAB — C DIFFICILE QUICK SCREEN W PCR REFLEX
C Diff antigen: NEGATIVE
C Diff interpretation: NOT DETECTED
C Diff toxin: NEGATIVE

## 2020-12-25 LAB — CBC WITH DIFFERENTIAL/PLATELET
Abs Immature Granulocytes: 0.03 10*3/uL (ref 0.00–0.07)
Basophils Absolute: 0 10*3/uL (ref 0.0–0.1)
Basophils Relative: 1 %
Eosinophils Absolute: 0 10*3/uL (ref 0.0–0.5)
Eosinophils Relative: 1 %
HCT: 44 % (ref 39.0–52.0)
Hemoglobin: 15.2 g/dL (ref 13.0–17.0)
Immature Granulocytes: 0 %
Lymphocytes Relative: 7 %
Lymphs Abs: 0.6 10*3/uL — ABNORMAL LOW (ref 0.7–4.0)
MCH: 27.4 pg (ref 26.0–34.0)
MCHC: 34.5 g/dL (ref 30.0–36.0)
MCV: 79.4 fL — ABNORMAL LOW (ref 80.0–100.0)
Monocytes Absolute: 0.8 10*3/uL (ref 0.1–1.0)
Monocytes Relative: 10 %
Neutro Abs: 6.7 10*3/uL (ref 1.7–7.7)
Neutrophils Relative %: 81 %
Platelets: 196 10*3/uL (ref 150–400)
RBC: 5.54 MIL/uL (ref 4.22–5.81)
RDW: 15.5 % (ref 11.5–15.5)
WBC: 8.2 10*3/uL (ref 4.0–10.5)
nRBC: 0 % (ref 0.0–0.2)

## 2020-12-25 LAB — TROPONIN I (HIGH SENSITIVITY)
Troponin I (High Sensitivity): 4 ng/L (ref <18)
Troponin I (High Sensitivity): 5 ng/L (ref <18)

## 2020-12-25 LAB — MAGNESIUM: Magnesium: 1.8 mg/dL (ref 1.7–2.4)

## 2020-12-25 MED ORDER — SODIUM CHLORIDE 0.9 % IV BOLUS
1000.0000 mL | Freq: Once | INTRAVENOUS | Status: AC
Start: 2020-12-25 — End: 2020-12-25
  Administered 2020-12-25: 1000 mL via INTRAVENOUS

## 2020-12-25 MED ORDER — POTASSIUM CHLORIDE CRYS ER 20 MEQ PO TBCR
40.0000 meq | EXTENDED_RELEASE_TABLET | Freq: Once | ORAL | Status: AC
  Administered 2020-12-25: 40 meq via ORAL
  Filled 2020-12-25: qty 2

## 2020-12-25 MED ORDER — ONDANSETRON HCL 4 MG/2ML IJ SOLN
4.0000 mg | Freq: Once | INTRAMUSCULAR | Status: AC
Start: 2020-12-25 — End: 2020-12-25
  Administered 2020-12-25: 4 mg via INTRAVENOUS
  Filled 2020-12-25: qty 2

## 2020-12-25 MED ORDER — POTASSIUM CHLORIDE CRYS ER 20 MEQ PO TBCR: 20.0000 meq | EXTENDED_RELEASE_TABLET | Freq: Every day | ORAL | 0 refills | Status: DC

## 2020-12-25 MED ORDER — ONDANSETRON HCL 4 MG/2ML IJ SOLN
4.0000 mg | Freq: Once | INTRAMUSCULAR | Status: AC
  Administered 2020-12-25: 4 mg via INTRAVENOUS
  Filled 2020-12-25: qty 2

## 2020-12-25 MED ORDER — POTASSIUM CHLORIDE 10 MEQ/100ML IV SOLN
10.0000 meq | INTRAVENOUS | Status: AC
  Administered 2020-12-25 (×2): 10 meq via INTRAVENOUS
  Filled 2020-12-25 (×2): qty 100

## 2020-12-25 MED ORDER — LOPERAMIDE HCL 2 MG PO CAPS
4.0000 mg | ORAL_CAPSULE | Freq: Once | ORAL | Status: AC
  Administered 2020-12-25: 4 mg via ORAL
  Filled 2020-12-25: qty 2

## 2020-12-25 NOTE — Discharge Instructions (Addendum)
Continue using your home nausea and diarrhea medications per your oncologist instructions.  Take potassium for the next 4 days as directed.  Follow-up with Dr. Benay Spice.  If you have worsening vomiting and diarrhea and are unable to keep down your medications at home please return to the emergency department.  Continue to hold your Xeloda until you follow-up with Dr. Benay Spice.

## 2020-12-25 NOTE — ED Provider Notes (Signed)
[facility] DEPT Provider Note   CSN: [id number] Arrival date & time: 12/25/20  0815     History Chief Complaint  Patient presents with   Vomiting    Tavious Griesinger is a 60 y.o. male.  Fletcher Ostermiller is a 60 y.o. male with a history of hypertension, hyperlipidemia and colon cancer currently on treatment, who presents to the emergency department for evaluation of dehydration.  Patient reports that he has been having persistent diarrhea, nausea and vomiting for the past 5 days after recently starting on oxaliplatin and Xeloda for treatment of colon cancer.  He is followed by Dr. Benay Spice with oncology.  Reports he is prescribed Zofran and Compazine as well as Imodium to help with the symptoms but has not been able to keep them down to get any relief.  He spoke to his oncologist office yesterday and they were going to try and get him in for IV fluids, but he was not able to get there in time before the clinic closed.  They have instructed him to discontinue Xeloda until symptoms are under control and he can follow-up in the clinic.  He has not had any associated fevers or chills.  Reports diarrhea has been foul-smelling but he has not had any melena or blood in the stool.  He denies associated abdominal pain.  Reports he feels generally weak and fatigued.  No other aggravating or alleviating factors.  The history is provided by the patient, the spouse and medical records.      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    Patient Active Problem List   Diagnosis Date Noted   Family history of breast cancer 11/24/2020   Family history of prostate cancer 11/24/2020   Colon cancer ([place]) 11/11/2020   Tubular adenoma of colon 10/21/2020   Rosacea blepharoconjunctivitis 05/26/2019    History of colonic polyps 02/25/2016   Screening for prostate cancer 02/25/2016   Encounter for health maintenance examination in adult 06/17/2015   Essential hypertension, benign 02/12/2013   Hyperlipidemia 02/12/2013    Past Surgical History:  Procedure Laterality Date   BILATERAL LASER VEIN SURGERY ON LEGS      COLONOSCOPY  2012   due 2017.   Dr. Henrene Pastor, pending f/u 06/2015   LAPAROSCOPIC RIGHT HEMI COLECTOMY N/A 11/11/2020   Procedure: LAPAROSCOPIC RIGHT HEMI COLECTOMY;  Surgeon: Ileana Roup, MD;  Location: [facility] ORS;  Service: General;  Laterality: N/A;   MOUTH SURGERY         Family History  Problem Relation Age of Onset   Arthritis Mother    Hyperlipidemia Mother    Heart disease Mother        42   Breast cancer Mother 68   Hypertension Father    COPD Father    Hyperlipidemia Father    Stroke Father    Aneurysm Father 71   Prostate cancer Father 39   Colon cancer Neg Hx    Esophageal cancer Neg Hx    Rectal cancer Neg Hx    Stomach cancer Neg Hx     Social History   Tobacco Use   Smoking status: Former    Types: Cigars   Smokeless tobacco: Never  Vaping Use  Vaping Use: Never used  Substance Use Topics   Alcohol use: Yes    Alcohol/week: 4.0 standard drinks    Types: 4 Standard drinks or equivalent per week    Comment: occasionally   Drug use: Never    Home Medications Prior to Admission medications   Medication Sig Start Date End Date Taking? Authorizing Provider  capecitabine (XELODA) 500 MG tablet Take 4 tablets (2,000 mg total) by mouth 2 (two) times daily after a meal. Take for 14 days, then hold for 7 days. Repeat every 21 days. 12/01/20  Yes Ladell Pier, MD  doxycycline (VIBRA-TABS) 100 MG tablet Take 1 tablet (100 mg total) by mouth 2 (two) times daily. Patient taking differently: Take 100 mg by mouth 2 (two) times daily as needed (rosacea). 08/12/20  Yes Denita Lung, MD  magic mouthwash SOLN Take 5-10 mLs by mouth 4 (four) times  daily as needed for mouth pain. Swish and spit 12/10/20  Yes Ladell Pier, MD  Melatonin 2.5 MG CHEW Chew 2.5 mg by mouth at bedtime as needed (sleep).   Yes [provider]  olmesartan-hydrochlorothiazide (BENICAR HCT) 20-12.5 MG tablet Take 1 tablet by mouth daily. 12/07/20  Yes Denita Lung, MD  omega-3 acid ethyl esters (LOVAZA) 1 g capsule TAKE 2 CAPSULES BY MOUTH TWICE A DAY Patient taking differently: Take 2 g by mouth 2 (two) times daily. 12/06/20  Yes Denita Lung, MD  ondansetron (ZOFRAN) 8 MG tablet Take 1 tablet (8 mg total) by mouth every 8 (eight) hours as needed for nausea or vomiting. Do not take until 72 hours after IV chemotherapy 12/10/20  Yes Ladell Pier, MD  prochlorperazine (COMPAZINE) 10 MG tablet Take 1 tablet (10 mg total) by mouth every 6 (six) hours as needed. 12/10/20  Yes Ladell Pier, MD  rosuvastatin (CRESTOR) 40 MG tablet Take 1 tablet (40 mg total) by mouth daily. 08/12/20  Yes Denita Lung, MD    Allergies    Patient has no known allergies.  Review of Systems   Review of Systems  Constitutional:  Positive for fatigue. Negative for chills and fever.  HENT: Negative.    Respiratory:  Negative for cough and shortness of breath.   Cardiovascular:  Negative for chest pain.  Gastrointestinal:  Positive for diarrhea, nausea and vomiting. Negative for abdominal pain and blood in stool.  Musculoskeletal:  Negative for arthralgias and myalgias.  Skin:  Negative for color change and wound.  Neurological:  Positive for weakness (generalized). Negative for syncope and light-headedness.  All other systems reviewed and are negative.  Physical Exam Updated Vital Signs BP (!) 130/94 (BP Location: Right Arm)   Pulse (!) 109   Temp 97.7 F (36.5 C) (Oral)   Resp 18   SpO2 100%   Physical Exam Vitals and nursing note reviewed.  Constitutional:      General: He is not in acute distress.    Appearance: Normal appearance. He is well-developed.  He is not diaphoretic.     Comments: Alert, somewhat ill-appearing but in no acute distress.  HENT:     Head: Normocephalic and atraumatic.     Mouth/Throat:     Mouth: Mucous membranes are dry.     Pharynx: Oropharynx is clear.  Eyes:     General:        Right eye: No discharge.        Left eye: No discharge.     Pupils: Pupils are equal, round,  and reactive to light.  Cardiovascular:     Rate and Rhythm: Regular rhythm. Tachycardia present.     Pulses: Normal pulses.     Heart sounds: Normal heart sounds.     Comments: Mild tachycardia with regular rhythm Pulmonary:     Effort: Pulmonary effort is normal. No respiratory distress.     Breath sounds: Normal breath sounds. No wheezing or rales.     Comments: Respirations equal and unlabored, patient able to speak in full sentences, lungs clear to auscultation bilaterally  Abdominal:     General: Bowel sounds are normal. There is no distension.     Palpations: Abdomen is soft. There is no mass.     Tenderness: There is no abdominal tenderness. There is no guarding.     Comments: Abdomen soft, nondistended, nontender to palpation in all quadrants without guarding or peritoneal signs  Musculoskeletal:        General: No deformity.     Cervical back: Neck supple.  Skin:    General: Skin is warm and dry.     Capillary Refill: Capillary refill takes less than 2 seconds.  Neurological:     Mental Status: He is alert and oriented to person, place, and time.     Coordination: Coordination normal.     Comments: Speech is clear, able to follow commands Moves extremities without ataxia, coordination intact  Psychiatric:        Mood and Affect: Mood normal.        Behavior: Behavior normal.    ED Results / Procedures / Treatments   Labs (all labs ordered are listed, but only abnormal results are displayed) Labs Reviewed  COMPREHENSIVE METABOLIC PANEL - Abnormal; Notable for the following components:      Result Value   Potassium 2.8  (*)    Glucose, Bld 151 (*)    Calcium 8.7 (*)    Total Bilirubin 1.3 (*)    All other components within normal limits  CBC WITH DIFFERENTIAL/PLATELET - Abnormal; Notable for the following components:   MCV 79.4 (*)    Lymphs Abs 0.6 (*)    All other components within normal limits  C DIFFICILE QUICK SCREEN W PCR REFLEX    LIPASE, BLOOD  MAGNESIUM  URINALYSIS, ROUTINE W REFLEX MICROSCOPIC  TROPONIN I (HIGH SENSITIVITY)    EKG EKG Interpretation  Date/Time:  Saturday December 25 2020 08:57:18 EDT Ventricular Rate:  100 PR Interval:  160 QRS Duration: 106 QT Interval:  331 QTC Calculation: 427 R Axis:   123 Text Interpretation: Sinus tachycardia No sig changes from prior ecg Oct 26 2020, minor ST depressions lateral leads Confirmed by Octaviano Glow [phone]) on 12/25/2020 9:04:44 AM  Radiology No results found.  Procedures Procedures   Medications Ordered in ED Medications  potassium chloride 10 mEq in 100 mL IVPB (10 mEq Intravenous New Bag/Given 12/25/20 1022)  sodium chloride 0.9 % bolus 1,000 mL (1,000 mLs Intravenous New Bag/Given (Non-Interop) 12/25/20 0914)  ondansetron (ZOFRAN) injection 4 mg (4 mg Intravenous Given 12/25/20 0914)  potassium chloride SA (KLOR-CON) CR tablet 40 mEq (40 mEq Oral Given 12/25/20 1020)    ED Course  I have reviewed the triage vital signs and the nursing notes.  Pertinent labs & imaging results that were available during my care of the patient were reviewed by me and considered in my medical decision making (see chart for details).  Clinical Course as of 12/25/20 1443  Sat Dec 26, 1591  2446 60 year old male with  a history of colon cancer on chemo infusions and oral chemotherapy presented ED with vomiting and diarrhea.  He reports been ongoing for a few weeks since he began his treatment.  His wife is present at bedside.  Patient reports has not been able to keep down anything for the past 2 days with persistent vomiting.  He had about  4 episodes of diarrhea which he reports is foul-smelling but nonbloody yesterday.  He denies any active abdominal pain.  He has been trying to use Zofran and Imodium but having a hard time keeping down any medications as he vomits them up.  His oncologist recommended he come to the ED for IV fluids and assessment.  On exam he was initially tachycardic, heart rate is improved.  He is afebrile.  He has a benign abdominal exam.  No focal tenderness on abdominal exam.  He does appear mildly dehydrated.  He has tacky mucous membranes.  He is receiving some IV fluids as well as Zofran right now.  We will check labs, I suspect may be some component of dehydration.  I personally interpreted his EKG shows a sinus rhythm with some possible ST depressions versus U waves in the inferior lateral leads, which could be consistent with electrolyte deficiency. [MT]  0930 He is not on antibiotics and has no prior history of C. difficile, however with his foul-smelling diarrhea and immunocompromised status, I think a C diff screen would be reasonable as well if he can provide stool [MT]    Clinical Course User Index [MT] Trifan, Carola Rhine, MD   MDM Rules/Calculators/A&P                           60 year old male presents for evaluation of nausea, vomiting and diarrhea.  Recently started chemotherapy for colon cancer and has been having worsening symptoms.  Unable to tolerate patient's home nausea and diarrhea medications and now feels like he is becoming increasingly dehydrated.  On arrival he was very mildly tachycardic and this improved.  Vitals otherwise stable.  No associated abdominal pain or chest pain.  Will check basic lab work and electrolytes.  EKG with some possible ST depressions versus U waves, suspect this is likely in the setting of electrolyte derangement but will also check troponin.  I have independently ordered, reviewed and interpreted all labs and imaging: CBC: No leukocytosis, normal hemoglobin CMP:  Potassium of 2.8 but no other significant electrolyte derangements, normal renal function, T bili of 1.3 but otherwise LFTs are unremarkable. Magnesium: WNL Lipase: WNL Troponin: Negative x2 C. difficile screening is negative.  Patient received 2 L of IV fluids, IV Zofran, and IV and p.o. potassium replacement.  He is now feeling much better, he has been able to tolerate p.o. fluids and was able to tolerate p.o. potassium and Imodium.  Will give 1 additional dose of IV nausea medication to help ensure patient does okay at home and patient would like to go home.  He will follow-up closely with his oncologist on Monday.  We will continue to use home Zofran, Compazine and Imodium.  We will also prescribe 4 days of potassium tablets for continued electrolyte replacement.  Strict return precautions provided.  Patient and wife expressed understanding and agreement.  Discharged home in good condition.   Final Clinical Impression(s) / ED Diagnoses Final diagnoses:  Nausea vomiting and diarrhea  Hypokalemia    Rx / DC Orders ED Discharge Orders  Ordered    potassium chloride SA (KLOR-CON) 20 MEQ tablet  Daily        12/25/20 1446             Jacqlyn Larsen, [place] 12/25/20 1455    Wyvonnia Dusky, MD 12/26/20 [phone]

## 2020-12-25 NOTE — ED Notes (Signed)
Pt ambulatory to restroom

## 2020-12-25 NOTE — ED Triage Notes (Signed)
Pt presents with c/o vomiting and diarrhea for several days. Pt is a cancer patient and was told to come here for IV fluids.

## 2020-12-26 ENCOUNTER — Encounter: Payer: Self-pay | Admitting: Oncology

## 2020-12-27 ENCOUNTER — Other Ambulatory Visit: Payer: Self-pay

## 2020-12-27 ENCOUNTER — Inpatient Hospital Stay: Payer: 59

## 2020-12-27 ENCOUNTER — Other Ambulatory Visit: Payer: Self-pay | Admitting: *Deleted

## 2020-12-27 ENCOUNTER — Inpatient Hospital Stay (HOSPITAL_BASED_OUTPATIENT_CLINIC_OR_DEPARTMENT_OTHER): Payer: 59 | Admitting: Nurse Practitioner

## 2020-12-27 ENCOUNTER — Telehealth: Payer: Self-pay | Admitting: *Deleted

## 2020-12-27 ENCOUNTER — Encounter: Payer: Self-pay | Admitting: Genetic Counselor

## 2020-12-27 VITALS — BP 113/70 | HR 81 | Temp 97.6°F | Resp 16

## 2020-12-27 DIAGNOSIS — Z5111 Encounter for antineoplastic chemotherapy: Secondary | ICD-10-CM | POA: Diagnosis not present

## 2020-12-27 DIAGNOSIS — C189 Malignant neoplasm of colon, unspecified: Secondary | ICD-10-CM

## 2020-12-27 DIAGNOSIS — Z1379 Encounter for other screening for genetic and chromosomal anomalies: Secondary | ICD-10-CM | POA: Insufficient documentation

## 2020-12-27 DIAGNOSIS — C9000 Multiple myeloma not having achieved remission: Secondary | ICD-10-CM

## 2020-12-27 LAB — CMP (CANCER CENTER ONLY)
ALT: 12 U/L (ref 0–44)
AST: 18 U/L (ref 15–41)
Albumin: 3.2 g/dL — ABNORMAL LOW (ref 3.5–5.0)
Alkaline Phosphatase: 36 U/L — ABNORMAL LOW (ref 38–126)
Anion gap: 12 (ref 5–15)
BUN: 16 mg/dL (ref 8–23)
CO2: 22 mmol/L (ref 22–32)
Calcium: 8.2 mg/dL — ABNORMAL LOW (ref 8.9–10.3)
Chloride: 99 mmol/L (ref 98–111)
Creatinine: 0.81 mg/dL (ref 0.61–1.24)
GFR, Estimated: >60 mL/min (ref >60)
Glucose, Bld: 138 mg/dL — ABNORMAL HIGH (ref 70–99)
Potassium: 2.9 mmol/L — ABNORMAL LOW (ref 3.5–5.1)
Sodium: 133 mmol/L — ABNORMAL LOW (ref 135–145)
Total Bilirubin: 0.9 mg/dL (ref 0.3–1.2)
Total Protein: 5.4 g/dL — ABNORMAL LOW (ref 6.5–8.1)

## 2020-12-27 LAB — CBC WITH DIFFERENTIAL (CANCER CENTER ONLY)
Abs Immature Granulocytes: 0 10*3/uL (ref 0.00–0.07)
Band Neutrophils: 17 %
Basophils Absolute: 0 10*3/uL (ref 0.0–0.1)
Basophils Relative: 0 %
Eosinophils Absolute: 0 10*3/uL (ref 0.0–0.5)
Eosinophils Relative: 0 %
HCT: 40.6 % (ref 39.0–52.0)
Hemoglobin: 14.6 g/dL (ref 13.0–17.0)
Lymphocytes Relative: 15 %
Lymphs Abs: 0.9 10*3/uL (ref 0.7–4.0)
MCH: 28 pg (ref 26.0–34.0)
MCHC: 36 g/dL (ref 30.0–36.0)
MCV: 77.8 fL — ABNORMAL LOW (ref 80.0–100.0)
Monocytes Absolute: 1.4 10*3/uL — ABNORMAL HIGH (ref 0.1–1.0)
Monocytes Relative: 25 %
Neutro Abs: 3.4 10*3/uL (ref 1.7–7.7)
Neutrophils Relative %: 43 %
Platelet Count: 203 10*3/uL (ref 150–400)
RBC: 5.22 MIL/uL (ref 4.22–5.81)
RDW: 15.9 % — ABNORMAL HIGH (ref 11.5–15.5)
WBC Count: 5.7 10*3/uL (ref 4.0–10.5)
WBC Morphology: INCREASED
nRBC: 0 % (ref 0.0–0.2)

## 2020-12-27 LAB — MAGNESIUM: Magnesium: 1.9 mg/dL (ref 1.7–2.4)

## 2020-12-27 LAB — GUARDANT 360

## 2020-12-27 MED ORDER — SODIUM CHLORIDE 0.9 % IV SOLN
10.0000 mg | Freq: Once | INTRAVENOUS | Status: AC
  Administered 2020-12-27: 10 mg via INTRAVENOUS
  Filled 2020-12-27: qty 1

## 2020-12-27 MED ORDER — SODIUM CHLORIDE 0.9 % IV SOLN: Freq: Once | INTRAVENOUS | Status: AC

## 2020-12-27 MED ORDER — SODIUM CHLORIDE 0.9 % IV SOLN: INTRAVENOUS | Status: DC

## 2020-12-27 MED ORDER — DIPHENOXYLATE-ATROPINE 2.5-0.025 MG PO TABS
1.0000 | ORAL_TABLET | Freq: Four times a day (QID) | ORAL | 0 refills | Status: DC | PRN
Start: 2020-12-27 — End: 2021-08-18

## 2020-12-27 MED ORDER — POTASSIUM CHLORIDE CRYS ER 20 MEQ PO TBCR: 20.0000 meq | EXTENDED_RELEASE_TABLET | Freq: Every day | ORAL | 1 refills | Status: DC

## 2020-12-27 MED ORDER — LORAZEPAM 0.5 MG PO TABS: 0.5000 mg | ORAL_TABLET | Freq: Three times a day (TID) | ORAL | 0 refills | Status: DC | PRN

## 2020-12-27 MED ORDER — POTASSIUM CHLORIDE 10 MEQ/100ML IV SOLN
10.0000 meq | INTRAVENOUS | Status: AC
  Administered 2020-12-27 (×2): 10 meq via INTRAVENOUS
  Filled 2020-12-27: qty 100

## 2020-12-27 NOTE — Patient Instructions (Signed)
Rehydration, Adult Rehydration is the replacement of body fluids, salts, and minerals (electrolytes) that are lost during dehydration. Dehydration is when there is not enough water or other fluids in the body. This happens when you lose more fluids than you take in. Common causes of dehydration include: Not drinking enough fluids. This can occur when you are ill or doing activities that require a lot of energy, especially in hot weather. Conditions that cause loss of water or other fluids, such as diarrhea, vomiting, sweating, or urinating a lot. Other illnesses, such as fever or infection. Certain medicines, such as those that remove excess fluid from the body (diuretics). Symptoms of mild or moderate dehydration may include thirst, dry lips and mouth, and dizziness. Symptoms of severe dehydration may include increased heart rate, confusion, fainting, and not urinating. For severe dehydration, you may need to get fluids through an IV at the hospital. For mild or moderate dehydration, you can usually rehydrate at home by drinking certain fluids as told by your health care provider. What are the risks? Generally, rehydration is safe. However, taking in too much fluid (overhydration) can be a problem. This is rare. Overhydration can cause an electrolyte imbalance, kidney failure, or a decrease in salt (sodium) levels in the body. Supplies needed You will need an oral rehydration solution (ORS) if your health care provider tells you to use one. This is a drink to treat dehydration. It can be found in pharmacies and retail stores. How to rehydrate Fluids Follow instructions from your health care provider for rehydration. The kind of fluid and the amount you should drink depend on your condition. In general, you should choose drinks that you prefer. If told by your health care provider, drink an ORS. Make an ORS by following instructions on the package. Start by drinking small amounts, about  cup (120  mL) every 5-10 minutes. Slowly increase how much you drink until you have taken the amount recommended by your health care provider. Drink enough clear fluids to keep your urine pale yellow. If you were told to drink an ORS, finish it first, then start slowly drinking other clear fluids. Drink fluids such as: Water. This includes sparkling water and flavored water. Drinking only water can lead to having too little sodium in your body (hyponatremia). Follow the advice of your health care provider. Water from ice chips you suck on. Fruit juice with water you add to it (diluted). Sports drinks. Hot or cold herbal teas. Broth-based soups. Milk or milk products. Food Follow instructions from your health care provider about what to eat while you rehydrate. Your health care provider may recommend that you slowly begin eating regular foods in small amounts. Eat foods that contain a healthy balance of electrolytes, such as bananas, oranges, potatoes, tomatoes, and spinach. Avoid foods that are greasy or contain a lot of sugar. In some cases, you may get nutrition through a feeding tube that is passed through your nose and into your stomach (nasogastric tube, or NG tube). This may be done if you have uncontrolled vomiting or diarrhea. Beverages to avoid Certain beverages may make dehydration worse. While you rehydrate, avoid drinking alcohol. How to tell if you are recovering from dehydration You may be recovering from dehydration if: You are urinating more often than before you started rehydrating. Your urine is pale yellow. Your energy level improves. You vomit less frequently. You have diarrhea less frequently. Your appetite improves or returns to normal. You feel less dizzy or less light-headed. Your  skin tone and color start to look more normal. Follow these instructions at home: Take over-the-counter and prescription medicines only as told by your health care provider. Do not take sodium  tablets. Doing this can lead to having too much sodium in your body (hypernatremia). Contact a health care provider if: You continue to have symptoms of mild or moderate dehydration, such as: Thirst. Dry lips. Slightly dry mouth. Dizziness. Dark urine or less urine than normal. Muscle cramps. You continue to vomit or have diarrhea. Get help right away if you: Have symptoms of dehydration that get worse. Have a fever. Have a severe headache. Have been vomiting and the following happens: Your vomiting gets worse or does not go away. Your vomit includes blood or green matter (bile). You cannot eat or drink without vomiting. Have problems with urination or bowel movements, such as: Diarrhea that gets worse or does not go away. Blood in your stool (feces). This may cause stool to look black and tarry. Not urinating, or urinating only a small amount of very dark urine, within 6-8 hours. Have trouble breathing. Have symptoms that get worse with treatment. These symptoms may represent a serious problem that is an emergency. Do not wait to see if the symptoms will go away. Get medical help right away. Call your local emergency services (911 in the [place] Do not drive yourself to the hospital. Summary Rehydration is the replacement of body fluids and minerals (electrolytes) that are lost during dehydration. Follow instructions from your health care provider for rehydration. The kind of fluid and amount you should drink depend on your condition. Slowly increase how much you drink until you have taken the amount recommended by your health care provider. Contact your health care provider if you continue to show signs of mild or moderate dehydration. This information is not intended to replace advice given to you by your health care provider. Make sure you discuss any questions you have with your health care provider. Document Revised: 04/30/2019 Document Reviewed: 03/10/2019 Elsevier Patient  Education  2022 [place].  Potassium Chloride Injection What is this medication? POTASSIUM CHLORIDE (poe TASS i um KLOOR ide) prevents and treats low levels of potassium in your body. Potassium plays an important role in maintaining the health of your kidneys, heart, muscles, and nervous system. This medicine may be used for other purposes; ask your health care provider or pharmacist if you have questions. COMMON BRAND NAME(S): PROAMP What should I tell my care team before I take this medication? They need to know if you have any of these conditions: Addison disease Dehydration Diabetes (high blood sugar) Heart disease High levels of potassium in the blood Irregular heartbeat or rhythm Kidney disease Large areas of burned skin An unusual or allergic reaction to potassium, other medications, foods, dyes, or preservatives Pregnant or trying to get pregnant Breast-feeding How should I use this medication? This medication is injected into a vein. It is given in a hospital or clinic setting. Talk to your care team about the use of this medication in children. Special care may be needed. Overdosage: If you think you have taken too much of this medicine contact a poison control center or emergency room at once. NOTE: This medicine is only for you. Do not share this medicine with others. What if I miss a dose? This does not apply. This medication is not for regular use. What may interact with this medication? Do not take this medication with any of the following: Certain diuretics  such as spironolactone, triamterene Eplerenone Sodium polystyrene sulfonate This medication may also interact with the following: Certain medications for blood pressure or heart disease like lisinopril, losartan, quinapril, valsartan Medications that lower your chance of fighting infection such as cyclosporine, tacrolimus NSAIDs, medications for pain and inflammation, like ibuprofen or naproxen Other  potassium supplements Salt substitutes This list may not describe all possible interactions. Give your health care provider a list of all the medicines, herbs, non-prescription drugs, or dietary supplements you use. Also tell them if you smoke, drink alcohol, or use illegal drugs. Some items may interact with your medicine. What should I watch for while using this medication? Visit your care team for regular checks on your progress. Tell your care team if your symptoms do not start to get better or if they get worse. You may need blood work while you are taking this medication. Avoid salt substitutes unless you are told otherwise by your care team. What side effects may I notice from receiving this medication? Side effects that you should report to your care team as soon as possible: Allergic reactions-skin rash, itching, hives, swelling of the face, lips, tongue, or throat High potassium level-muscle weakness, fast or irregular heartbeat Side effects that usually do not require medical attention (report to your care team if they continue or are bothersome): Diarrhea Nausea Stomach pain Vomiting This list may not describe all possible side effects. Call your doctor for medical advice about side effects. You may report side effects to FDA at 1-800-FDA-1088. Where should I keep my medication? This medication is given in a hospital or clinic. It will not be stored at home. NOTE: This sheet is a summary. It may not cover all possible information. If you have questions about this medicine, talk to your doctor, pharmacist, or health care provider.  2022 Elsevier/Gold Standard (2020-06-03 11:17:38)

## 2020-12-27 NOTE — Progress Notes (Signed)
[facility] OFFICE PROGRESS NOTE   Diagnosis: Colon cancer  INTERVAL HISTORY:   Dylan Fischer returns prior to scheduled follow-up due to nausea and diarrhea.  He began cycle 1 CAPOX 12/13/2020.  He developed nausea/vomiting on day 3.  No significant improvement with Zofran and Compazine.  He continues to have nausea.  He became constipated.  He began MiraLAX and then developed diarrhea.  The diarrhea has persisted.  He had multiple loose stools yesterday, 3 so far today.  He is taking up to 6 Imodium a day with no improvement.  He thinks he had a single mouth sore.  No hand or foot pain or redness.  Today he complains of abdominal cramps.  Appetite is poor.  Objective:  Vital signs in last 24 hours:  Temperature 97.6, heart rate 89, respirations 18, blood pressure 99/64    HEENT: Question resolving ulceration left lower lip.  Mucous membranes appear moist. Resp: Lungs clear bilaterally. Cardio: Regular rate and rhythm. GI: Abdomen is soft, mild generalized tenderness.  No apparent ascites. Vascular: No leg edema. Neuro: Alert and oriented. Skin: Skin turgor intact.   Lab Results:  Lab Results  Component Value Date   WBC 5.7 12/27/2020   HGB 14.6 12/27/2020   HCT 40.6 12/27/2020   MCV 77.8 (L) 12/27/2020   PLT 203 12/27/2020   NEUTROABS 3.4 12/27/2020    Imaging:  No results found.  Medications: I have reviewed the patient's current medications.  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, MSI-high, loss of MLH1 and PMS2 expression, negative for BRAF V600E mutation, MLH1 hypermethylation absent  colonoscopy 10/05/2020-near completely obstructing mass at the hepatic flexure-could not be passed, polyps in the sigmoid and transverse colon CTs 10/15/2020-short segment wall  thickening with adjacent inflammatory stranding at the hepatic flexure, prominent mesenteric nodes adjacent to the colonic neoplasm measuring up to 5 mm, prominent gastropathic, hepatoduodenal ligament, and right sided retroperitoneal nodes measured up to 7 mm, subcentimeter hypodense hypodensities-too small to characterize, tiny right middle lobe nodule 12/13/2020-ctDNA not detected Cycle 1 CAPOX 12/13/2020, Xeloda discontinued around 12/24/2020   Tubular adenoma on the colonoscopy 10/05/2020 and on colonoscopy 06/14/2010 Hypertension Ocular rosacea  Disposition: Dylan Fischer began cycle 1 CAPOX 12/13/2020.  Course has been complicated by persistent nausea and diarrhea.  Home medications have been ineffective.  He is receiving IV fluids with potassium in the office today.  He will receive a dose of Decadron 10 mg IV x 1.  Prescription sent to his pharmacy for Ativan 0.5 mg every 8 hours as needed for nausea.  C. difficile testing in the emergency department 12/25/2020 returned negative.  Prescription being sent to his pharmacy for Lomotil.  He will take K-Dur 20 meq daily beginning this evening.  Adding magnesium to today's labs.  He will return for repeat labs and additional IV fluids 12/28/2020.  We discussed changing treatment to the FOLFOX regimen with cycle 2.  Patient seen with Dr. Benay Fischer.    Ned Card ANP/GNP-BC   12/27/2020  2:40 PM This was a shared visit with Ned Card.  Dylan Fischer is now at day 15 following cycle 1 CAPOX.  He has developed significant toxicity from chemotherapy.  He has persistent nausea and diarrhea.  He appears dehydrated.  Capecitabine will remain on hold.  He will receive intravenous fluids and electrolyte replacement today.  He will return for IV fluids tomorrow.  Chemotherapy will be changed to FOLFOX.  We reviewed the schedule of FOLFOX regimen and need for central IV access with him today.  He agrees to proceed.  A FOLFOX chemotherapy plan was entered  today.  I was present for greater than 50% of today's visit.  I performed medical decision making.  Julieanne Manson, MD

## 2020-12-27 NOTE — Progress Notes (Signed)
Orders entered for Normal Saline at 25ml/hr for 11 liter pending lab results

## 2020-12-28 ENCOUNTER — Other Ambulatory Visit: Payer: Self-pay | Admitting: *Deleted

## 2020-12-28 ENCOUNTER — Other Ambulatory Visit: Payer: Self-pay | Admitting: Nurse Practitioner

## 2020-12-28 ENCOUNTER — Other Ambulatory Visit: Payer: Self-pay

## 2020-12-28 ENCOUNTER — Inpatient Hospital Stay: Payer: 59

## 2020-12-28 VITALS — BP 120/74 | HR 62 | Temp 98.0°F | Resp 20

## 2020-12-28 DIAGNOSIS — C9000 Multiple myeloma not having achieved remission: Secondary | ICD-10-CM

## 2020-12-28 DIAGNOSIS — E860 Dehydration: Secondary | ICD-10-CM

## 2020-12-28 DIAGNOSIS — C189 Malignant neoplasm of colon, unspecified: Secondary | ICD-10-CM

## 2020-12-28 LAB — CMP (CANCER CENTER ONLY)
ALT: 13 U/L (ref 0–44)
AST: 16 U/L (ref 15–41)
Albumin: 3.3 g/dL — ABNORMAL LOW (ref 3.5–5.0)
Alkaline Phosphatase: 39 U/L (ref 38–126)
Anion gap: 8 (ref 5–15)
BUN: 15 mg/dL (ref 8–23)
CO2: 28 mmol/L (ref 22–32)
Calcium: 8.8 mg/dL — ABNORMAL LOW (ref 8.9–10.3)
Chloride: 99 mmol/L (ref 98–111)
Creatinine: 0.74 mg/dL (ref 0.61–1.24)
GFR, Estimated: >60 mL/min (ref >60)
Glucose, Bld: 155 mg/dL — ABNORMAL HIGH (ref 70–99)
Potassium: 3.4 mmol/L — ABNORMAL LOW (ref 3.5–5.1)
Sodium: 135 mmol/L (ref 135–145)
Total Bilirubin: 0.6 mg/dL (ref 0.3–1.2)
Total Protein: 5.6 g/dL — ABNORMAL LOW (ref 6.5–8.1)

## 2020-12-28 LAB — CBC WITH DIFFERENTIAL (CANCER CENTER ONLY)
Abs Immature Granulocytes: 0.2 10*3/uL — ABNORMAL HIGH (ref 0.00–0.07)
Band Neutrophils: 8 %
Basophils Absolute: 0 10*3/uL (ref 0.0–0.1)
Basophils Relative: 0 %
Eosinophils Absolute: 0 10*3/uL (ref 0.0–0.5)
Eosinophils Relative: 0 %
HCT: 41.2 % (ref 39.0–52.0)
Hemoglobin: 14.4 g/dL (ref 13.0–17.0)
Lymphocytes Relative: 12 %
Lymphs Abs: 0.6 10*3/uL — ABNORMAL LOW (ref 0.7–4.0)
MCH: 27.3 pg (ref 26.0–34.0)
MCHC: 35 g/dL (ref 30.0–36.0)
MCV: 78.2 fL — ABNORMAL LOW (ref 80.0–100.0)
Metamyelocytes Relative: 1 %
Monocytes Absolute: 0.7 10*3/uL (ref 0.1–1.0)
Monocytes Relative: 15 %
Myelocytes: 4 %
Neutro Abs: 3.3 10*3/uL (ref 1.7–7.7)
Neutrophils Relative %: 60 %
Platelet Count: 236 10*3/uL (ref 150–400)
RBC: 5.27 MIL/uL (ref 4.22–5.81)
RDW: 16.7 % — ABNORMAL HIGH (ref 11.5–15.5)
Smear Review: INCREASED
WBC Count: 4.9 10*3/uL (ref 4.0–10.5)
nRBC: 0 % (ref 0.0–0.2)

## 2020-12-28 LAB — MAGNESIUM: Magnesium: 2.2 mg/dL (ref 1.7–2.4)

## 2020-12-28 MED ORDER — SODIUM CHLORIDE 0.9 % IV SOLN: INTRAVENOUS | Status: DC

## 2020-12-28 MED ORDER — SODIUM CHLORIDE 0.9 % IV SOLN: Freq: Once | INTRAVENOUS | Status: AC

## 2020-12-28 MED ORDER — POTASSIUM CHLORIDE 10 MEQ/100ML IV SOLN
10.0000 meq | INTRAVENOUS | Status: AC
  Administered 2020-12-28 (×2): 10 meq via INTRAVENOUS
  Filled 2020-12-28: qty 100

## 2020-12-28 NOTE — Progress Notes (Signed)
Order placed for PICC line per NP. Not tolerating peripheral CAPOX.

## 2020-12-28 NOTE — Progress Notes (Signed)
Labs entered per phlebotomy

## 2020-12-28 NOTE — Patient Instructions (Signed)
Rehydration, Adult Rehydration is the replacement of body fluids, salts, and minerals (electrolytes) that are lost during dehydration. Dehydration is when there is not enough water or other fluids in the body. This happens when you lose more fluids than you take in. Common causes of dehydration include: Not drinking enough fluids. This can occur when you are ill or doing activities that require a lot of energy, especially in hot weather. Conditions that cause loss of water or other fluids, such as diarrhea, vomiting, sweating, or urinating a lot. Other illnesses, such as fever or infection. Certain medicines, such as those that remove excess fluid from the body (diuretics). Symptoms of mild or moderate dehydration may include thirst, dry lips and mouth, and dizziness. Symptoms of severe dehydration may include increased heart rate, confusion, fainting, and not urinating. For severe dehydration, you may need to get fluids through an IV at the hospital. For mild or moderate dehydration, you can usually rehydrate at home by drinking certain fluids as told by your health care provider. What are the risks? Generally, rehydration is safe. However, taking in too much fluid (overhydration) can be a problem. This is rare. Overhydration can cause an electrolyte imbalance, kidney failure, or a decrease in salt (sodium) levels in the body. Supplies needed You will need an oral rehydration solution (ORS) if your health care provider tells you to use one. This is a drink to treat dehydration. It can be found in pharmacies and retail stores. How to rehydrate Fluids Follow instructions from your health care provider for rehydration. The kind of fluid and the amount you should drink depend on your condition. In general, you should choose drinks that you prefer. If told by your health care provider, drink an ORS. Make an ORS by following instructions on the package. Start by drinking small amounts, about  cup (120  mL) every 5-10 minutes. Slowly increase how much you drink until you have taken the amount recommended by your health care provider. Drink enough clear fluids to keep your urine pale yellow. If you were told to drink an ORS, finish it first, then start slowly drinking other clear fluids. Drink fluids such as: Water. This includes sparkling water and flavored water. Drinking only water can lead to having too little sodium in your body (hyponatremia). Follow the advice of your health care provider. Water from ice chips you suck on. Fruit juice with water you add to it (diluted). Sports drinks. Hot or cold herbal teas. Broth-based soups. Milk or milk products. Food Follow instructions from your health care provider about what to eat while you rehydrate. Your health care provider may recommend that you slowly begin eating regular foods in small amounts. Eat foods that contain a healthy balance of electrolytes, such as bananas, oranges, potatoes, tomatoes, and spinach. Avoid foods that are greasy or contain a lot of sugar. In some cases, you may get nutrition through a feeding tube that is passed through your nose and into your stomach (nasogastric tube, or NG tube). This may be done if you have uncontrolled vomiting or diarrhea. Beverages to avoid Certain beverages may make dehydration worse. While you rehydrate, avoid drinking alcohol. How to tell if you are recovering from dehydration You may be recovering from dehydration if: You are urinating more often than before you started rehydrating. Your urine is pale yellow. Your energy level improves. You vomit less frequently. You have diarrhea less frequently. Your appetite improves or returns to normal. You feel less dizzy or less light-headed. Your  skin tone and color start to look more normal. Follow these instructions at home: Take over-the-counter and prescription medicines only as told by your health care provider. Do not take sodium  tablets. Doing this can lead to having too much sodium in your body (hypernatremia). Contact a health care provider if: You continue to have symptoms of mild or moderate dehydration, such as: Thirst. Dry lips. Slightly dry mouth. Dizziness. Dark urine or less urine than normal. Muscle cramps. You continue to vomit or have diarrhea. Get help right away if you: Have symptoms of dehydration that get worse. Have a fever. Have a severe headache. Have been vomiting and the following happens: Your vomiting gets worse or does not go away. Your vomit includes blood or green matter (bile). You cannot eat or drink without vomiting. Have problems with urination or bowel movements, such as: Diarrhea that gets worse or does not go away. Blood in your stool (feces). This may cause stool to look black and tarry. Not urinating, or urinating only a small amount of very dark urine, within 6-8 hours. Have trouble breathing. Have symptoms that get worse with treatment. These symptoms may represent a serious problem that is an emergency. Do not wait to see if the symptoms will go away. Get medical help right away. Call your local emergency services (911 in the [place] Do not drive yourself to the hospital. Summary Rehydration is the replacement of body fluids and minerals (electrolytes) that are lost during dehydration. Follow instructions from your health care provider for rehydration. The kind of fluid and amount you should drink depend on your condition. Slowly increase how much you drink until you have taken the amount recommended by your health care provider. Contact your health care provider if you continue to show signs of mild or moderate dehydration. This information is not intended to replace advice given to you by your health care provider. Make sure you discuss any questions you have with your health care provider. Document Revised: 04/30/2019 Document Reviewed: 03/10/2019 Elsevier Patient  Education  2022 [place].  Potassium Chloride Injection What is this medication? POTASSIUM CHLORIDE (poe TASS i um KLOOR ide) prevents and treats low levels of potassium in your body. Potassium plays an important role in maintaining the health of your kidneys, heart, muscles, and nervous system. This medicine may be used for other purposes; ask your health care provider or pharmacist if you have questions. COMMON BRAND NAME(S): PROAMP What should I tell my care team before I take this medication? They need to know if you have any of these conditions: Addison disease Dehydration Diabetes (high blood sugar) Heart disease High levels of potassium in the blood Irregular heartbeat or rhythm Kidney disease Large areas of burned skin An unusual or allergic reaction to potassium, other medications, foods, dyes, or preservatives Pregnant or trying to get pregnant Breast-feeding How should I use this medication? This medication is injected into a vein. It is given in a hospital or clinic setting. Talk to your care team about the use of this medication in children. Special care may be needed. Overdosage: If you think you have taken too much of this medicine contact a poison control center or emergency room at once. NOTE: This medicine is only for you. Do not share this medicine with others. What if I miss a dose? This does not apply. This medication is not for regular use. What may interact with this medication? Do not take this medication with any of the following: Certain diuretics  such as spironolactone, triamterene Eplerenone Sodium polystyrene sulfonate This medication may also interact with the following: Certain medications for blood pressure or heart disease like lisinopril, losartan, quinapril, valsartan Medications that lower your chance of fighting infection such as cyclosporine, tacrolimus NSAIDs, medications for pain and inflammation, like ibuprofen or naproxen Other  potassium supplements Salt substitutes This list may not describe all possible interactions. Give your health care provider a list of all the medicines, herbs, non-prescription drugs, or dietary supplements you use. Also tell them if you smoke, drink alcohol, or use illegal drugs. Some items may interact with your medicine. What should I watch for while using this medication? Visit your care team for regular checks on your progress. Tell your care team if your symptoms do not start to get better or if they get worse. You may need blood work while you are taking this medication. Avoid salt substitutes unless you are told otherwise by your care team. What side effects may I notice from receiving this medication? Side effects that you should report to your care team as soon as possible: Allergic reactions-skin rash, itching, hives, swelling of the face, lips, tongue, or throat High potassium level-muscle weakness, fast or irregular heartbeat Side effects that usually do not require medical attention (report to your care team if they continue or are bothersome): Diarrhea Nausea Stomach pain Vomiting This list may not describe all possible side effects. Call your doctor for medical advice about side effects. You may report side effects to FDA at 1-800-FDA-1088. Where should I keep my medication? This medication is given in a hospital or clinic. It will not be stored at home. NOTE: This sheet is a summary. It may not cover all possible information. If you have questions about this medicine, talk to your doctor, pharmacist, or health care provider.  2022 Elsevier/Gold Standard (2020-06-03 11:17:38)

## 2020-12-28 NOTE — Progress Notes (Signed)
DISCONTINUE ON PATHWAY REGIMEN - Colorectal     A cycle is every 21 days:     Capecitabine      Oxaliplatin   **Always confirm dose/schedule in your pharmacy ordering system**  REASON: Toxicities / Adverse Event PRIOR TREATMENT: COS82: CapeOx q21 Days x 4 Cycles TREATMENT RESPONSE: Unable to Evaluate  START ON PATHWAY REGIMEN - Colorectal     A cycle is every 14 days:     Oxaliplatin      Leucovorin      Fluorouracil      Fluorouracil   **Always confirm dose/schedule in your pharmacy ordering system**  Patient Characteristics: Postoperative without Neoadjuvant Therapy (Pathologic Staging), Colon, Stage IIB/C Tumor Location: Colon Therapeutic Status: Postoperative without Neoadjuvant Therapy (Pathologic Staging) AJCC M Category: cM0 AJCC T Category: pT4a AJCC N Category: pN0 AJCC 8 Stage Grouping: IIB Intent of Therapy: Curative Intent, Discussed with Patient

## 2020-12-29 ENCOUNTER — Other Ambulatory Visit: Payer: Self-pay | Admitting: Nurse Practitioner

## 2020-12-29 ENCOUNTER — Encounter: Payer: Self-pay | Admitting: *Deleted

## 2020-12-29 ENCOUNTER — Other Ambulatory Visit: Payer: Self-pay | Admitting: Oncology

## 2020-12-29 ENCOUNTER — Telehealth: Payer: Self-pay | Admitting: Nurse Practitioner

## 2020-12-29 NOTE — Telephone Encounter (Signed)
I returned Dylan Fischer call regarding abdominal pain.  He describes the abdominal pain as "crampy".  He is passing gas.  He continues to have loose stools.  He estimates 4 over the past 24 hours.  No blood with bowel movements.  No fever.  The pain is better this afternoon as compared to last night.  He will try Gas-X, antacid.  Appointment offered.  He does not feel he needs to come in at present.  He understands to call the office/seek evaluation if current symptoms worsen, he develops fever or rectal bleeding.

## 2020-12-30 ENCOUNTER — Emergency Department (HOSPITAL_COMMUNITY): Payer: 59

## 2020-12-30 ENCOUNTER — Other Ambulatory Visit: Payer: Self-pay

## 2020-12-30 ENCOUNTER — Encounter (HOSPITAL_COMMUNITY): Payer: Self-pay | Admitting: Emergency Medicine

## 2020-12-30 ENCOUNTER — Inpatient Hospital Stay (HOSPITAL_COMMUNITY)
Admission: EM | Admit: 2020-12-30 | Discharge: 2021-01-05 | DRG: 394 | Disposition: A | Discharge status: Home / Self Care | Payer: 59 | Attending: Family Medicine | Admitting: Family Medicine

## 2020-12-30 ENCOUNTER — Telehealth: Payer: Self-pay | Admitting: Genetic Counselor

## 2020-12-30 DIAGNOSIS — K1230 Oral mucositis (ulcerative), unspecified: Secondary | ICD-10-CM | POA: Diagnosis present

## 2020-12-30 DIAGNOSIS — K529 Noninfective gastroenteritis and colitis, unspecified: Secondary | ICD-10-CM | POA: Diagnosis present

## 2020-12-30 DIAGNOSIS — Z9049 Acquired absence of other specified parts of digestive tract: Secondary | ICD-10-CM

## 2020-12-30 DIAGNOSIS — C189 Malignant neoplasm of colon, unspecified: Secondary | ICD-10-CM | POA: Diagnosis present

## 2020-12-30 DIAGNOSIS — Z83438 Family history of other disorder of lipoprotein metabolism and other lipidemia: Secondary | ICD-10-CM

## 2020-12-30 DIAGNOSIS — E785 Hyperlipidemia, unspecified: Secondary | ICD-10-CM | POA: Diagnosis present

## 2020-12-30 DIAGNOSIS — Z8249 Family history of ischemic heart disease and other diseases of the circulatory system: Secondary | ICD-10-CM

## 2020-12-30 DIAGNOSIS — E876 Hypokalemia: Secondary | ICD-10-CM | POA: Diagnosis present

## 2020-12-30 DIAGNOSIS — Z20822 Contact with and (suspected) exposure to covid-19: Secondary | ICD-10-CM | POA: Diagnosis present

## 2020-12-30 DIAGNOSIS — Z85038 Personal history of other malignant neoplasm of large intestine: Secondary | ICD-10-CM | POA: Diagnosis present

## 2020-12-30 DIAGNOSIS — L719 Rosacea, unspecified: Secondary | ICD-10-CM | POA: Diagnosis present

## 2020-12-30 DIAGNOSIS — C184 Malignant neoplasm of transverse colon: Secondary | ICD-10-CM | POA: Diagnosis present

## 2020-12-30 DIAGNOSIS — Z87891 Personal history of nicotine dependence: Secondary | ICD-10-CM

## 2020-12-30 DIAGNOSIS — T451X5A Adverse effect of antineoplastic and immunosuppressive drugs, initial encounter: Secondary | ICD-10-CM | POA: Diagnosis present

## 2020-12-30 DIAGNOSIS — E440 Moderate protein-calorie malnutrition: Secondary | ICD-10-CM | POA: Diagnosis present

## 2020-12-30 DIAGNOSIS — R1084 Generalized abdominal pain: Secondary | ICD-10-CM

## 2020-12-30 DIAGNOSIS — Z79899 Other long term (current) drug therapy: Secondary | ICD-10-CM

## 2020-12-30 DIAGNOSIS — R112 Nausea with vomiting, unspecified: Secondary | ICD-10-CM

## 2020-12-30 DIAGNOSIS — E871 Hypo-osmolality and hyponatremia: Secondary | ICD-10-CM | POA: Diagnosis present

## 2020-12-30 DIAGNOSIS — I700 Atherosclerosis of aorta: Secondary | ICD-10-CM | POA: Diagnosis present

## 2020-12-30 DIAGNOSIS — E860 Dehydration: Secondary | ICD-10-CM | POA: Diagnosis present

## 2020-12-30 DIAGNOSIS — I10 Essential (primary) hypertension: Secondary | ICD-10-CM | POA: Diagnosis present

## 2020-12-30 DIAGNOSIS — K521 Toxic gastroenteritis and colitis: Principal | ICD-10-CM | POA: Diagnosis present

## 2020-12-30 DIAGNOSIS — Z6826 Body mass index (BMI) 26.0-26.9, adult: Secondary | ICD-10-CM

## 2020-12-30 LAB — CBC WITH DIFFERENTIAL/PLATELET
Abs Immature Granulocytes: 0.25 10*3/uL — ABNORMAL HIGH (ref 0.00–0.07)
Basophils Absolute: 0 10*3/uL (ref 0.0–0.1)
Basophils Relative: 1 %
Eosinophils Absolute: 0 10*3/uL (ref 0.0–0.5)
Eosinophils Relative: 1 %
HCT: 41.1 % (ref 39.0–52.0)
Hemoglobin: 14.1 g/dL (ref 13.0–17.0)
Immature Granulocytes: 5 %
Lymphocytes Relative: 8 %
Lymphs Abs: 0.4 10*3/uL — ABNORMAL LOW (ref 0.7–4.0)
MCH: 27.5 pg (ref 26.0–34.0)
MCHC: 34.3 g/dL (ref 30.0–36.0)
MCV: 80.1 fL (ref 80.0–100.0)
Monocytes Absolute: 0.9 10*3/uL (ref 0.1–1.0)
Monocytes Relative: 16 %
Neutro Abs: 3.6 10*3/uL (ref 1.7–7.7)
Neutrophils Relative %: 69 %
Platelets: 247 10*3/uL (ref 150–400)
RBC: 5.13 MIL/uL (ref 4.22–5.81)
RDW: 16.4 % — ABNORMAL HIGH (ref 11.5–15.5)
WBC: 5.2 10*3/uL (ref 4.0–10.5)
nRBC: 0 % (ref 0.0–0.2)

## 2020-12-30 LAB — COMPREHENSIVE METABOLIC PANEL
ALT: 23 U/L (ref 0–44)
AST: 24 U/L (ref 15–41)
Albumin: 2.5 g/dL — ABNORMAL LOW (ref 3.5–5.0)
Alkaline Phosphatase: 44 U/L (ref 38–126)
Anion gap: 10 (ref 5–15)
BUN: 11 mg/dL (ref 8–23)
CO2: 26 mmol/L (ref 22–32)
Calcium: 7.9 mg/dL — ABNORMAL LOW (ref 8.9–10.3)
Chloride: 98 mmol/L (ref 98–111)
Creatinine, Ser: 0.97 mg/dL (ref 0.61–1.24)
GFR, Estimated: >60 mL/min (ref >60)
Glucose, Bld: 122 mg/dL — ABNORMAL HIGH (ref 70–99)
Potassium: 3 mmol/L — ABNORMAL LOW (ref 3.5–5.1)
Sodium: 134 mmol/L — ABNORMAL LOW (ref 135–145)
Total Bilirubin: 1.3 mg/dL — ABNORMAL HIGH (ref 0.3–1.2)
Total Protein: 5.1 g/dL — ABNORMAL LOW (ref 6.5–8.1)

## 2020-12-30 LAB — LIPASE, BLOOD: Lipase: 43 U/L (ref 11–51)

## 2020-12-30 LAB — MAGNESIUM: Magnesium: 1.9 mg/dL (ref 1.7–2.4)

## 2020-12-30 MED ORDER — DIPHENOXYLATE-ATROPINE 2.5-0.025 MG PO TABS
1.0000 | ORAL_TABLET | Freq: Four times a day (QID) | ORAL | Status: DC | PRN
  Administered 2020-12-30 – 2021-01-01 (×4): 1 via ORAL
  Filled 2020-12-30 (×4): qty 1

## 2020-12-30 MED ORDER — ONDANSETRON HCL 4 MG/2ML IJ SOLN
4.0000 mg | Freq: Once | INTRAMUSCULAR | Status: AC
  Administered 2020-12-30: 4 mg via INTRAVENOUS
  Filled 2020-12-30: qty 2

## 2020-12-30 MED ORDER — LORAZEPAM 0.5 MG PO TABS: 0.5000 mg | ORAL_TABLET | Freq: Three times a day (TID) | ORAL | Status: DC | PRN

## 2020-12-30 MED ORDER — METRONIDAZOLE 500 MG/100ML IV SOLN
500.0000 mg | Freq: Once | INTRAVENOUS | Status: AC
  Administered 2020-12-30: 500 mg via INTRAVENOUS
  Filled 2020-12-30: qty 100

## 2020-12-30 MED ORDER — PROCHLORPERAZINE EDISYLATE 10 MG/2ML IJ SOLN
5.0000 mg | INTRAMUSCULAR | Status: DC | PRN
Start: 2020-12-30 — End: 2020-12-30

## 2020-12-30 MED ORDER — ONDANSETRON HCL 4 MG/2ML IJ SOLN
4.0000 mg | Freq: Four times a day (QID) | INTRAMUSCULAR | Status: DC | PRN
  Administered 2020-12-31 – 2021-01-02 (×5): 4 mg via INTRAVENOUS
  Filled 2020-12-30 (×6): qty 2

## 2020-12-30 MED ORDER — SODIUM CHLORIDE 0.9 % IV SOLN
2.0000 g | Freq: Once | INTRAVENOUS | Status: AC
  Administered 2020-12-30: 2 g via INTRAVENOUS
  Filled 2020-12-30: qty 2

## 2020-12-30 MED ORDER — ENOXAPARIN SODIUM 40 MG/0.4ML IJ SOSY
40.0000 mg | PREFILLED_SYRINGE | INTRAMUSCULAR | Status: DC
  Administered 2020-12-31 – 2021-01-04 (×5): 40 mg via SUBCUTANEOUS
  Filled 2020-12-30 (×6): qty 0.4

## 2020-12-30 MED ORDER — SODIUM CHLORIDE 0.9 % IV SOLN: 2.0000 g | Freq: Two times a day (BID) | INTRAVENOUS | Status: DC

## 2020-12-30 MED ORDER — MORPHINE SULFATE (PF) 4 MG/ML IV SOLN
4.0000 mg | INTRAVENOUS | Status: DC | PRN
  Administered 2020-12-30 (×2): 4 mg via INTRAVENOUS
  Filled 2020-12-30 (×2): qty 1

## 2020-12-30 MED ORDER — LACTATED RINGERS IV SOLN: INTRAVENOUS | Status: DC

## 2020-12-30 MED ORDER — ACETAMINOPHEN 650 MG RE SUPP: 650.0000 mg | Freq: Four times a day (QID) | RECTAL | Status: DC | PRN

## 2020-12-30 MED ORDER — POTASSIUM CHLORIDE CRYS ER 20 MEQ PO TBCR
20.0000 meq | EXTENDED_RELEASE_TABLET | Freq: Once | ORAL | Status: AC
  Administered 2020-12-30: 20 meq via ORAL
  Filled 2020-12-30: qty 1

## 2020-12-30 MED ORDER — PANTOPRAZOLE SODIUM 40 MG IV SOLR
40.0000 mg | INTRAVENOUS | Status: DC
  Administered 2020-12-30 – 2020-12-31 (×2): 40 mg via INTRAVENOUS
  Filled 2020-12-30 (×2): qty 40

## 2020-12-30 MED ORDER — ONDANSETRON HCL 4 MG PO TABS
4.0000 mg | ORAL_TABLET | Freq: Four times a day (QID) | ORAL | Status: DC | PRN
  Filled 2020-12-30: qty 1

## 2020-12-30 MED ORDER — MORPHINE SULFATE (PF) 2 MG/ML IV SOLN
2.0000 mg | Freq: Once | INTRAVENOUS | Status: AC
  Administered 2020-12-30: 2 mg via INTRAVENOUS
  Filled 2020-12-30: qty 1

## 2020-12-30 MED ORDER — HYDROMORPHONE HCL 1 MG/ML IJ SOLN
0.5000 mg | INTRAMUSCULAR | Status: DC | PRN
  Administered 2020-12-30 – 2020-12-31 (×2): 0.5 mg via INTRAVENOUS
  Filled 2020-12-30 (×2): qty 0.5

## 2020-12-30 MED ORDER — LACTATED RINGERS IV BOLUS
1000.0000 mL | Freq: Once | INTRAVENOUS | Status: AC
  Administered 2020-12-30: 1000 mL via INTRAVENOUS

## 2020-12-30 MED ORDER — SODIUM CHLORIDE 0.9 % IV SOLN
2.0000 g | Freq: Three times a day (TID) | INTRAVENOUS | Status: DC
  Administered 2020-12-30 – 2021-01-02 (×8): 2 g via INTRAVENOUS
  Filled 2020-12-30 (×9): qty 2

## 2020-12-30 MED ORDER — ACETAMINOPHEN 325 MG PO TABS
650.0000 mg | ORAL_TABLET | Freq: Four times a day (QID) | ORAL | Status: DC | PRN
  Administered 2020-12-30: 650 mg via ORAL
  Filled 2020-12-30: qty 2

## 2020-12-30 MED ORDER — POTASSIUM CHLORIDE IN NACL 20-0.9 MEQ/L-% IV SOLN
INTRAVENOUS | Status: AC
  Filled 2020-12-30: qty 1000

## 2020-12-30 MED ORDER — IOHEXOL 350 MG/ML SOLN
80.0000 mL | Freq: Once | INTRAVENOUS | Status: AC | PRN
  Administered 2020-12-30: 80 mL via INTRAVENOUS

## 2020-12-30 MED ORDER — MORPHINE SULFATE (PF) 4 MG/ML IV SOLN
4.0000 mg | Freq: Once | INTRAVENOUS | Status: AC
  Administered 2020-12-30: 4 mg via INTRAVENOUS
  Filled 2020-12-30: qty 1

## 2020-12-30 MED ORDER — METOCLOPRAMIDE HCL 5 MG/ML IJ SOLN
5.0000 mg | Freq: Four times a day (QID) | INTRAMUSCULAR | Status: DC
  Administered 2020-12-30 – 2021-01-01 (×8): 5 mg via INTRAVENOUS
  Filled 2020-12-30 (×8): qty 2

## 2020-12-30 MED ORDER — METRONIDAZOLE 500 MG/100ML IV SOLN
500.0000 mg | Freq: Two times a day (BID) | INTRAVENOUS | Status: DC
  Administered 2020-12-31 – 2021-01-01 (×3): 500 mg via INTRAVENOUS
  Filled 2020-12-30 (×3): qty 100

## 2020-12-30 MED ORDER — ENOXAPARIN SODIUM 40 MG/0.4ML IJ SOSY: 40.0000 mg | PREFILLED_SYRINGE | INTRAMUSCULAR | Status: DC

## 2020-12-30 MED ORDER — CALCIUM CARBONATE ANTACID 500 MG PO CHEW: 1.0000 | CHEWABLE_TABLET | Freq: Every day | ORAL | Status: DC | PRN

## 2020-12-30 MED ORDER — POTASSIUM CHLORIDE 10 MEQ/100ML IV SOLN
10.0000 meq | INTRAVENOUS | Status: AC
  Administered 2020-12-30 (×2): 10 meq via INTRAVENOUS
  Filled 2020-12-30 (×2): qty 100

## 2020-12-30 NOTE — Progress Notes (Signed)
Pt noted to have spiked a 101.1 temp. Tylenol administered. Charge nurse and MD notified.

## 2020-12-30 NOTE — Telephone Encounter (Signed)
Pt answered and hung up.  I then noticed he had been admitted this morning.  I will wait to call after he has been released.

## 2020-12-30 NOTE — H&P (Signed)
History and Physical    Dylan Fischer GLO:[id number] DOB: 01/22/1961 DOA: 12/30/2020  PCP: Pcp, No  Patient coming from: Home.  I have personally briefly reviewed patient's old medical records in [place]  Chief Complaint: Abdominal pain, vomiting and diarrhea.  HPI: Dylan Fischer is a 60 y.o. male with medical history significant of colon polyp, hyperlipidemia, hypertension, ocular rosacea, colon cancer on CAPOX who has been having abdominal pain, multiple episodes of diarrhea daily, frequent emesis, lightheadedness, fatigue and malaise for almost 2 weeks since he had his last chemotherapy cycle.  He already has been to the ER and to the cancer center for IV hydration in the past 5 days.  Today he returns to the emergency department as he is having difficulty controlling his symptoms at home.  He has had rigors and chills, but no subjective fever.  No flank pain, melena, hematochezia, dysuria, frequency or hematuria.  No rhinorrhea, sore throat, wheezing or hemoptysis.  He has been mildly dyspneic at times.  No chest pain, palpitations, diaphoresis, PND, orthopnea or pitting edema of the lower extremities.  Denied polyuria, polydipsia, polyphagia or blurred vision.  ED Course: Initial vital signs were temperature 98.9 F, pulse 109, respiration 14, BP 127/91 mmHg O2 sat 100% on room air.  The patient received a 1000 mL LR bolus, 2 g of cefepime IVPB, 500 mg of metronidazole morphine 4 mg IVP and then morphine 2 mg IVP, ondansetron 4 mg IVP x2, 20 mEq of IV KCl and 20 mEq orally.  Lab work: His CBC showed a white count of 5.2, hemoglobin 14.1 g/dL and platelets 247.  Sodium 134, potassium 3.0 mmol/L.  The rest of the electrolytes are normal when calcium is corrected to albumin.  Renal function was normal.  Total protein was 5.1 and albumin 2.5 g/dL.  Total bilirubin was 1.3 mg/dL.  Transaminases and lipase were normal.  Imaging: CT abdomen/pelvis with contrast showed marked wall  thickening and mucosal hyperemia in the distal small bowel consistent with nonspecific enteritis.  There was an interval partial colectomy for colonic malignancy resection without evidence of complication at the anastomotic site.  There were scattered subcentimeter upper abdominal lymph nodes which are without change.  There is also aortic atherosclerosis.  Review of Systems: As per HPI otherwise all other systems reviewed and are negative.  Past Medical History:  Diagnosis Date   Cancer [facility])    Colonic polyp    Family history of breast cancer    Family history of prostate cancer    Hyperlipidemia    Hypertension    Ocular rosacea    on Doxycycline long term    Past Surgical History:  Procedure Laterality Date   BILATERAL LASER VEIN SURGERY ON LEGS      COLONOSCOPY  2012   due 2017.   Dr. Henrene Pastor, pending f/u 06/2015   LAPAROSCOPIC RIGHT HEMI COLECTOMY N/A 11/11/2020   Procedure: LAPAROSCOPIC RIGHT HEMI COLECTOMY;  Surgeon: Ileana Roup, MD;  Location: [facility];  Service: General;  Laterality: N/A;   MOUTH SURGERY     Social History  reports that he has quit smoking. His smoking use included cigars. He has never used smokeless tobacco. He reports current alcohol use of about 4.0 standard drinks per week. He reports that he does not use drugs.  No Known Allergies  Family History  Problem Relation Age of Onset   Arthritis Mother    Hyperlipidemia Mother    Heart disease Mother  75   Breast cancer Mother 35   Hypertension Father    COPD Father    Hyperlipidemia Father    Stroke Father    Aneurysm Father 78   Prostate cancer Father 23   Colon cancer Neg Hx    Esophageal cancer Neg Hx    Rectal cancer Neg Hx    Stomach cancer Neg Hx    Prior to Admission medications   Medication Sig Start Date End Date Taking? Authorizing Provider  acetaminophen (TYLENOL) 500 MG tablet Take 1,000 mg by mouth every 6 (six) hours as needed for mild pain.   Yes [provider]  calcium carbonate (TUMS - DOSED IN MG ELEMENTAL CALCIUM) 500 MG chewable tablet Chew 1 tablet by mouth daily as needed for indigestion or heartburn.   Yes [provider]  capecitabine (XELODA) 500 MG tablet Take 4 tablets (2,000 mg total) by mouth 2 (two) times daily after a meal. Take for 14 days, then hold for 7 days. Repeat every 21 days. 12/01/20  Yes Ladell Pier, MD  diphenoxylate-atropine (LOMOTIL) 2.5-0.025 MG tablet Take 1 tablet by mouth 4 (four) times daily as needed for diarrhea or loose stools. 12/27/20  Yes Owens Shark, NP  doxycycline (VIBRA-TABS) 100 MG tablet Take 1 tablet (100 mg total) by mouth 2 (two) times daily. Patient taking differently: Take 100 mg by mouth 2 (two) times daily as needed (rosacea). 08/12/20  Yes Denita Lung, MD  LORazepam (ATIVAN) 0.5 MG tablet Take 1 tablet (0.5 mg total) by mouth every 8 (eight) hours as needed (for nausea). 12/27/20  Yes Owens Shark, NP  olmesartan-hydrochlorothiazide (BENICAR HCT) 20-12.5 MG tablet Take 1 tablet by mouth daily. 12/07/20  Yes Denita Lung, MD  omega-3 acid ethyl esters (LOVAZA) 1 g capsule TAKE 2 CAPSULES BY MOUTH TWICE A DAY Patient taking differently: Take 2 g by mouth 2 (two) times daily. 12/06/20  Yes Denita Lung, MD  ondansetron (ZOFRAN) 8 MG tablet Take 1 tablet (8 mg total) by mouth every 8 (eight) hours as needed for nausea or vomiting. Do not take until 72 hours after IV chemotherapy 12/10/20  Yes Ladell Pier, MD  potassium chloride SA (KLOR-CON) 20 MEQ tablet Take 1 tablet (20 mEq total) by mouth daily. 12/27/20  Yes Owens Shark, NP  prochlorperazine (COMPAZINE) 10 MG tablet Take 1 tablet (10 mg total) by mouth every 6 (six) hours as needed. Patient taking differently: Take 10 mg by mouth every 6 (six) hours as needed for vomiting. 12/10/20  Yes Ladell Pier, MD  rosuvastatin (CRESTOR) 40 MG tablet Take 1 tablet (40 mg total) by mouth daily. 08/12/20  Yes Denita Lung, MD   Simethicone (GAS-X PO) Take 1 tablet by mouth daily as needed (abdominal pain).   Yes [provider]  magic mouthwash SOLN Take 5-10 mLs by mouth 4 (four) times daily as needed for mouth pain. Swish and spit Patient not taking: No sig reported 12/10/20   Ladell Pier, MD   Physical Exam: Vitals:   12/30/20 1215 12/30/20 1315 12/30/20 1345 12/30/20 1415  BP: (!) 102/57 129/66 112/73 101/76  Pulse: 81 80 87 92  Resp: 17 14 15  (!) 21  Temp:  98.2 F (36.8 C)    TempSrc:  Oral    SpO2: 94% 99% 100% 96%   Constitutional: Looks acutely ill. Eyes: PERRL, lids and conjunctivae normal.  Sclera mildly injected. ENMT: Mucous membranes are mildly dry.  Posterior pharynx clear of any exudate or lesions. Neck: normal, supple, no masses, no thyromegaly Respiratory: clear to auscultation bilaterally, no wheezing, no crackles. Normal respiratory effort. No accessory muscle use.  Cardiovascular: Regular rate and rhythm, no murmurs / rubs / gallops. No extremity edema. 2+ pedal pulses. No carotid bruits.  Abdomen: No distention.  Bowel sounds positive.  Soft, mild diffuse tenderness without guarding or rebound, no masses palpated. No hepatosplenomegaly. Musculoskeletal: no clubbing / cyanosis.  Good ROM, no contractures. Normal muscle tone.  Skin: Facial skin is very erythematous.  Otherwise no acute rashes/lesions on very limited dermatological examination. Neurologic: CN 2-12 grossly intact. Sensation intact, DTR normal. Strength 5/5 in all 4.  Psychiatric: Normal judgment and insight. Alert and oriented x 3. Normal mood.   Labs on Admission: I have personally reviewed following labs and imaging studies  CBC: Recent Labs  Lab 12/25/20 0915 12/27/20 1254 12/28/20 1116 12/30/20 0732  WBC 8.2 5.7 4.9 5.2  NEUTROABS 6.7 3.4 3.3 3.6  HGB 15.2 14.6 14.4 14.1  HCT 44.0 40.6 41.2 41.1  MCV 79.4* 77.8* 78.2* 80.1  PLT 196 203 236 102    Basic Metabolic Panel: Recent Labs  Lab  12/25/20 0915 12/27/20 1254 12/28/20 1116 12/30/20 0732  NA 135 133* 135 134*  K 2.8* 2.9* 3.4* 3.0*  CL 101 99 99 98  CO2 22 22 28 26   GLUCOSE 151* 138* 155* 122*  BUN 19 16 15 11   CREATININE 1.06 0.81 0.74 0.97  CALCIUM 8.7* 8.2* 8.8* 7.9*  MG 1.8 1.9 2.2 1.9   GFR: Estimated Creatinine Clearance: 98.2 mL/min (by C-G formula based on SCr of 0.97 mg/dL).  Liver Function Tests: Recent Labs  Lab 12/25/20 0915 12/27/20 1254 12/28/20 1116 12/30/20 0732  AST 22 18 16 24   ALT 21 12 13 23   ALKPHOS 49 36* 39 44  BILITOT 1.3* 0.9 0.6 1.3*  PROT 6.5 5.4* 5.6* 5.1*  ALBUMIN 3.6 3.2* 3.3* 2.5*   Radiological Exams on Admission: CT ABDOMEN PELVIS W CONTRAST  Result Date: 12/30/2020 CLINICAL DATA:  Acute abdominal pain. History of colon cancer, started chemotherapy 2 weeks ago EXAM: CT ABDOMEN AND PELVIS WITH CONTRAST TECHNIQUE: Multidetector CT imaging of the abdomen and pelvis was performed using the standard protocol following bolus administration of intravenous contrast. CONTRAST:  9mL OMNIPAQUE IOHEXOL 350 MG/ML SOLN COMPARISON:  CT abdomen/pelvis 10/15/2020 FINDINGS: Lower chest: There is minimal bibasilar subsegmental atelectasis. The imaged heart is unremarkable. Hepatobiliary: A subcentimeter hypodense lesion in the right hepatic lobe near the dome is unchanged, too small to characterize. The liver is otherwise unremarkable. The gallbladder is unremarkable. There is no biliary ductal dilatation. Pancreas: Unremarkable. Spleen: Unremarkable. Adrenals/Urinary Tract: The adrenals are unremarkable. Subcentimeter hypodense lesions in the kidneys are unchanged, again too small to characterize but likely reflect small cysts. The kidneys are otherwise unremarkable, with no other focal lesions. There are no stones. There is no hydronephrosis or hydroureter. Stomach/Bowel: The stomach is unremarkable. There has been interval partial colectomy for colonic malignancy resection. There is no  evidence of complication at the anastomotic site. There is marked wall thickening involving the distal small bowel with associated mucosal hyperemia and surrounding fat stranding consistent with infectious or inflammatory enteritis. There is no pneumatosis intestinalis. There is no evidence of bowel obstruction. Vascular/Lymphatic: There is scattered calcified atherosclerotic plaque throughout the nonaneurysmal abdominal aorta. The major branch vessels are patent. The main portal and splenic veins are patent. There is no portal venous gas. Scattered subcentimeter  periportal lymph nodes measuring up to 6-7 mm are unchanged. There is no new or progressive lymphadenopathy in the abdomen or pelvis. Reproductive: The prostate and seminal vesicles are unremarkable. Other: There is small volume free fluid in the pelvis, likely reactive. There is no free intraperitoneal air. Musculoskeletal: There is no acute osseous abnormality or aggressive osseous lesion. IMPRESSION: 1. Marked wall thickening and mucosal hyperemia in the distal small bowel consistent with nonspecific infectious/inflammatory enteritis. Differential would include medication side effect given recent initiation of chemotherapy. 2. Interval partial colectomy for colonic malignancy resection without evidence of complication at the anastomotic site. 3. Scattered subcentimeter upper abdominal lymph nodes are unchanged. No new or progressive lymphadenopathy in the abdomen or pelvis. Aortic Atherosclerosis (ICD10-I70.0). Electronically Signed   By: Valetta Mole M.D.   On: 12/30/2020 09:43    EKG: Independently reviewed.  Vent. rate 88 BPM PR interval 166 ms QRS duration 95 ms QT/QTcB 388/470 ms P-R-T axes 41 107 16 Sinus rhythm Right axis deviation Low voltage, extremity and precordial leads Baseline wander in lead(s) V6  Assessment/Plan Principal Problem:   Enteritis Likely chemo induced.   Observation/telemetry. Continue IV fluids. Continue IV  antibiotics. Replenish electrolytes. Analgesics as needed. Antiemetics as needed. Protonix IV every 24 hours. Check procalcitonin in AM.  Active Problems:   Hypokalemia Supplementing. Follow-up potassium level in AM.    Hyponatremia Secondary to GI losses. Continue IV fluids. Follow sodium level in AM.    Moderate protein malnutrition (HCC) Due to poor oral intake. Consider protein supplementation.    Essential hypertension, benign Earlier BP readings were soft. Hold telmisartan and hydrochlorothiazide. Monitor blood pressure, renal function electrolytes.    Hyperlipidemia   Aortic atherosclerosis ([place]) On rosuvastatin. Resume once clinically improved.    Colon cancer ([place]) Significant side effects on current treatment. Oncology will switch treatment from CAPOX to FOLFOX.    DVT prophylaxis: Lovenox SQ. Code Status:   Full code. Family Communication:  His spouse was with him in the ED room. Disposition Plan:   Patient is from:  Home.  Anticipated DC to:  Home.  Anticipated DC date:  12/31/2020 or 01/01/2021.  Anticipated DC barriers: Clinical status.  Consults called:   Admission status:  Observation/telemetry.  Severity of Illness:  High severity after having gastroenteritis symptoms following his last chemotherapy cycle.  The patient will be observed for 24 to 48 hours for symptoms treatment, IV hydration and electrolyte replenishment.  Reubin Milan MD [facility]  How to contact the [facility] Attending or Consulting provider [place] or covering provider during after hours [place], for this patient?   Check the care team in [facility] and look for a) attending/consulting [facility] provider listed and b) the [facility] team listed Log into [email or web address] and use [place]'s universal password to access. If you do not have the password, please contact the hospital operator. Locate the [facility] provider you are looking for under [facility] and page to a number that you  can be directly reached. If you still have difficulty reaching the provider, please page the [facility] (Director on Call) for the Hospitalists listed on amion for assistance.  12/30/2020, 2:30 PM   This document was prepared using Dragon voice recognition software and may contain some unintended transcription errors.

## 2020-12-30 NOTE — ED Provider Notes (Signed)
[place] DEPT Provider Note   CSN: [id number] Arrival date & time: 12/30/20  0715     History Chief Complaint  Patient presents with   Abdominal Pain    Dylan Fischer is a 60 y.o. male.  60 year old male with history of colon cancer presents today for evaluation of persistent nausea and vomiting along with abdominal pain.  Patient on 10/3 received a cycle 1 of CAPOX and since then has had persistent nausea and vomiting requiring multiple visits to the clinic for IV fluids, potassium repletion and has been trialed on multiple medications without relief.  During his last visit to his oncology office on 10/17 he also had complaint of abdominal pain and since then he reports his abdominal pain has worsened.  He does state that his diarrhea has slowed down since initiation of Lomotil on 10/17 to now 2-3 bowel movements per day.  States he has not been able to keep much of any solids or liquids down but is able to tolerate only water.  He denies fever, chills, dyspnea, chest pain, lightheadedness, urinary symptoms.  Discussion with wife reveals patient is in significant pain most of the time even with just changing positions.  Has not tolerated much of any p.o. intake other than small amounts of water throughout the day.  He has not been able to take his potassium supplements he was discharged with a few days ago.  Aside from water he tried to eat small amounts of broth yesterday which he was unable to keep down.  The history is provided by the patient. No language interpreter was used.      Past Medical History:  Diagnosis Date   Cancer [facility])    Colonic polyp    Family history of breast cancer    Family history of prostate cancer    Hyperlipidemia    Hypertension    Ocular rosacea    on Doxycycline long term    Patient Active Problem List   Diagnosis Date Noted   Genetic testing 12/27/2020   Family history of breast cancer 11/24/2020   Family  history of prostate cancer 11/24/2020   Colon cancer ([place]) 11/11/2020   Tubular adenoma of colon 10/21/2020   Rosacea blepharoconjunctivitis 05/26/2019   History of colonic polyps 02/25/2016   Screening for prostate cancer 02/25/2016   Encounter for health maintenance examination in adult 06/17/2015   Essential hypertension, benign 02/12/2013   Hyperlipidemia 02/12/2013    Past Surgical History:  Procedure Laterality Date   BILATERAL LASER VEIN SURGERY ON LEGS      COLONOSCOPY  2012   due 2017.   Dr. Henrene Pastor, pending f/u 06/2015   LAPAROSCOPIC RIGHT HEMI COLECTOMY N/A 11/11/2020   Procedure: LAPAROSCOPIC RIGHT HEMI COLECTOMY;  Surgeon: Ileana Roup, MD;  Location: [facility] ORS;  Service: General;  Laterality: N/A;   MOUTH SURGERY         Family History  Problem Relation Age of Onset   Arthritis Mother    Hyperlipidemia Mother    Heart disease Mother        65   Breast cancer Mother 41   Hypertension Father    COPD Father    Hyperlipidemia Father    Stroke Father    Aneurysm Father 43   Prostate cancer Father 36   Colon cancer Neg Hx    Esophageal cancer Neg Hx    Rectal cancer Neg Hx    Stomach cancer Neg Hx     Social History  Tobacco Use   Smoking status: Former    Types: Cigars   Smokeless tobacco: Never  Vaping Use   Vaping Use: Never used  Substance Use Topics   Alcohol use: Yes    Alcohol/week: 4.0 standard drinks    Types: 4 Standard drinks or equivalent per week    Comment: occasionally   Drug use: Never    Home Medications Prior to Admission medications   Medication Sig Start Date End Date Taking? Authorizing Provider  capecitabine (XELODA) 500 MG tablet Take 4 tablets (2,000 mg total) by mouth 2 (two) times daily after a meal. Take for 14 days, then hold for 7 days. Repeat every 21 days. 12/01/20   Ladell Pier, MD  diphenoxylate-atropine (LOMOTIL) 2.5-0.025 MG tablet Take 1 tablet by mouth 4 (four) times daily as needed for diarrhea or loose  stools. 12/27/20   Owens Shark, NP  doxycycline (VIBRA-TABS) 100 MG tablet Take 1 tablet (100 mg total) by mouth 2 (two) times daily. Patient taking differently: Take 100 mg by mouth 2 (two) times daily as needed (rosacea). 08/12/20   Denita Lung, MD  LORazepam (ATIVAN) 0.5 MG tablet Take 1 tablet (0.5 mg total) by mouth every 8 (eight) hours as needed (for nausea). 12/27/20   Owens Shark, NP  magic mouthwash SOLN Take 5-10 mLs by mouth 4 (four) times daily as needed for mouth pain. Swish and spit 12/10/20   Ladell Pier, MD  Melatonin 2.5 MG CHEW Chew 2.5 mg by mouth at bedtime as needed (sleep).    [provider]  olmesartan-hydrochlorothiazide (BENICAR HCT) 20-12.5 MG tablet Take 1 tablet by mouth daily. 12/07/20   Denita Lung, MD  omega-3 acid ethyl esters (LOVAZA) 1 g capsule TAKE 2 CAPSULES BY MOUTH TWICE A DAY Patient taking differently: Take 2 g by mouth 2 (two) times daily. 12/06/20   Denita Lung, MD  ondansetron (ZOFRAN) 8 MG tablet Take 1 tablet (8 mg total) by mouth every 8 (eight) hours as needed for nausea or vomiting. Do not take until 72 hours after IV chemotherapy 12/10/20   Ladell Pier, MD  potassium chloride SA (KLOR-CON) 20 MEQ tablet Take 1 tablet (20 mEq total) by mouth daily. 12/27/20   Owens Shark, NP  prochlorperazine (COMPAZINE) 10 MG tablet Take 1 tablet (10 mg total) by mouth every 6 (six) hours as needed. 12/10/20   Ladell Pier, MD  rosuvastatin (CRESTOR) 40 MG tablet Take 1 tablet (40 mg total) by mouth daily. 08/12/20   Denita Lung, MD    Allergies    Patient has no known allergies.  Review of Systems   Review of Systems  Constitutional:  Positive for appetite change. Negative for activity change, chills and fever.  Respiratory:  Negative for shortness of breath.   Cardiovascular:  Negative for chest pain.  Gastrointestinal:  Positive for abdominal pain, diarrhea, nausea and vomiting.  Genitourinary:  Negative for  difficulty urinating.  Neurological:  Negative for light-headedness.  All other systems reviewed and are negative.  Physical Exam Updated Vital Signs BP (!) 127/91 (BP Location: Left Arm)   Pulse (!) 109   Temp 98.9 F (37.2 C) (Oral)   Resp 14   SpO2 100%   Physical Exam Vitals and nursing note reviewed.  Constitutional:      General: He is not in acute distress.    Appearance: Normal appearance.     Comments: Uncomfortable appearing  HENT:  Head: Normocephalic and atraumatic.     Nose: Nose normal.  Eyes:     General: No scleral icterus.    Extraocular Movements: Extraocular movements intact.     Conjunctiva/sclera: Conjunctivae normal.  Cardiovascular:     Rate and Rhythm: Regular rhythm.     Pulses: Normal pulses.     Heart sounds: Normal heart sounds.  Pulmonary:     Effort: Pulmonary effort is normal. No respiratory distress.     Breath sounds: Normal breath sounds. No wheezing or rales.  Abdominal:     General: There is no distension.     Tenderness: There is generalized abdominal tenderness. There is guarding. There is no right CVA tenderness or left CVA tenderness.  Musculoskeletal:        General: Normal range of motion.     Cervical back: Normal range of motion.  Skin:    General: Skin is warm and dry.  Neurological:     General: No focal deficit present.     Mental Status: He is alert. Mental status is at baseline.    ED Results / Procedures / Treatments   Labs (all labs ordered are listed, but only abnormal results are displayed) Labs Reviewed  CBC WITH DIFFERENTIAL/PLATELET  COMPREHENSIVE METABOLIC PANEL  LIPASE, BLOOD  MAGNESIUM    EKG None  Radiology No results found.  Procedures Procedures   Medications Ordered in ED Medications  morphine 4 MG/ML injection 4 mg (has no administration in time range)  ondansetron (ZOFRAN) injection 4 mg (has no administration in time range)  lactated ringers bolus 1,000 mL (has no administration  in time range)    ED Course  I have reviewed the triage vital signs and the nursing notes.  Pertinent labs & imaging results that were available during my care of the patient were reviewed by me and considered in my medical decision making (see chart for details).    MDM Rules/Calculators/A&P                           60 year old male presents today for evaluation of abdominal pain, and persistent nausea, vomiting, and diarrhea associated with his chemo regimen.  He was recently seen in the oncology office on 10/17 and has been given multiple fluid boluses, IV medicines and prescribed Ativan for additional management of his nausea without success.  He returns today for persistent nausea vomiting and unable to tolerate any p.o. intake.  He has found to be hypokalemic today with potassium of 3.0.  Repletion ordered.  IV fluids given.  CT abdomen pelvis significant for likely infectious/inflammatory enteritis in the distal small bowel, otherwise negative for acute processes.   Case discussed with on-call oncologist who recommends starting broad-spectrum antibiotic coverage for the enteritis, sending off a GI panel.  Given intractable nausea and vomiting will discuss case with hospitalist for admission.    Final Clinical Impression(s) / ED Diagnoses Final diagnoses:  None    Rx / DC Orders ED Discharge Orders     None        Evlyn Courier, PA-C 12/30/20 1235    Tegeler, Gwenyth Allegra, MD 12/30/20 [phone]

## 2020-12-30 NOTE — ED Triage Notes (Signed)
Patient reports starting chemotherapy 2 weeks ago and started having side effects including abdominal pain, emesis and diarrhea. Patient has been seen previously for same issue to get IV fluids.

## 2020-12-31 ENCOUNTER — Other Ambulatory Visit (HOSPITAL_COMMUNITY): Payer: Self-pay | Admitting: Physician Assistant

## 2020-12-31 ENCOUNTER — Encounter: Payer: Self-pay | Admitting: Oncology

## 2020-12-31 DIAGNOSIS — E785 Hyperlipidemia, unspecified: Secondary | ICD-10-CM | POA: Diagnosis present

## 2020-12-31 DIAGNOSIS — E440 Moderate protein-calorie malnutrition: Secondary | ICD-10-CM

## 2020-12-31 DIAGNOSIS — E876 Hypokalemia: Secondary | ICD-10-CM | POA: Diagnosis present

## 2020-12-31 DIAGNOSIS — Z9049 Acquired absence of other specified parts of digestive tract: Secondary | ICD-10-CM | POA: Diagnosis not present

## 2020-12-31 DIAGNOSIS — L719 Rosacea, unspecified: Secondary | ICD-10-CM | POA: Diagnosis present

## 2020-12-31 DIAGNOSIS — E860 Dehydration: Secondary | ICD-10-CM | POA: Diagnosis present

## 2020-12-31 DIAGNOSIS — C184 Malignant neoplasm of transverse colon: Secondary | ICD-10-CM

## 2020-12-31 DIAGNOSIS — T451X5A Adverse effect of antineoplastic and immunosuppressive drugs, initial encounter: Secondary | ICD-10-CM | POA: Diagnosis present

## 2020-12-31 DIAGNOSIS — I700 Atherosclerosis of aorta: Secondary | ICD-10-CM | POA: Diagnosis present

## 2020-12-31 DIAGNOSIS — K1230 Oral mucositis (ulcerative), unspecified: Secondary | ICD-10-CM | POA: Diagnosis present

## 2020-12-31 DIAGNOSIS — K529 Noninfective gastroenteritis and colitis, unspecified: Secondary | ICD-10-CM | POA: Diagnosis present

## 2020-12-31 DIAGNOSIS — Z8249 Family history of ischemic heart disease and other diseases of the circulatory system: Secondary | ICD-10-CM | POA: Diagnosis not present

## 2020-12-31 DIAGNOSIS — Z83438 Family history of other disorder of lipoprotein metabolism and other lipidemia: Secondary | ICD-10-CM | POA: Diagnosis not present

## 2020-12-31 DIAGNOSIS — Z87891 Personal history of nicotine dependence: Secondary | ICD-10-CM | POA: Diagnosis not present

## 2020-12-31 DIAGNOSIS — E871 Hypo-osmolality and hyponatremia: Secondary | ICD-10-CM | POA: Diagnosis present

## 2020-12-31 DIAGNOSIS — Z6826 Body mass index (BMI) 26.0-26.9, adult: Secondary | ICD-10-CM | POA: Diagnosis not present

## 2020-12-31 DIAGNOSIS — I10 Essential (primary) hypertension: Secondary | ICD-10-CM | POA: Diagnosis present

## 2020-12-31 DIAGNOSIS — Z79899 Other long term (current) drug therapy: Secondary | ICD-10-CM | POA: Diagnosis not present

## 2020-12-31 DIAGNOSIS — K521 Toxic gastroenteritis and colitis: Secondary | ICD-10-CM | POA: Diagnosis present

## 2020-12-31 DIAGNOSIS — Z20822 Contact with and (suspected) exposure to covid-19: Secondary | ICD-10-CM | POA: Diagnosis present

## 2020-12-31 LAB — COMPREHENSIVE METABOLIC PANEL
ALT: 23 U/L (ref 0–44)
AST: 23 U/L (ref 15–41)
Albumin: 2.1 g/dL — ABNORMAL LOW (ref 3.5–5.0)
Alkaline Phosphatase: 41 U/L (ref 38–126)
Anion gap: 10 (ref 5–15)
BUN: 11 mg/dL (ref 8–23)
CO2: 24 mmol/L (ref 22–32)
Calcium: 7.4 mg/dL — ABNORMAL LOW (ref 8.9–10.3)
Chloride: 100 mmol/L (ref 98–111)
Creatinine, Ser: 0.8 mg/dL (ref 0.61–1.24)
GFR, Estimated: >60 mL/min (ref >60)
Glucose, Bld: 116 mg/dL — ABNORMAL HIGH (ref 70–99)
Potassium: 3.1 mmol/L — ABNORMAL LOW (ref 3.5–5.1)
Sodium: 134 mmol/L — ABNORMAL LOW (ref 135–145)
Total Bilirubin: 1.3 mg/dL — ABNORMAL HIGH (ref 0.3–1.2)
Total Protein: 4.6 g/dL — ABNORMAL LOW (ref 6.5–8.1)

## 2020-12-31 LAB — CBC
HCT: 39 % (ref 39.0–52.0)
Hemoglobin: 13.2 g/dL (ref 13.0–17.0)
MCH: 27.3 pg (ref 26.0–34.0)
MCHC: 33.8 g/dL (ref 30.0–36.0)
MCV: 80.6 fL (ref 80.0–100.0)
Platelets: 240 10*3/uL (ref 150–400)
RBC: 4.84 MIL/uL (ref 4.22–5.81)
RDW: 16.4 % — ABNORMAL HIGH (ref 11.5–15.5)
WBC: 7.4 10*3/uL (ref 4.0–10.5)
nRBC: 0 % (ref 0.0–0.2)

## 2020-12-31 LAB — GASTROINTESTINAL PANEL BY PCR, STOOL (REPLACES STOOL CULTURE)

## 2020-12-31 LAB — RESP PANEL BY RT-PCR (FLU A&B, COVID) ARPGX2
Influenza A by PCR: NEGATIVE
Influenza B by PCR: NEGATIVE
SARS Coronavirus 2 by RT PCR: NEGATIVE

## 2020-12-31 LAB — MAGNESIUM: Magnesium: 1.8 mg/dL (ref 1.7–2.4)

## 2020-12-31 LAB — HIV ANTIBODY (ROUTINE TESTING W REFLEX): HIV Screen 4th Generation wRfx: NONREACTIVE

## 2020-12-31 MED ORDER — ADULT MULTIVITAMIN W/MINERALS CH
1.0000 | ORAL_TABLET | Freq: Every day | ORAL | Status: DC
  Administered 2021-01-01 – 2021-01-05 (×5): 1 via ORAL
  Filled 2020-12-31 (×6): qty 1

## 2020-12-31 MED ORDER — POTASSIUM CHLORIDE 10 MEQ/100ML IV SOLN
10.0000 meq | INTRAVENOUS | Status: AC
  Administered 2020-12-31 (×6): 10 meq via INTRAVENOUS
  Filled 2020-12-31 (×6): qty 100

## 2020-12-31 MED ORDER — MAGIC MOUTHWASH W/LIDOCAINE
5.0000 mL | Freq: Four times a day (QID) | ORAL | Status: DC
  Administered 2020-12-31 – 2021-01-02 (×7): 5 mL via ORAL
  Filled 2020-12-31 (×14): qty 5

## 2020-12-31 MED ORDER — HYDROMORPHONE HCL 1 MG/ML IJ SOLN
1.0000 mg | INTRAMUSCULAR | Status: DC | PRN
  Administered 2020-12-31 – 2021-01-03 (×14): 1 mg via INTRAVENOUS
  Filled 2020-12-31 (×15): qty 1

## 2020-12-31 MED ORDER — SODIUM CHLORIDE 0.9 % IV SOLN: INTRAVENOUS | Status: DC

## 2020-12-31 MED ORDER — HYOSCYAMINE SULFATE 0.125 MG SL SUBL
0.1250 mg | SUBLINGUAL_TABLET | Freq: Four times a day (QID) | SUBLINGUAL | Status: DC | PRN
  Administered 2020-12-31: 0.125 mg via SUBLINGUAL
  Filled 2020-12-31 (×2): qty 1

## 2020-12-31 MED ORDER — KATE FARMS STANDARD 1.4 PO LIQD
325.0000 mL | Freq: Two times a day (BID) | ORAL | Status: DC
  Administered 2020-12-31: 325 mL via ORAL
  Filled 2020-12-31 (×11): qty 325

## 2020-12-31 NOTE — Progress Notes (Signed)
Pharmacist Chemotherapy Monitoring - Initial Assessment    Anticipated start date: 01/04/21   The following has been reviewed per standard work regarding the patient's treatment regimen: The patient's diagnosis, treatment plan and drug doses, and organ/hematologic function Lab orders and baseline tests specific to treatment regimen  The treatment plan start date, drug sequencing, and pre-medications Prior authorization status  Patient's documented medication list, including drug-drug interaction screen and prescriptions for anti-emetics and supportive care specific to the treatment regimen The drug concentrations, fluid compatibility, administration routes, and timing of the medications to be used The patient's access for treatment and lifetime cumulative dose history, if applicable  The patient's medication allergies and previous infusion related reactions, if applicable   Changes made to treatment plan:  N/A  Follow up needed:  N/A   Dylan Fischer, [facility], 12/31/2020  10:46 AM

## 2020-12-31 NOTE — Progress Notes (Signed)
PROGRESS NOTE   Dylan Fischer  NUU:[id number]    DOB: 12-Oct-1960    DOA: 12/30/2020  PCP: Pcp, No   I have briefly reviewed patients previous medical [facility].  Chief Complaint  Patient presents with   Abdominal Pain    Brief Narrative:  60 year old married male with medical history significant for Colon cancer, s/p laparoscopic right hemicolectomy 11/11/2020, started cycle 1 of CAPOX 12/13/2020, developed nausea and vomiting 1 day 3, then became constipated followed by persistent/intractable diarrhea and abdominal pain.  Symptoms ongoing for almost 2 weeks.  Associated with lightheadedness, fatigue, malaise, low-grade fevers, poor oral intake.  Seen in the ED and cancer center for IV hydration for the past 5 days prior to admission.  Also has PMH of hypertension, hyperlipidemia.  CT abdomen showed findings consistent with nonspecific enteritis.  Admitted for acute gastro enteritis,?  Infectious versus related to recent chemotherapy, associated dehydration, and electrolyte abnormalities.  Assessment & Plan:  Principal Problem:   Enteritis Active Problems:   Essential hypertension, benign   Hyperlipidemia   Colon cancer (HCC)   Hypokalemia   Hyponatremia   Moderate protein malnutrition (HCC)   Aortic atherosclerosis (HCC)   Acute gastroenteritis - Symptoms ongoing for almost 2 weeks since his first cycle of CAPOX treatment 12/13/2020 which is now on hold. - C. difficile testing 12/25/2020 negative. - GI panel PCR pending. - CTA A/P with contrast 10/20: Marked wall thickening and mucosal hyperemia in the distal small bowel consistent with nonspecific infectious/inflammatory enteritis.  No evidence of complication of recent partial colectomy anastomotic site. - Treat supportively, continue full liquids and advance diet as tolerated, IV fluids, IV cefepime and Flagyl, pain control, added Levsin as needed for cramps, IV PPI, scheduled IV Reglan and as needed  antiemetics. - If he does not improve in the next 24 hours or worsens, consider [facility] consultation.  Dehydration with hyponatremia - Secondary to poor oral intake.   - Encourage oral fluids and continue IV fluids.  Hypokalemia, persistent - Likely related to GI losses and poor oral intake - Replace IV aggressively.  Check magnesium and replace as needed. Follow BMP.  Essential hypertension - Controlled off of antihypertensives.  Likely related to volume depletion. - Continue to hold telmisartan and HCTZ.  Hyperlipidemia/aortic atherosclerosis - Rosuvastatin on hold, resume when able to tolerate p.o. well  Colon cancer, s/p laparoscopic right hemicolectomy on chemotherapy - Outpatient follow-up with oncology. - Alerted oncologist of patient's hospital admission.  Moderate protein calorie malnutrition - We will get dietitian consultation for input.  Body mass index is 26.43 kg/m.    DVT prophylaxis: enoxaparin (LOVENOX) injection 40 mg Start: 12/31/20 1000     Code Status: Full Code Family Communication: Discussed in detail with patient spouse at bedside, updated care and answered all questions Disposition:  Status is: Observation  The patient will require care spanning > 2 midnights and should be moved to inpatient because: Ongoing significant abdominal pain, poor oral intake, dehydration requiring IV fluids, enteritis on IV antibiotics.       Consultants:   None.  Procedures:   None.  Antimicrobials:    Anti-infectives (From admission, onward)    Start     Dose/Rate Route Frequency Ordered Stop   12/31/20 0100  metroNIDAZOLE (FLAGYL) IVPB 500 mg        500 mg 100 mL/hr over 60 Minutes Intravenous Every 12 hours 12/30/20 1233     12/30/20 2200  ceFEPIme (MAXIPIME) 2 g in sodium  chloride 0.9 % 100 mL IVPB  Status:  Discontinued       Note to Pharmacy: Please adjust dosage as needed.  Thank you.   2 g 200 mL/hr over 30 Minutes Intravenous Every 12 hours  12/30/20 1233 12/30/20 1236   12/30/20 2100  ceFEPIme (MAXIPIME) 2 g in sodium chloride 0.9 % 100 mL IVPB       Note to Pharmacy: Please adjust dosage as needed.  Thank you.   2 g 200 mL/hr over 30 Minutes Intravenous Every 8 hours 12/30/20 1237     12/30/20 1230  ceFEPIme (MAXIPIME) 2 g in sodium chloride 0.9 % 100 mL IVPB        2 g 200 mL/hr over 30 Minutes Intravenous  Once 12/30/20 1215 12/30/20 1417   12/30/20 1230  metroNIDAZOLE (FLAGYL) IVPB 500 mg        500 mg 100 mL/hr over 60 Minutes Intravenous  Once 12/30/20 1215 12/30/20 1624         Subjective:  Patient interviewed along with spouse at bedside.  Ongoing abdominal pain 8/10 in severity at its worst, improved to 4/10 after pain meds.  Intermittent and cramping at times.  No nausea or vomiting since hospital admission.  Very little p.o. intake in the last 10 days.  Denies history of antibiotic exposure recently.  Reports ongoing 4-5 loose/watery diarrhea without blood or mucus daily.  None since hospital admission.  Objective:   Vitals:   12/30/20 2056 12/30/20 2259 12/31/20 0259 12/31/20 0500  BP:  116/61 119/63   Pulse:  95 96   Resp:      Temp: (!) 100.7 F (38.2 C) 99.3 F (37.4 C) 98.1 F (36.7 C)   TempSrc:  Oral Oral   SpO2:  95% 97%   Weight:    98.5 kg    General exam: Middle-age male, moderately built and nourished sitting up uncomfortably in bed due to some abdominal discomfort.  Oral mucosa with borderline hydration. Respiratory system: Clear to auscultation. Respiratory effort normal. Cardiovascular system: S1 & S2 heard, RRR. No JVD, murmurs, rubs, gallops or clicks. No pedal edema. Gastrointestinal system: Nondistended, diffuse mild abdominal tenderness with voluntary guarding without rigidity, guarding or rebound.  No organomegaly or masses appreciated.  Hyperactive bowel sounds heard. Central nervous system: Alert and oriented. No focal neurological deficits. Extremities: Symmetric 5 x 5  power. Skin: No rashes, lesions or ulcers Psychiatry: Judgement and insight appear normal. Mood & affect appropriate.     Data Reviewed:   I have personally reviewed following labs and imaging studies   CBC: Recent Labs  Lab 12/27/20 1254 12/28/20 1116 12/30/20 0732 12/31/20 0505  WBC 5.7 4.9 5.2 7.4  NEUTROABS 3.4 3.3 3.6  --   HGB 14.6 14.4 14.1 13.2  HCT 40.6 41.2 41.1 39.0  MCV 77.8* 78.2* 80.1 80.6  PLT 203 236 247 599    Basic Metabolic Panel: Recent Labs  Lab 12/27/20 1254 12/28/20 1116 12/30/20 0732 12/31/20 0505  NA 133* 135 134* 134*  K 2.9* 3.4* 3.0* 3.1*  CL 99 99 98 100  CO2 22 28 26 24   GLUCOSE 138* 155* 122* 116*  BUN 16 15 11 11   CREATININE 0.81 0.74 0.97 0.80  CALCIUM 8.2* 8.8* 7.9* 7.4*  MG 1.9 2.2 1.9  --     Liver Function Tests: Recent Labs  Lab 12/28/20 1116 12/30/20 0732 12/31/20 0505  AST 16 24 23   ALT 13 23 23   ALKPHOS 39 44 41  BILITOT 0.6 1.3* 1.3*  PROT 5.6* 5.1* 4.6*  ALBUMIN 3.3* 2.5* 2.1*    CBG: No results for input(s): GLUCAP in the last 168 hours.  Microbiology Studies:   Recent Results (from the past 240 hour(s))  C Difficile Quick Screen w PCR reflex     Status: None   Collection Time: 12/25/20  9:30 AM   Specimen: STOOL  Result Value Ref Range Status   C Diff antigen NEGATIVE NEGATIVE Final   C Diff toxin NEGATIVE NEGATIVE Final   C Diff interpretation No C. difficile detected.  Final    Comment: Performed at [facility], [place] [address], [place], [place] [address]     Radiology Studies:  CT ABDOMEN PELVIS W CONTRAST  Result Date: 12/30/2020 CLINICAL DATA:  Acute abdominal pain. History of colon cancer, started chemotherapy 2 weeks ago EXAM: CT ABDOMEN AND PELVIS WITH CONTRAST TECHNIQUE: Multidetector CT imaging of the abdomen and pelvis was performed using the standard protocol following bolus administration of intravenous contrast. CONTRAST:  17mL OMNIPAQUE IOHEXOL 350 MG/ML SOLN  COMPARISON:  CT abdomen/pelvis 10/15/2020 FINDINGS: Lower chest: There is minimal bibasilar subsegmental atelectasis. The imaged heart is unremarkable. Hepatobiliary: A subcentimeter hypodense lesion in the right hepatic lobe near the dome is unchanged, too small to characterize. The liver is otherwise unremarkable. The gallbladder is unremarkable. There is no biliary ductal dilatation. Pancreas: Unremarkable. Spleen: Unremarkable. Adrenals/Urinary Tract: The adrenals are unremarkable. Subcentimeter hypodense lesions in the kidneys are unchanged, again too small to characterize but likely reflect small cysts. The kidneys are otherwise unremarkable, with no other focal lesions. There are no stones. There is no hydronephrosis or hydroureter. Stomach/Bowel: The stomach is unremarkable. There has been interval partial colectomy for colonic malignancy resection. There is no evidence of complication at the anastomotic site. There is marked wall thickening involving the distal small bowel with associated mucosal hyperemia and surrounding fat stranding consistent with infectious or inflammatory enteritis. There is no pneumatosis intestinalis. There is no evidence of bowel obstruction. Vascular/Lymphatic: There is scattered calcified atherosclerotic plaque throughout the nonaneurysmal abdominal aorta. The major branch vessels are patent. The main portal and splenic veins are patent. There is no portal venous gas. Scattered subcentimeter periportal lymph nodes measuring up to 6-7 mm are unchanged. There is no new or progressive lymphadenopathy in the abdomen or pelvis. Reproductive: The prostate and seminal vesicles are unremarkable. Other: There is small volume free fluid in the pelvis, likely reactive. There is no free intraperitoneal air. Musculoskeletal: There is no acute osseous abnormality or aggressive osseous lesion. IMPRESSION: 1. Marked wall thickening and mucosal hyperemia in the distal small bowel consistent with  nonspecific infectious/inflammatory enteritis. Differential would include medication side effect given recent initiation of chemotherapy. 2. Interval partial colectomy for colonic malignancy resection without evidence of complication at the anastomotic site. 3. Scattered subcentimeter upper abdominal lymph nodes are unchanged. No new or progressive lymphadenopathy in the abdomen or pelvis. Aortic Atherosclerosis (ICD10-I70.0). Electronically Signed   By: Valetta Mole M.D.   On: 12/30/2020 09:43     Scheduled Meds:    enoxaparin (LOVENOX) injection  40 mg Subcutaneous Q24H   magic mouthwash w/lidocaine  5 mL Oral QID   metoCLOPramide (REGLAN) injection  5 mg Intravenous Q6H   pantoprazole (PROTONIX) IV  40 mg Intravenous Q24H    Continuous Infusions:    ceFEPime (MAXIPIME) IV 2 g (12/31/20 0533)   lactated ringers 125 mL/hr at 12/30/20 2340   metronidazole 500 mg (12/31/20 0002)  LOS: 0 days     Vernell Leep, MD, [place], [facility]. [facility]    To contact the attending provider between 7A-7P or the covering provider during after hours 7P-7A, please log into the web site [email or web address] and access using universal [place] password for that web site. If you do not have the password, please call the hospital operator.  12/31/2020, 10:07 AM

## 2020-12-31 NOTE — Progress Notes (Signed)
Initial Nutrition Assessment  DOCUMENTATION CODES:   Non-severe (moderate) malnutrition in context of chronic illness  INTERVENTION:   -[employer] Instant Breakfast TID, each provides 220 kcals and 13g protein  -[employer] 1.4 PO BID, each provides 455 kcals and 20g protein  -Magic cup BID with meals, each supplement provides 290 kcal and 9 grams of protein  -Multivitamin with minerals daily  -Needs updated weight, pt states he wasn't weighed this admission  NUTRITION DIAGNOSIS:   Moderate Malnutrition related to chronic illness, cancer and cancer related treatments as evidenced by percent weight loss, energy intake < or equal to 75% for > or equal to 1 month, mild fat depletion, mild muscle depletion.  GOAL:   Patient will meet greater than or equal to 90% of their needs  MONITOR:   PO intake, Supplement acceptance, Weight trends, Labs, I & O's  REASON FOR ASSESSMENT:   Consult Assessment of nutrition requirement/status  ASSESSMENT:   60 year old married male with medical history significant for  Colon cancer, s/p laparoscopic right hemicolectomy 11/11/2020, started cycle 1 of CAPOX 12/13/2020, developed nausea and vomiting 1 day 3, then became constipated followed by persistent/intractable diarrhea and abdominal pain.  Symptoms ongoing for almost 2 weeks.  Associated with lightheadedness, fatigue, malaise, low-grade fevers, poor oral intake.  Seen in the ED and cancer center for IV hydration for the past 5 days prior to admission.  Also has PMH of hypertension, hyperlipidemia.  CT abdomen showed findings consistent with nonspecific enteritis.  Admitted for acute gastro enteritis,?  Infectious versus related to recent chemotherapy, associated dehydration, and electrolyte abnormalities.  Patient in room with wife at bedside. Pt in visible discomfort in bed. Kept eyes shut for majority of visit. States he is feeling a lot of pain. Was able to consume some pudding this morning and  had cream soup and chocolate milk for lunch.  Has had an increase in mouth sores, using Magic mouthwash now.  Pt has been unable to keep any food down and having diarrhea since chemotherapy on 10/3. This was his first cycle.  Pt agreeable to trying protein supplements. Has like [employer] in the past. Will order these as well as plant based supplement in case he is feeling sensitive to dairy. Wanted orange sherbet but kitchen was out.   Pt reports UBW of 230 lbs prior to hemicolectomy on 9/1. Weight decreased to 210 lbs prior to chemo on 10/3. This is a 8% wt loss x 1.5 months which is significant for time frame.  Medications: Magic mouthwash w/ lidocaine, Reglan, KCl, Lomotil  Labs reviewed:  Low Na, K  NUTRITION - FOCUSED PHYSICAL EXAM:  Flowsheet Row Most Recent Value  Orbital Region No depletion  Upper Arm Region Mild depletion  Thoracic and Lumbar Region Unable to assess  Buccal Region Moderate depletion  Temple Region No depletion  Clavicle Bone Region Mild depletion  Clavicle and Acromion Bone Region Mild depletion  Scapular Bone Region Mild depletion  Dorsal Hand No depletion  Patellar Region Unable to assess  Anterior Thigh Region Unable to assess  Posterior Calf Region Unable to assess  Edema (RD Assessment) Mild  Hair Reviewed  Eyes Unable to assess  Mouth Unable to assess  [mucositis]  Skin Reviewed  [dry, flaky]  Nails Reviewed       Diet Order:   Diet Order             Diet full liquid Room service appropriate? Yes; Fluid consistency: Thin  Diet effective now  EDUCATION NEEDS:   Education needs have been addressed  Skin:  Skin Assessment: Reviewed RN Assessment  Last BM:  10/21  Height:   Ht Readings from Last 1 Encounters:  12/29/20 6\' 4"  (1.93 m)    Weight:   Wt Readings from Last 1 Encounters:  12/31/20 98.5 kg    BMI:  Body mass index is 26.43 kg/m.  Estimated Nutritional Needs:   Kcal:   2500-2700  Protein:  135-145g  Fluid:  2.5L/day  Clayton Bibles, MS, RD, LDN Inpatient Clinical Dietitian Contact information available via Amion

## 2020-12-31 NOTE — Progress Notes (Addendum)
HEMATOLOGY-ONCOLOGY PROGRESS NOTE  SUBJECTIVE: Dylan Fischer is followed by Dr. Benay Spice for stage IIb colon cancer.  He started his first cycle of CAPOX on 12/13/2020.  He has had persistent abdominal pain, nausea, vomiting, diarrhea.  Xeloda was discontinued around 12/24/2020.  Now admitted with enteritis.  Had a fever up to 101.1 last evening.  Is receiving IV antibiotics.  Morning, the patient reports mucositis, abdominal pain, nausea, vomiting, and diarrhea.  Is not having any bloody stools today.  He is receiving IV Dilaudid which is more effective than the morphine he was receiving.   Oncology History  Colon cancer ([place])  11/11/2020 Initial Diagnosis   Colon cancer ([place])   11/25/2020 Cancer Staging   Staging form: Colon and Rectum, AJCC 8th Edition - Clinical: Stage IIB (cT4a, cN0, cM0) - Signed by Ladell Pier, MD on 11/25/2020 Total positive nodes: 0 Histologic grade (G): G3 Histologic grading system: 4 grade system    12/13/2020 - 12/13/2020 Chemotherapy   Patient is on Treatment Plan : COLORECTAL Xelox ([place]ox) q21d     12/27/2020 Genetic Testing   Negative genetic testing.  RECQL [id number] Variant, Unknown Significance.  The report date is December 27, 2020.  The CancerNext-Expanded gene panel offered by [facility] and includes sequencing and rearrangement analysis for the following 77 genes: AIP, ALK, APC*, ATM*, AXIN2, BAP1, BARD1, BLM, BMPR1A, BRCA1*, BRCA2*, BRIP1*, CDC73, CDH1*, CDK4, CDKN1B, CDKN2A, CHEK2*, CTNNA1, DICER1, FANCC, FH, FLCN, GALNT12, KIF1B, LZTR1, MAX, MEN1, MET, MLH1*, MSH2*, MSH3, MSH6*, MUTYH*, NBN, NF1*, NF2, NTHL1, PALB2*, PHOX2B, PMS2*, POT1, PRKAR1A, PTCH1, PTEN*, RAD51C*, RAD51D*, RB1, RECQL, RET, SDHA, SDHAF2, SDHB, SDHC, SDHD, SMAD4, SMARCA4, SMARCB1, SMARCE1, STK11, SUFU, TMEM127, TP53*, TSC1, TSC2, VHL and XRCC2 (sequencing and deletion/duplication); EGFR, EGLN1, HOXB13, KIT, MITF, PDGFRA, POLD1, and POLE (sequencing only); EPCAM and  GREM1 (deletion/duplication only). DNA and RNA analyses performed for * genes.    01/04/2021 -  Chemotherapy   Patient is on Treatment Plan : COLORECTAL FOLFOX q14d x 3 months      PHYSICAL EXAMINATION:  Vitals:   12/30/20 2259 12/31/20 0259  BP: 116/61 119/63  Pulse: 95 96  Resp:    Temp: 99.3 F (37.4 C) 98.1 F (36.7 C)  SpO2: 95% 97%   Filed Weights   12/31/20 0500  Weight: 98.5 kg    Intake/Output from previous day: 10/20 0701 - 10/21 0700 In: 231.5 [I.V.:31.3; IV Piggyback:200.3] Out: -   GENERAL:alert, no distress and comfortable OROPHARYNX: Mouth ulcers noted at the posterior pharynx LUNGS: clear to auscultation and percussion with normal breathing effort HEART: regular rate & rhythm and no murmurs and no lower extremity edema ABDOMEN: Diffusely tender with light palpation NEURO: alert & oriented x 3 with fluent speech, no focal motor/sensory deficits  LABORATORY DATA:  I have reviewed the data as listed CMP Latest Ref Rng & Units 12/31/2020 12/30/2020 12/28/2020  Glucose 70 - 99 mg/dL 116(H) 122(H) 155(H)  BUN 8 - 23 mg/dL '11 11 15  ' Creatinine 0.61 - 1.24 mg/dL 0.80 0.97 0.74  Sodium 135 - 145 mmol/L 134(L) 134(L) 135  Potassium 3.5 - 5.1 mmol/L 3.1(L) 3.0(L) 3.4(L)  Chloride 98 - 111 mmol/L 100 98 99  CO2 22 - 32 mmol/L '24 26 28  ' Calcium 8.9 - 10.3 mg/dL 7.4(L) 7.9(L) 8.8(L)  Total Protein 6.5 - 8.1 g/dL 4.6(L) 5.1(L) 5.6(L)  Total Bilirubin 0.3 - 1.2 mg/dL 1.3(H) 1.3(H) 0.6  Alkaline Phos 38 - 126 U/L 41 44 39  AST 15 - 41 U/L 23  24 16  ALT 0 - 44 U/L '23 23 13    ' Lab Results  Component Value Date   WBC 7.4 12/31/2020   HGB 13.2 12/31/2020   HCT 39.0 12/31/2020   MCV 80.6 12/31/2020   PLT 240 12/31/2020   NEUTROABS 3.6 12/30/2020    CT ABDOMEN PELVIS W CONTRAST  Result Date: 12/30/2020 CLINICAL DATA:  Acute abdominal pain. History of colon cancer, started chemotherapy 2 weeks ago EXAM: CT ABDOMEN AND PELVIS WITH CONTRAST TECHNIQUE:  Multidetector CT imaging of the abdomen and pelvis was performed using the standard protocol following bolus administration of intravenous contrast. CONTRAST:  10m OMNIPAQUE IOHEXOL 350 MG/ML SOLN COMPARISON:  CT abdomen/pelvis 10/15/2020 FINDINGS: Lower chest: There is minimal bibasilar subsegmental atelectasis. The imaged heart is unremarkable. Hepatobiliary: A subcentimeter hypodense lesion in the right hepatic lobe near the dome is unchanged, too small to characterize. The liver is otherwise unremarkable. The gallbladder is unremarkable. There is no biliary ductal dilatation. Pancreas: Unremarkable. Spleen: Unremarkable. Adrenals/Urinary Tract: The adrenals are unremarkable. Subcentimeter hypodense lesions in the kidneys are unchanged, again too small to characterize but likely reflect small cysts. The kidneys are otherwise unremarkable, with no other focal lesions. There are no stones. There is no hydronephrosis or hydroureter. Stomach/Bowel: The stomach is unremarkable. There has been interval partial colectomy for colonic malignancy resection. There is no evidence of complication at the anastomotic site. There is marked wall thickening involving the distal small bowel with associated mucosal hyperemia and surrounding fat stranding consistent with infectious or inflammatory enteritis. There is no pneumatosis intestinalis. There is no evidence of bowel obstruction. Vascular/Lymphatic: There is scattered calcified atherosclerotic plaque throughout the nonaneurysmal abdominal aorta. The major branch vessels are patent. The main portal and splenic veins are patent. There is no portal venous gas. Scattered subcentimeter periportal lymph nodes measuring up to 6-7 mm are unchanged. There is no new or progressive lymphadenopathy in the abdomen or pelvis. Reproductive: The prostate and seminal vesicles are unremarkable. Other: There is small volume free fluid in the pelvis, likely reactive. There is no free  intraperitoneal air. Musculoskeletal: There is no acute osseous abnormality or aggressive osseous lesion. IMPRESSION: 1. Marked wall thickening and mucosal hyperemia in the distal small bowel consistent with nonspecific infectious/inflammatory enteritis. Differential would include medication side effect given recent initiation of chemotherapy. 2. Interval partial colectomy for colonic malignancy resection without evidence of complication at the anastomotic site. 3. Scattered subcentimeter upper abdominal lymph nodes are unchanged. No new or progressive lymphadenopathy in the abdomen or pelvis. Aortic Atherosclerosis (ICD10-I70.0). Electronically Signed   By: PValetta MoleM.D.   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, MSI-high, loss of MLH1 and PMS2 expression, negative for BRAF V600E mutation, MLH1 hypermethylation absent  colonoscopy 10/05/2020-near completely obstructing mass at the hepatic flexure-could not be passed, polyps in the sigmoid and transverse colon CTs 10/15/2020-short segment wall thickening with adjacent inflammatory stranding at the hepatic flexure, prominent mesenteric nodes adjacent to the colonic neoplasm measuring up to 5 mm, prominent gastropathic, hepatoduodenal ligament, and right sided retroperitoneal nodes measured up to 7 mm, subcentimeter hypodense hypodensities-too small to characterize, tiny right middle lobe nodule 12/13/2020-ctDNA not detected Cycle 1 CAPOX 12/13/2020, Xeloda discontinued around 12/24/2020   Tubular adenoma on the colonoscopy 10/05/2020 and on colonoscopy 06/14/2010 Hypertension Ocular rosacea  Dylan Fischer is currently receiving chemotherapy for his colon cancer.  Now admitted with enteritis.  Etiology of his symptoms may be infectious versus due  to chemotherapy.  Stool for C. difficile was negative and GI panel was obtained this morning which is still pending.  Recommend supportive care including IV fluids, IV pain medication, liquid diet, antiemetics, and antidiarrheals pending results of GI panel.  Agree with empiric IV antibiotics for possible infection.  Will add Magic mouthwash to help with mouth pain/mucositis.  Recommendations: 1.  Follow-up on results of GI panel. 2.  Okay for Imodium and Lomotil if GI panel negative. 3.  Continue supportive care with IV fluids, antiemetics, and liquid diet.  May advance diet as tolerated. 4.  Continue IV antibiotics. 5.  Add Magic mouthwash. 6.  We will plan to change chemotherapy to FOLFOX with future chemotherapy cycles.  He is currently scheduled for follow-up next week but will likely need to delay this until symptoms have resolved.  Please call medical oncology over the weekend if there are questions.  Dr. Benay Spice return on Monday and will check on him if he remains in the hospital.  Future Appointments  Date Time Provider [place]  01/03/2021  8:30 AM [facility] 1 [facility] [place]  01/04/2021  9:30 AM [facility] PHLEBOTOMIST [facility] None  01/04/2021  9:45 AM [facility] FLUSH ROOM [facility] None  01/04/2021 10:00 AM Ladell Pier, MD [facility] None  01/04/2021 10:30 AM [facility] CHAIR 4 [facility] None  01/24/2021 10:15 AM [facility] PHLEBOTOMIST [facility] None  01/24/2021 10:45 AM Owens Shark, NP [facility] None  01/24/2021 11:15 AM [facility] CHAIR 1 [facility] None  02/14/2021 10:00 AM [facility] PHLEBOTOMIST [facility] None  02/14/2021 10:40 AM Ladell Pier, MD [facility] None  02/14/2021 11:00 AM [facility] CHAIR 1 [facility] None  08/23/2021  8:30 AM Denita Lung, MD [facility] [place]      LOS: 0 days   Mikey Bussing, DNP, AGPCNP-BC, AOCNP 12/31/20   ADDENDUM  .Patient was Personally and independently interviewed, examined and relevant elements of the history of present  illness were reviewed in details and an assessment and plan was created. All elements of the patient's history of present illness , assessment and plan were discussed in details with Mikey Bussing, DNP, AGPCNP-BC, AOCNP. The above documentation reflects our combined findings assessment and plan.  If severe uncontrolled diarrhea despite loperamide and lomotil and if infectious etiologies ruled out then could consider octreotide - but not indicated at this time. -Nutritional optimization -IVF and electrolyte mx per hospital medicine. -appreciate excellent hospital medicine cares.  Sullivan Lone MD MS

## 2021-01-01 DIAGNOSIS — K529 Noninfective gastroenteritis and colitis, unspecified: Secondary | ICD-10-CM | POA: Diagnosis not present

## 2021-01-01 DIAGNOSIS — E876 Hypokalemia: Secondary | ICD-10-CM | POA: Diagnosis not present

## 2021-01-01 DIAGNOSIS — E440 Moderate protein-calorie malnutrition: Secondary | ICD-10-CM | POA: Diagnosis not present

## 2021-01-01 DIAGNOSIS — E871 Hypo-osmolality and hyponatremia: Secondary | ICD-10-CM | POA: Diagnosis not present

## 2021-01-01 LAB — BASIC METABOLIC PANEL
Anion gap: 4 — ABNORMAL LOW (ref 5–15)
BUN: 9 mg/dL (ref 8–23)
CO2: 27 mmol/L (ref 22–32)
Calcium: 7.2 mg/dL — ABNORMAL LOW (ref 8.9–10.3)
Chloride: 101 mmol/L (ref 98–111)
Creatinine, Ser: 0.73 mg/dL (ref 0.61–1.24)
GFR, Estimated: >60 mL/min (ref >60)
Glucose, Bld: 98 mg/dL (ref 70–99)
Potassium: 3.2 mmol/L — ABNORMAL LOW (ref 3.5–5.1)
Sodium: 132 mmol/L — ABNORMAL LOW (ref 135–145)

## 2021-01-01 MED ORDER — POTASSIUM CHLORIDE IN NACL 40-0.9 MEQ/L-% IV SOLN
INTRAVENOUS | Status: DC
  Filled 2021-01-01 (×2): qty 1000

## 2021-01-01 MED ORDER — LOPERAMIDE HCL 2 MG PO CAPS
4.0000 mg | ORAL_CAPSULE | Freq: Once | ORAL | Status: AC
  Administered 2021-01-01: 4 mg via ORAL
  Filled 2021-01-01: qty 2

## 2021-01-01 MED ORDER — LOPERAMIDE HCL 2 MG PO CAPS
2.0000 mg | ORAL_CAPSULE | Freq: Three times a day (TID) | ORAL | Status: DC
  Administered 2021-01-01 – 2021-01-04 (×9): 2 mg via ORAL
  Filled 2021-01-01 (×9): qty 1

## 2021-01-01 MED ORDER — PANTOPRAZOLE SODIUM 40 MG PO TBEC
40.0000 mg | DELAYED_RELEASE_TABLET | Freq: Every day | ORAL | Status: DC
  Administered 2021-01-01 – 2021-01-05 (×5): 40 mg via ORAL
  Filled 2021-01-01 (×5): qty 1

## 2021-01-01 MED ORDER — SODIUM CHLORIDE 0.9 % IV SOLN: INTRAVENOUS | Status: DC

## 2021-01-01 MED ORDER — POTASSIUM CHLORIDE CRYS ER 20 MEQ PO TBCR
40.0000 meq | EXTENDED_RELEASE_TABLET | ORAL | Status: AC
Start: 2021-01-01 — End: 2021-01-01
  Administered 2021-01-01 (×2): 40 meq via ORAL
  Filled 2021-01-01 (×2): qty 2

## 2021-01-01 NOTE — Progress Notes (Signed)
PROGRESS NOTE   Dylan Fischer  IOE:[id number]    DOB: 09/20/1960    DOA: 12/30/2020  PCP: Pcp, No   I have briefly reviewed patients previous medical [facility] in [facility].  Chief Complaint  Patient presents with   Abdominal Pain    Brief Narrative:  60 year old married male with medical history significant for Colon cancer, s/p laparoscopic right hemicolectomy 11/11/2020, started cycle 1 of CAPOX 12/13/2020, developed nausea and vomiting 1 day 3, then became constipated followed by persistent/intractable diarrhea and abdominal pain.  Symptoms ongoing for almost 2 weeks.  Associated with lightheadedness, fatigue, malaise, low-grade fevers, poor oral intake.  Seen in the ED and cancer center for IV hydration for the past 5 days prior to admission.  Also has PMH of hypertension, hyperlipidemia.  CT abdomen showed findings consistent with nonspecific enteritis.  Admitted for acute gastro enteritis,?  Infectious versus related to recent chemotherapy, associated dehydration, and electrolyte abnormalities.  Assessment & Plan:  Principal Problem:   Enteritis Active Problems:   Essential hypertension, benign   Hyperlipidemia   Colon cancer (HCC)   Hypokalemia   Hyponatremia   Moderate protein malnutrition (HCC)   Aortic atherosclerosis (HCC)   Acute gastroenteritis   Acute gastroenteritis - Symptoms ongoing for almost 2 weeks since his first cycle of CAPOX treatment 12/13/2020 which is now on hold. - C. difficile testing 12/25/2020 negative. - GI panel PCR negative - CTA A/P with contrast 10/20: Marked wall thickening and mucosal hyperemia in the distal small bowel consistent with nonspecific infectious/inflammatory enteritis.  No evidence of complication of recent partial colectomy anastomotic site. - No significant improvement despite supportive treatment since admission.  Since metronidazole likely contributing to significant nausea and low index of suspicion for infectious  etiology, discontinue metronidazole, continue cefepime for now, advance to soft diet as tolerated since patient will have more options. - Discussed with [facility] MD on call on 10/22, suspected due to recent chemotherapy, made following recommendations: Placed on scheduled Imodium, DC Reglan and may take some time to improve.  If does not then can contact for formal consultation.    Dehydration with hyponatremia - Secondary to poor oral intake.   - Encourage oral fluids and continue IV fluids.  Hypokalemia, persistent - Likely related to GI losses and poor oral intake - Replace aggressively orally and then IV fluids.  Magnesium 2.1. - Follow BMP daily.  Essential hypertension - Controlled off of antihypertensives.  Likely related to volume depletion. - Continue to hold telmisartan and HCTZ.  Hyperlipidemia/aortic atherosclerosis - Rosuvastatin on hold, resume when able to tolerate p.o. well  Colon cancer, s/p laparoscopic right hemicolectomy on chemotherapy - Outpatient follow-up with oncology. - Alerted oncologist of patient's hospital admission.  Moderate protein calorie malnutrition - We will get dietitian consultation for input.  Body mass index is 27.02 kg/m.    DVT prophylaxis: enoxaparin (LOVENOX) injection 40 mg Start: 12/31/20 1000     Code Status: Full Code Family Communication: Discussed in detail with patient spouse at bedside 10/22, updated care and answered all questions Disposition:  Status is: Inpatient  The patient will require care spanning > 2 midnights and should be moved to inpatient because: Ongoing significant abdominal pain, poor oral intake, dehydration requiring IV fluids, enteritis on IV antibiotics.       Consultants:   None.  Procedures:   None.  Antimicrobials:    Anti-infectives (From admission, onward)    Start     Dose/Rate Route Frequency Ordered Stop  12/31/20 0100  metroNIDAZOLE (FLAGYL) IVPB 500 mg  Status:  Discontinued         500 mg 100 mL/hr over 60 Minutes Intravenous Every 12 hours 12/30/20 1233 01/01/21 1020   12/30/20 2200  ceFEPIme (MAXIPIME) 2 g in sodium chloride 0.9 % 100 mL IVPB  Status:  Discontinued       Note to Pharmacy: Please adjust dosage as needed.  Thank you.   2 g 200 mL/hr over 30 Minutes Intravenous Every 12 hours 12/30/20 1233 12/30/20 1236   12/30/20 2100  ceFEPIme (MAXIPIME) 2 g in sodium chloride 0.9 % 100 mL IVPB       Note to Pharmacy: Please adjust dosage as needed.  Thank you.   2 g 200 mL/hr over 30 Minutes Intravenous Every 8 hours 12/30/20 1237     12/30/20 1230  ceFEPIme (MAXIPIME) 2 g in sodium chloride 0.9 % 100 mL IVPB        2 g 200 mL/hr over 30 Minutes Intravenous  Once 12/30/20 1215 12/30/20 1417   12/30/20 1230  metroNIDAZOLE (FLAGYL) IVPB 500 mg        500 mg 100 mL/hr over 60 Minutes Intravenous  Once 12/30/20 1215 12/30/20 1624         Subjective:  Does not feel a whole lot different than he did yesterday.  Reports 5-6 BMs in the last 24 hours, varying from large to small volume, loose, no mucus or blood.  Abdominal pain better controlled with increased dose of Dilaudid.  Not much cramping.  Persistent nausea.  No vomiting since admission.  Decreased oral intake but wishes to try soft food so he has better choices.  Objective:   Vitals:   12/31/20 1459 12/31/20 1735 12/31/20 2048 01/01/21 0448  BP:  125/87 123/76 113/76  Pulse:  (!) 101 96 96  Resp:  16 18 16   Temp:  98.2 F (36.8 C) 98.3 F (36.8 C) 98 F (36.7 C)  TempSrc:   Oral Oral  SpO2:  98% 97% 94%  Weight: 100.7 kg       General exam: Middle-age male, moderately built and nourished sitting up comfortably in bed.  Looks improved compared to yesterday.  Oral mucosa with borderline hydration. Respiratory system: Clear to auscultation.  No increased work of breathing. Cardiovascular system: S1 & S2 heard, RRR. No JVD, murmurs, rubs, gallops or clicks. No pedal edema.  Telemetry personally  reviewed: Sinus rhythm. Gastrointestinal system: Nondistended, mild lower quadrant abdominal tenderness but no guarding, rigidity or rebound.  Normal bowel sounds heard. Central nervous system: Alert and oriented. No focal neurological deficits. Extremities: Symmetric 5 x 5 power. Skin: No rashes, lesions or ulcers Psychiatry: Judgement and insight appear normal. Mood & affect appropriate.     Data Reviewed:   I have personally reviewed following labs and imaging studies   CBC: Recent Labs  Lab 12/27/20 1254 12/28/20 1116 12/30/20 0732 12/31/20 0505  WBC 5.7 4.9 5.2 7.4  NEUTROABS 3.4 3.3 3.6  --   HGB 14.6 14.4 14.1 13.2  HCT 40.6 41.2 41.1 39.0  MCV 77.8* 78.2* 80.1 80.6  PLT 203 236 247 627    Basic Metabolic Panel: Recent Labs  Lab 12/28/20 1116 12/30/20 0732 12/31/20 0505 01/01/21 0548  NA 135 134* 134* 132*  K 3.4* 3.0* 3.1* 3.2*  CL 99 98 100 101  CO2 28 26 24 27   GLUCOSE 155* 122* 116* 98  BUN 15 11 11 9   CREATININE 0.74 0.97 0.80 0.73  CALCIUM 8.8* 7.9* 7.4* 7.2*  MG 2.2 1.9 1.8  --     Liver Function Tests: Recent Labs  Lab 12/28/20 1116 12/30/20 0732 12/31/20 0505  AST 16 24 23   ALT 13 23 23   ALKPHOS 39 44 41  BILITOT 0.6 1.3* 1.3*  PROT 5.6* 5.1* 4.6*  ALBUMIN 3.3* 2.5* 2.1*    CBG: No results for input(s): GLUCAP in the last 168 hours.  Microbiology Studies:   Recent Results (from the past 240 hour(s))  C Difficile Quick Screen w PCR reflex     Status: None   Collection Time: 12/25/20  9:30 AM   Specimen: STOOL  Result Value Ref Range Status   C Diff antigen NEGATIVE NEGATIVE Final   C Diff toxin NEGATIVE NEGATIVE Final   C Diff interpretation No C. difficile detected.  Final    Comment: Performed at [facility], [place] [address], [place], [place] [address]  Gastrointestinal Panel by PCR , Stool     Status: None   Collection Time: 12/31/20  5:23 AM   Specimen: Stool  Result Value Ref Range Status    Campylobacter species NOT DETECTED NOT DETECTED Final   Plesimonas shigelloides NOT DETECTED NOT DETECTED Final   Salmonella species NOT DETECTED NOT DETECTED Final   Yersinia enterocolitica NOT DETECTED NOT DETECTED Final   Vibrio species NOT DETECTED NOT DETECTED Final   Vibrio cholerae NOT DETECTED NOT DETECTED Final   Enteroaggregative E coli (EAEC) NOT DETECTED NOT DETECTED Final   Enteropathogenic E coli (EPEC) NOT DETECTED NOT DETECTED Final   Enterotoxigenic E coli (ETEC) NOT DETECTED NOT DETECTED Final   Shiga like toxin producing E coli (STEC) NOT DETECTED NOT DETECTED Final   Shigella/Enteroinvasive E coli (EIEC) NOT DETECTED NOT DETECTED Final   Cryptosporidium NOT DETECTED NOT DETECTED Final   Cyclospora cayetanensis NOT DETECTED NOT DETECTED Final   Entamoeba histolytica NOT DETECTED NOT DETECTED Final   Giardia lamblia NOT DETECTED NOT DETECTED Final   Adenovirus F40/41 NOT DETECTED NOT DETECTED Final   Astrovirus NOT DETECTED NOT DETECTED Final   Norovirus GI/GII NOT DETECTED NOT DETECTED Final   Rotavirus A NOT DETECTED NOT DETECTED Final   Sapovirus (I, II, IV, and V) NOT DETECTED NOT DETECTED Final    Comment: Performed at [facility], [address] [place], [place] [address]  Resp Panel by RT-PCR (Flu A&B, Covid) Nasopharyngeal Swab     Status: None   Collection Time: 12/31/20  6:20 PM   Specimen: Nasopharyngeal Swab; Nasopharyngeal(NP) swabs in vial transport medium  Result Value Ref Range Status   SARS Coronavirus 2 by RT PCR NEGATIVE NEGATIVE Final    Comment: (NOTE) SARS-CoV-2 target nucleic acids are NOT DETECTED.  The SARS-CoV-2 RNA is generally detectable in upper respiratory specimens during the acute phase of infection. The lowest concentration of SARS-CoV-2 viral copies this assay can detect is 138 copies/mL. A negative result does not preclude SARS-Cov-2 infection and should not be used as the sole basis for treatment or other patient  management decisions. A negative result may occur with  improper specimen collection/handling, submission of specimen other than nasopharyngeal swab, presence of viral mutation(s) within the areas targeted by this assay, and inadequate number of viral copies(<138 copies/mL). A negative result must be combined with clinical observations, patient history, and epidemiological information. The expected result is Negative.  Fact Sheet for Patients:  EntrepreneurPulse.com.au  Fact Sheet for Healthcare Providers:  IncredibleEmployment.be  This test is no t yet  approved or cleared by the [place] and  has been authorized for detection and/or diagnosis of SARS-CoV-2 by FDA under an Emergency Use Authorization (EUA). This EUA will remain  in effect (meaning this test can be used) for the duration of the COVID-19 declaration under Section 564(b)(1) of the Act, 21 U.S.C.section 360bbb-3(b)(1), unless the authorization is terminated  or revoked sooner.       Influenza A by PCR NEGATIVE NEGATIVE Final   Influenza B by PCR NEGATIVE NEGATIVE Final    Comment: (NOTE) The Xpert Xpress SARS-CoV-2/FLU/RSV plus assay is intended as an aid in the diagnosis of influenza from Nasopharyngeal swab specimens and should not be used as a sole basis for treatment. Nasal washings and aspirates are unacceptable for Xpert Xpress SARS-CoV-2/FLU/RSV testing.  Fact Sheet for Patients: EntrepreneurPulse.com.au  Fact Sheet for Healthcare Providers: IncredibleEmployment.be  This test is not yet approved or cleared by the [place] FDA and has been authorized for detection and/or diagnosis of SARS-CoV-2 by FDA under an Emergency Use Authorization (EUA). This EUA will remain in effect (meaning this test can be used) for the duration of the COVID-19 declaration under Section 564(b)(1) of the Act, 21 U.S.C. section 360bbb-3(b)(1),  unless the authorization is terminated or revoked.  Performed at [facility], [place] [address] [place], [place] [address]      Radiology Studies:  No results found.   Scheduled Meds:    enoxaparin (LOVENOX) injection  40 mg Subcutaneous Q24H   feeding supplement (KATE FARMS STANDARD 1.4)  325 mL Oral BID BM   loperamide  4 mg Oral Once   Followed by   loperamide  2 mg Oral TID   magic mouthwash w/lidocaine  5 mL Oral QID   multivitamin with minerals  1 tablet Oral Daily   pantoprazole  40 mg Oral Daily   potassium chloride  40 mEq Oral Q4H    Continuous Infusions:    ceFEPime (MAXIPIME) IV 2 g (01/01/21 0558)   sodium chloride 0.9 % 1,000 mL with potassium chloride 40 mEq infusion       LOS: 1 day     Vernell Leep, MD, [place], [facility]. [facility]    To contact the attending provider between 7A-7P or the covering provider during after hours 7P-7A, please log into the web site [email or web address] and access using universal [place] password for that web site. If you do not have the password, please call the hospital operator.  01/01/2021, 10:21 AM

## 2021-01-02 DIAGNOSIS — E876 Hypokalemia: Secondary | ICD-10-CM | POA: Diagnosis not present

## 2021-01-02 DIAGNOSIS — E871 Hypo-osmolality and hyponatremia: Secondary | ICD-10-CM | POA: Diagnosis not present

## 2021-01-02 DIAGNOSIS — E440 Moderate protein-calorie malnutrition: Secondary | ICD-10-CM | POA: Diagnosis not present

## 2021-01-02 DIAGNOSIS — K529 Noninfective gastroenteritis and colitis, unspecified: Secondary | ICD-10-CM | POA: Diagnosis not present

## 2021-01-02 LAB — BASIC METABOLIC PANEL
Anion gap: 5 (ref 5–15)
BUN: 7 mg/dL — ABNORMAL LOW (ref 8–23)
CO2: 27 mmol/L (ref 22–32)
Calcium: 7.1 mg/dL — ABNORMAL LOW (ref 8.9–10.3)
Chloride: 101 mmol/L (ref 98–111)
Creatinine, Ser: 0.62 mg/dL (ref 0.61–1.24)
GFR, Estimated: >60 mL/min (ref >60)
Glucose, Bld: 119 mg/dL — ABNORMAL HIGH (ref 70–99)
Potassium: 3.8 mmol/L (ref 3.5–5.1)
Sodium: 133 mmol/L — ABNORMAL LOW (ref 135–145)

## 2021-01-02 MED ORDER — ONDANSETRON HCL 4 MG PO TABS
4.0000 mg | ORAL_TABLET | Freq: Once | ORAL | Status: AC
  Administered 2021-01-02: 4 mg via ORAL
  Filled 2021-01-02 (×2): qty 1

## 2021-01-02 MED ORDER — POTASSIUM CHLORIDE IN NACL 40-0.9 MEQ/L-% IV SOLN
INTRAVENOUS | Status: AC
  Filled 2021-01-02 (×2): qty 1000

## 2021-01-02 MED ORDER — SODIUM CHLORIDE 0.9 % IV SOLN
12.5000 mg | Freq: Three times a day (TID) | INTRAVENOUS | Status: DC | PRN
  Administered 2021-01-02 – 2021-01-03 (×4): 12.5 mg via INTRAVENOUS
  Filled 2021-01-02 (×4): qty 12.5

## 2021-01-02 MED ORDER — ONDANSETRON HCL 4 MG/2ML IJ SOLN
4.0000 mg | Freq: Three times a day (TID) | INTRAMUSCULAR | Status: DC
  Administered 2021-01-02 – 2021-01-04 (×5): 4 mg via INTRAVENOUS
  Filled 2021-01-02 (×7): qty 2

## 2021-01-02 NOTE — Progress Notes (Signed)
Patient had 1 loose stool today from 0700-1745. Pt still complains of nausea w/ no vomiting.

## 2021-01-02 NOTE — Progress Notes (Signed)
PROGRESS NOTE   Dylan Fischer  WCH:[id number]    DOB: Sep 05, 1960    DOA: 12/30/2020  PCP: Pcp, No   I have briefly reviewed patients previous medical records in [facility].  Chief Complaint  Patient presents with   Abdominal Pain    Brief Narrative:  60 year old married male with medical history significant for Colon cancer, s/p laparoscopic right hemicolectomy 11/11/2020, started cycle 1 of CAPOX 12/13/2020, developed nausea and vomiting 1 day 3, then became constipated followed by persistent/intractable diarrhea and abdominal pain.  Symptoms ongoing for almost 2 weeks.  Associated with lightheadedness, fatigue, malaise, low-grade fevers, poor oral intake.  Seen in the ED and cancer center for IV hydration for the past 5 days prior to admission.  Also has PMH of hypertension, hyperlipidemia.  CT abdomen showed findings consistent with nonspecific enteritis.  Admitted for acute gastro enteritis,?  Infectious versus related to recent chemotherapy, associated dehydration, and electrolyte abnormalities.  Assessment & Plan:  Principal Problem:   Enteritis Active Problems:   Essential hypertension, benign   Hyperlipidemia   Colon cancer (HCC)   Hypokalemia   Hyponatremia   Moderate protein malnutrition (HCC)   Aortic atherosclerosis (HCC)   Acute gastroenteritis   Acute gastroenteritis - Symptoms ongoing for almost 2 weeks since his first cycle of CAPOX treatment 12/13/2020 which is now on hold. - C. difficile testing 12/25/2020 negative. - GI panel PCR negative - CTA A/P with contrast 10/20: Marked wall thickening and mucosal hyperemia in the distal small bowel consistent with nonspecific infectious/inflammatory enteritis.  No evidence of complication of recent partial colectomy anastomotic site. - Has been slow to improve.  Discontinued metronidazole yesterday especially since low concerns for infectious etiology, GI panel PCR negative and likely causing nausea.  Discontinued  cefepime today as well. - Discussed with [facility] MD on call on 10/22, suspected due to recent chemotherapy, made following recommendations: Placed on scheduled Imodium, DC Reglan and may take some time to improve.  If does not then can contact for formal consultation. - Somewhat better today.  Not really getting his choices of soft food and wife plans to bring from outside.  Added scheduled IV Zofran.  Dehydration with hyponatremia - Secondary to poor oral intake.   - Encourage oral fluids and reduced IV fluids due to third spacing/some hand and ankle edema.  Hypokalemia, persistent - Likely related to GI losses and poor oral intake - Replaced.  Magnesium 2.1. - Follow BMP daily.  Essential hypertension - Controlled off of antihypertensives.  Likely related to volume depletion. - Continue to hold telmisartan and HCTZ.  Hyperlipidemia/aortic atherosclerosis - Rosuvastatin on hold, resume when able to tolerate p.o. well  Colon cancer, s/p laparoscopic right hemicolectomy on chemotherapy - Outpatient follow-up with oncology. - Alerted oncologist of patient's hospital admission.  Moderate protein calorie malnutrition - We will get dietitian consultation for input.  Body mass index is 27.02 kg/m.    DVT prophylaxis: enoxaparin (LOVENOX) injection 40 mg Start: 12/31/20 1000     Code Status: Full Code Family Communication: Discussed in detail with patient spouse at bedside 10/23, updated care and answered all questions Disposition:  Status is: Inpatient  The patient will require care spanning > 2 midnights and should be moved to inpatient because: Ongoing significant abdominal pain, poor oral intake, dehydration requiring IV fluids, enteritis on IV antibiotics.       Consultants:   None.  Procedures:   None.  Antimicrobials:    Anti-infectives (From admission, onward)  Start     Dose/Rate Route Frequency Ordered Stop   12/31/20 0100  metroNIDAZOLE (FLAGYL) IVPB  500 mg  Status:  Discontinued        500 mg 100 mL/hr over 60 Minutes Intravenous Every 12 hours 12/30/20 1233 01/01/21 1020   12/30/20 2200  ceFEPIme (MAXIPIME) 2 g in sodium chloride 0.9 % 100 mL IVPB  Status:  Discontinued       Note to Pharmacy: Please adjust dosage as needed.  Thank you.   2 g 200 mL/hr over 30 Minutes Intravenous Every 12 hours 12/30/20 1233 12/30/20 1236   12/30/20 2100  ceFEPIme (MAXIPIME) 2 g in sodium chloride 0.9 % 100 mL IVPB  Status:  Discontinued       Note to Pharmacy: Please adjust dosage as needed.  Thank you.   2 g 200 mL/hr over 30 Minutes Intravenous Every 8 hours 12/30/20 1237 01/02/21 0945   12/30/20 1230  ceFEPIme (MAXIPIME) 2 g in sodium chloride 0.9 % 100 mL IVPB        2 g 200 mL/hr over 30 Minutes Intravenous  Once 12/30/20 1215 12/30/20 1417   12/30/20 1230  metroNIDAZOLE (FLAGYL) IVPB 500 mg        500 mg 100 mL/hr over 60 Minutes Intravenous  Once 12/30/20 1215 12/30/20 1624         Subjective:  May be somewhat better.  Less nauseous.  Wants to try soft food but limited choices at hospital cafeteria.  Wife planning to bring outside food and I am okay with that.  Abdominal pain controlled on IV meds.  Diarrhea low-volume, less frequency, 3 times yesterday.  Also had 3 since this a.m. though.  Wants to take a shower.  Objective:   Vitals:   12/31/20 2048 01/01/21 0448 01/01/21 1410 01/02/21 0542  BP: 123/76 113/76 104/65 119/77  Pulse: 96 96 83 81  Resp: 18 16 18 18   Temp: 98.3 F (36.8 C) 98 F (36.7 C) 98.2 F (36.8 C) (!) 97.5 F (36.4 C)  TempSrc: Oral Oral Oral Oral  SpO2: 97% 94% 98% 97%  Weight:        General exam: Middle-age male, moderately built and nourished sitting up comfortably in bed.  Looks improved compared to yesterday.  Oral mucosa moist Respiratory system: Clear to auscultation.  No increased work of breathing. Cardiovascular system: S1 & S2 heard, RRR. No JVD, murmurs, rubs, gallops or clicks.  Trace  bilateral upper extremity and ankle edema.  Telemetry personally reviewed: Sinus rhythm. Gastrointestinal system: Nondistended, mild lower quadrant abdominal tenderness but no guarding, rigidity or rebound.  Normal bowel sounds heard. Central nervous system: Alert and oriented. No focal neurological deficits. Extremities: Symmetric 5 x 5 power. Skin: No rashes, lesions or ulcers Psychiatry: Judgement and insight appear normal. Mood & affect appropriate.     Data Reviewed:   I have personally reviewed following labs and imaging studies   CBC: Recent Labs  Lab 12/27/20 1254 12/28/20 1116 12/30/20 0732 12/31/20 0505  WBC 5.7 4.9 5.2 7.4  NEUTROABS 3.4 3.3 3.6  --   HGB 14.6 14.4 14.1 13.2  HCT 40.6 41.2 41.1 39.0  MCV 77.8* 78.2* 80.1 80.6  PLT 203 236 247 706    Basic Metabolic Panel: Recent Labs  Lab 12/28/20 1116 12/28/20 1116 12/30/20 0732 12/31/20 0505 01/01/21 0548 01/02/21 0527  NA 135  --  134* 134* 132* 133*  K 3.4*  --  3.0* 3.1* 3.2* 3.8  CL 99  --  98 100 101 101  CO2 28  --  26 24 27 27   GLUCOSE 155*  --  122* 116* 98 119*  BUN 15  --  11 11 9  7*  CREATININE 0.74   < > 0.97 0.80 0.73 0.62  CALCIUM 8.8*  --  7.9* 7.4* 7.2* 7.1*  MG 2.2  --  1.9 1.8  --   --    < > = values in this interval not displayed.    Liver Function Tests: Recent Labs  Lab 12/28/20 1116 12/30/20 0732 12/31/20 0505  AST 16 24 23   ALT 13 23 23   ALKPHOS 39 44 41  BILITOT 0.6 1.3* 1.3*  PROT 5.6* 5.1* 4.6*  ALBUMIN 3.3* 2.5* 2.1*    CBG: No results for input(s): GLUCAP in the last 168 hours.  Microbiology Studies:   Recent Results (from the past 240 hour(s))  C Difficile Quick Screen w PCR reflex     Status: None   Collection Time: 12/25/20  9:30 AM   Specimen: STOOL  Result Value Ref Range Status   C Diff antigen NEGATIVE NEGATIVE Final   C Diff toxin NEGATIVE NEGATIVE Final   C Diff interpretation No C. difficile detected.  Final    Comment: Performed at [facility], [place] [address], [place], [place] [address]  Gastrointestinal Panel by PCR , Stool     Status: None   Collection Time: 12/31/20  5:23 AM   Specimen: Stool  Result Value Ref Range Status   Campylobacter species NOT DETECTED NOT DETECTED Final   Plesimonas shigelloides NOT DETECTED NOT DETECTED Final   Salmonella species NOT DETECTED NOT DETECTED Final   Yersinia enterocolitica NOT DETECTED NOT DETECTED Final   Vibrio species NOT DETECTED NOT DETECTED Final   Vibrio cholerae NOT DETECTED NOT DETECTED Final   Enteroaggregative E coli (EAEC) NOT DETECTED NOT DETECTED Final   Enteropathogenic E coli (EPEC) NOT DETECTED NOT DETECTED Final   Enterotoxigenic E coli (ETEC) NOT DETECTED NOT DETECTED Final   Shiga like toxin producing E coli (STEC) NOT DETECTED NOT DETECTED Final   Shigella/Enteroinvasive E coli (EIEC) NOT DETECTED NOT DETECTED Final   Cryptosporidium NOT DETECTED NOT DETECTED Final   Cyclospora cayetanensis NOT DETECTED NOT DETECTED Final   Entamoeba histolytica NOT DETECTED NOT DETECTED Final   Giardia lamblia NOT DETECTED NOT DETECTED Final   Adenovirus F40/41 NOT DETECTED NOT DETECTED Final   Astrovirus NOT DETECTED NOT DETECTED Final   Norovirus GI/GII NOT DETECTED NOT DETECTED Final   Rotavirus A NOT DETECTED NOT DETECTED Final   Sapovirus (I, II, IV, and V) NOT DETECTED NOT DETECTED Final    Comment: Performed at [facility], [address] [place], [place] [address]  Resp Panel by RT-PCR (Flu A&B, Covid) Nasopharyngeal Swab     Status: None   Collection Time: 12/31/20  6:20 PM   Specimen: Nasopharyngeal Swab; Nasopharyngeal(NP) swabs in vial transport medium  Result Value Ref Range Status   SARS Coronavirus 2 by RT PCR NEGATIVE NEGATIVE Final    Comment: (NOTE) SARS-CoV-2 target nucleic acids are NOT DETECTED.  The SARS-CoV-2 RNA is generally detectable in upper respiratory specimens during the acute phase of infection. The  lowest concentration of SARS-CoV-2 viral copies this assay can detect is 138 copies/mL. A negative result does not preclude SARS-Cov-2 infection and should not be used as the sole basis for treatment or other patient management decisions. A negative result may occur with  improper specimen  collection/handling, submission of specimen other than nasopharyngeal swab, presence of viral mutation(s) within the areas targeted by this assay, and inadequate number of viral copies(<138 copies/mL). A negative result must be combined with clinical observations, patient history, and epidemiological information. The expected result is Negative.  Fact Sheet for Patients:  EntrepreneurPulse.com.au  Fact Sheet for Healthcare Providers:  IncredibleEmployment.be  This test is no t yet approved or cleared by the [place] FDA and  has been authorized for detection and/or diagnosis of SARS-CoV-2 by FDA under an Emergency Use Authorization (EUA). This EUA will remain  in effect (meaning this test can be used) for the duration of the COVID-19 declaration under Section 564(b)(1) of the Act, 21 U.S.C.section 360bbb-3(b)(1), unless the authorization is terminated  or revoked sooner.       Influenza A by PCR NEGATIVE NEGATIVE Final   Influenza B by PCR NEGATIVE NEGATIVE Final    Comment: (NOTE) The Xpert Xpress SARS-CoV-2/FLU/RSV plus assay is intended as an aid in the diagnosis of influenza from Nasopharyngeal swab specimens and should not be used as a sole basis for treatment. Nasal washings and aspirates are unacceptable for Xpert Xpress SARS-CoV-2/FLU/RSV testing.  Fact Sheet for Patients: EntrepreneurPulse.com.au  Fact Sheet for Healthcare Providers: IncredibleEmployment.be  This test is not yet approved or cleared by the [place] FDA and has been authorized for detection and/or diagnosis of SARS-CoV-2 by FDA under  an Emergency Use Authorization (EUA). This EUA will remain in effect (meaning this test can be used) for the duration of the COVID-19 declaration under Section 564(b)(1) of the Act, 21 U.S.C. section 360bbb-3(b)(1), unless the authorization is terminated or revoked.  Performed at [facility], [place] [address], [place], [place] [address]      Radiology Studies:  No results found.   Scheduled Meds:    enoxaparin (LOVENOX) injection  40 mg Subcutaneous Q24H   feeding supplement (KATE FARMS STANDARD 1.4)  325 mL Oral BID BM   loperamide  2 mg Oral TID   magic mouthwash w/lidocaine  5 mL Oral QID   multivitamin with minerals  1 tablet Oral Daily   ondansetron (ZOFRAN) IV  4 mg Intravenous Q8H   ondansetron  4 mg Oral Once   pantoprazole  40 mg Oral Daily    Continuous Infusions:    0.9 % NaCl with KCl 40 mEq / L 50 mL/hr at 01/02/21 1327   promethazine (PHENERGAN) injection (IM or IVPB) 200 mL/hr at 01/02/21 1327     LOS: 2 days     Vernell Leep, MD, [place], [facility]. [facility]    To contact the attending provider between 7A-7P or the covering provider during after hours 7P-7A, please log into the web site [email or web address] and access using universal [place] password for that web site. If you do not have the password, please call the hospital operator.  01/02/2021, 3:09 PM

## 2021-01-03 ENCOUNTER — Ambulatory Visit: Payer: Self-pay | Admitting: Genetic Counselor

## 2021-01-03 ENCOUNTER — Telehealth: Payer: Self-pay | Admitting: Genetic Counselor

## 2021-01-03 ENCOUNTER — Other Ambulatory Visit (HOSPITAL_COMMUNITY): Payer: 59

## 2021-01-03 ENCOUNTER — Encounter: Payer: Self-pay | Admitting: Oncology

## 2021-01-03 DIAGNOSIS — Z1379 Encounter for other screening for genetic and chromosomal anomalies: Secondary | ICD-10-CM

## 2021-01-03 LAB — COMPREHENSIVE METABOLIC PANEL
ALT: 17 U/L (ref 0–44)
AST: 20 U/L (ref 15–41)
Albumin: 1.8 g/dL — ABNORMAL LOW (ref 3.5–5.0)
Alkaline Phosphatase: 46 U/L (ref 38–126)
Anion gap: 4 — ABNORMAL LOW (ref 5–15)
BUN: 6 mg/dL — ABNORMAL LOW (ref 8–23)
CO2: 27 mmol/L (ref 22–32)
Calcium: 7 mg/dL — ABNORMAL LOW (ref 8.9–10.3)
Chloride: 102 mmol/L (ref 98–111)
Creatinine, Ser: 0.87 mg/dL (ref 0.61–1.24)
GFR, Estimated: >60 mL/min (ref >60)
Glucose, Bld: 102 mg/dL — ABNORMAL HIGH (ref 70–99)
Potassium: 3.6 mmol/L (ref 3.5–5.1)
Sodium: 133 mmol/L — ABNORMAL LOW (ref 135–145)
Total Bilirubin: 0.6 mg/dL (ref 0.3–1.2)
Total Protein: 4 g/dL — ABNORMAL LOW (ref 6.5–8.1)

## 2021-01-03 LAB — CBC
HCT: 35.9 % — ABNORMAL LOW (ref 39.0–52.0)
Hemoglobin: 11.6 g/dL — ABNORMAL LOW (ref 13.0–17.0)
MCH: 27.2 pg (ref 26.0–34.0)
MCHC: 32.3 g/dL (ref 30.0–36.0)
MCV: 84.1 fL (ref 80.0–100.0)
Platelets: 278 10*3/uL (ref 150–400)
RBC: 4.27 MIL/uL (ref 4.22–5.81)
RDW: 16.1 % — ABNORMAL HIGH (ref 11.5–15.5)
WBC: 5.8 10*3/uL (ref 4.0–10.5)
nRBC: 0 % (ref 0.0–0.2)

## 2021-01-03 MED ORDER — POTASSIUM CHLORIDE IN NACL 40-0.9 MEQ/L-% IV SOLN
INTRAVENOUS | Status: DC
  Filled 2021-01-03 (×2): qty 1000

## 2021-01-03 MED ORDER — HYDROMORPHONE HCL 2 MG PO TABS
2.0000 mg | ORAL_TABLET | ORAL | Status: DC | PRN
Start: 2021-01-03 — End: 2021-01-04
  Administered 2021-01-03 (×2): 2 mg via ORAL
  Filled 2021-01-03 (×3): qty 1

## 2021-01-03 MED ORDER — OXYCODONE HCL 5 MG PO TABS
5.0000 mg | ORAL_TABLET | ORAL | Status: DC | PRN
Start: 2021-01-03 — End: 2021-01-04
  Administered 2021-01-04: 5 mg via ORAL
  Filled 2021-01-03 (×2): qty 1

## 2021-01-03 MED ORDER — ONDANSETRON HCL 4 MG PO TABS
4.0000 mg | ORAL_TABLET | Freq: Once | ORAL | Status: AC
  Administered 2021-01-03: 4 mg via ORAL
  Filled 2021-01-03: qty 1

## 2021-01-03 NOTE — Telephone Encounter (Signed)
Revealed negative genetic testing.  Discussed that we do not know why he has colon cancer or why there is cancer in the family. It could be due to a different gene that we are not testing, or maybe our current technology may not be able to pick something up.  It will be important for him to keep in contact with genetics to keep up with whether additional testing may be needed.  On VUS in RECQL but this will not change medical management.

## 2021-01-03 NOTE — Progress Notes (Addendum)
HPI:  [name] was previously seen in the [place] clinic due to a personal and family history of cancer and concerns regarding a hereditary predisposition to cancer. Please refer to our prior cancer genetics clinic note for more information regarding our discussion, assessment and recommendations, at the time. [name] recent genetic test results were disclosed to him, as were recommendations warranted by these results. These results and recommendations are discussed in more detail below.  CANCER HISTORY:  Oncology History  Colon cancer ([place])  11/11/2020 Initial Diagnosis   Colon cancer ([place])   11/25/2020 Cancer Staging   Staging form: Colon and Rectum, AJCC 8th Edition - Clinical: Stage IIB (cT4a, cN0, cM0) - Signed by Ladell Pier, MD on 11/25/2020 Total positive nodes: 0 Histologic grade (G): G3 Histologic grading system: 4 grade system    12/13/2020 - 12/13/2020 Chemotherapy   Patient is on Treatment Plan : COLORECTAL Xelox ([place]ox) q21d     12/27/2020 Genetic Testing   Negative genetic testing.  RECQL [id number] Variant, Unknown Significance.  The report date is December 27, 2020.  The CancerNext-Expanded gene panel offered by [facility] and includes sequencing and rearrangement analysis for the following 77 genes: AIP, ALK, APC*, ATM*, AXIN2, BAP1, BARD1, BLM, BMPR1A, BRCA1*, BRCA2*, BRIP1*, CDC73, CDH1*, CDK4, CDKN1B, CDKN2A, CHEK2*, CTNNA1, DICER1, FANCC, FH, FLCN, GALNT12, KIF1B, LZTR1, MAX, MEN1, MET, MLH1*, MSH2*, MSH3, MSH6*, MUTYH*, NBN, NF1*, NF2, NTHL1, PALB2*, PHOX2B, PMS2*, POT1, PRKAR1A, PTCH1, PTEN*, RAD51C*, RAD51D*, RB1, RECQL, RET, SDHA, SDHAF2, SDHB, SDHC, SDHD, SMAD4, SMARCA4, SMARCB1, SMARCE1, STK11, SUFU, TMEM127, TP53*, TSC1, TSC2, VHL and XRCC2 (sequencing and deletion/duplication); EGFR, EGLN1, HOXB13, KIT, MITF, PDGFRA, POLD1, and POLE (sequencing only); EPCAM and GREM1 (deletion/duplication only). DNA and RNA analyses performed  for * genes.    01/04/2021 -  Chemotherapy   Patient is on Treatment Plan : COLORECTAL FOLFOX q14d x 3 months       FAMILY HISTORY:  We obtained a detailed, 4-generation family history.  Significant diagnoses are listed below: Family History  Problem Relation Age of Onset   Arthritis Mother    Hyperlipidemia Mother    Heart disease Mother        48   Breast cancer Mother 46   Hypertension Father    COPD Father    Hyperlipidemia Father    Stroke Father    Aneurysm Father 58   Prostate cancer Father 79   Colon cancer Neg Hx    Esophageal cancer Neg Hx    Rectal cancer Neg Hx    Stomach cancer Neg Hx       The patient has two daughters who are cancer free.  He has one full brother and four maternal half brothers.  He is somewhat estranged from his family and does not have any information on the health of his brothers.  Both parents are deceased.   The patient's mother had breast cancer in her 22's and died at 67. She had a brother and two sisters who the patient thinks were cancer free.  His maternal grandparents died, possibly of heart diease.   The patient's father had prostate cancer at 58 and died of a stroke.  He had three brothers who were cancer free.  The paternal grandparents died of non-cancer related causes.   [name] is unaware of previous family history of genetic testing for hereditary cancer risks. Patient's maternal ancestors are of [place] descent, and paternal ancestors are of [place] descent. There is no reported Ashkenazi  Jewish ancestry. There is no known consanguinity.  GENETIC TEST RESULTS: Genetic testing reported out on December 27, 2020 through the CancerNext-Expanded+RNAinsight cancer panel found no pathogenic mutations. The CancerNext-Expanded gene panel offered by [facility] and includes sequencing and rearrangement analysis for the following 77 genes: AIP, ALK, APC*, ATM*, AXIN2, BAP1, BARD1, BLM, BMPR1A, BRCA1*, BRCA2*, BRIP1*, CDC73, CDH1*,  CDK4, CDKN1B, CDKN2A, CHEK2*, CTNNA1, DICER1, FANCC, FH, FLCN, GALNT12, KIF1B, LZTR1, MAX, MEN1, MET, MLH1*, MSH2*, MSH3, MSH6*, MUTYH*, NBN, NF1*, NF2, NTHL1, PALB2*, PHOX2B, PMS2*, POT1, PRKAR1A, PTCH1, PTEN*, RAD51C*, RAD51D*, RB1, RECQL, RET, SDHA, SDHAF2, SDHB, SDHC, SDHD, SMAD4, SMARCA4, SMARCB1, SMARCE1, STK11, SUFU, TMEM127, TP53*, TSC1, TSC2, VHL and XRCC2 (sequencing and deletion/duplication); EGFR, EGLN1, HOXB13, KIT, MITF, PDGFRA, POLD1, and POLE (sequencing only); EPCAM and GREM1 (deletion/duplication only). DNA and RNA analyses performed for * genes. The test report has been scanned into EPIC and is located under the Molecular Pathology section of the Results Review tab.  A portion of the result report is included below for reference.     We discussed with [name] that because current genetic testing is not perfect, it is possible there may be a gene mutation in one of these genes that current testing cannot detect, but that chance is small.  We also discussed, that there could be another gene that has not yet been discovered, or that we have not yet tested, that is responsible for the cancer diagnoses in the family. It is also possible there is a hereditary cause for the cancer in the family that [name] did not inherit and therefore was not identified in his testing.  Therefore, it is important to remain in touch with cancer genetics in the future so that we can continue to offer [name] the most up to date genetic testing.   Genetic testing did identify a variant of uncertain significance (VUS) was identified in the [facility] gene called [id number].  At this time, it is unknown if this variant is associated with increased cancer risk or if this is a normal finding, but most variants such as this get reclassified to being inconsequential. It should not be used to make medical management decisions. With time, we suspect the lab will determine the significance of this variant, if  any. If we do learn more about it, we will try to contact Ms. Dylan Fischer to discuss it further. However, it is important to stay in touch with [place] periodically and keep the address and phone number up to date.  ADDITIONAL GENETIC TESTING: We discussed with [name] that his genetic testing was fairly extensive.  If there are genes identified to increase cancer risk that can be analyzed in the future, we would be happy to discuss and coordinate this testing at that time.    CANCER SCREENING RECOMMENDATIONS: [name] test result is considered negative (normal).  This means that we have not identified a hereditary cause for his personal and family history of cancer at this time. Most cancers happen by chance and this negative test suggests that his cancer may fall into this category.    While reassuring, this does not definitively rule out a hereditary predisposition to cancer. It is still possible that there could be genetic mutations that are undetectable by current technology. There could be genetic mutations in genes that have not been tested or identified to increase cancer risk.  Therefore, it is recommended he continue to follow the cancer management and screening guidelines provided by his oncology and primary  healthcare provider.   An individual's cancer risk and medical management are not determined by genetic test results alone. Overall cancer risk assessment incorporates additional factors, including personal medical history, family history, and any available genetic information that may result in a personalized plan for cancer prevention and surveillance  RECOMMENDATIONS FOR FAMILY MEMBERS:  Individuals in this family might be at some increased risk of developing cancer, over the general population risk, simply due to the family history of cancer.  We recommended women in this family have a yearly mammogram beginning at age 30, or 39 years younger than the earliest onset of cancer, an annual  clinical breast exam, and perform monthly breast self-exams. Women in this family should also have a gynecological exam as recommended by their primary provider. All family members should be referred for colonoscopy starting at age 61.  FOLLOW-UP: Lastly, we discussed with [name] that cancer genetics is a rapidly advancing field and it is possible that new genetic tests will be appropriate for him and/or his family members in the future. We encouraged him to remain in contact with cancer genetics on an annual basis so we can update his personal and family histories and let him know of advances in cancer genetics that may benefit this family.   Our contact number was provided. [name] questions were answered to his satisfaction, and he knows he is welcome to call us at anytime with additional questions or concerns.   Roma Kayser, [place], [facility] Licensed, Certified Genetic Counselor [email or web address]

## 2021-01-03 NOTE — Telephone Encounter (Signed)
Revealed negative genetic testing.  Discussed that we do not know why he has colon cancer or why there is cancer in the family. It could be due to a different gene that we are not testing, or maybe our current technology may not be able to pick something up.  It will be important for him to keep in contact with genetics to keep up with whether additional testing may be needed.  One VUS was found in RECQL, but this will not change medical management.

## 2021-01-03 NOTE — Progress Notes (Addendum)
HEMATOLOGY-ONCOLOGY PROGRESS NOTE  SUBJECTIVE: Dylan Fischer feels a little better this morning.  He denies mucositis.  He still has nausea which is fairly well controlled with IV Phenergan.  He continues to have loose stools and had 3 in the past 24 hours.  No recurrent fevers.  Oncology History  Colon cancer ([employer])  11/11/2020 Initial Diagnosis   Colon cancer ([place])   11/25/2020 Cancer Staging   Staging form: Colon and Rectum, AJCC 8th Edition - Clinical: Stage IIB (cT4a, cN0, cM0) - Signed by Ladell Pier, MD on 11/25/2020 Total positive nodes: 0 Histologic grade (G): G3 Histologic grading system: 4 grade system    12/13/2020 - 12/13/2020 Chemotherapy   Patient is on Treatment Plan : COLORECTAL Xelox ([place]ox) q21d     12/27/2020 Genetic Testing   Negative genetic testing.  RECQL [id number] Variant, Unknown Significance.  The report date is December 27, 2020.  The CancerNext-Expanded gene panel offered by [facility] and includes sequencing and rearrangement analysis for the following 77 genes: AIP, ALK, APC*, ATM*, AXIN2, BAP1, BARD1, BLM, BMPR1A, BRCA1*, BRCA2*, BRIP1*, CDC73, CDH1*, CDK4, CDKN1B, CDKN2A, CHEK2*, CTNNA1, DICER1, FANCC, FH, FLCN, GALNT12, KIF1B, LZTR1, MAX, MEN1, MET, MLH1*, MSH2*, MSH3, MSH6*, MUTYH*, NBN, NF1*, NF2, NTHL1, PALB2*, PHOX2B, PMS2*, POT1, PRKAR1A, PTCH1, PTEN*, RAD51C*, RAD51D*, RB1, RECQL, RET, SDHA, SDHAF2, SDHB, SDHC, SDHD, SMAD4, SMARCA4, SMARCB1, SMARCE1, STK11, SUFU, TMEM127, TP53*, TSC1, TSC2, VHL and XRCC2 (sequencing and deletion/duplication); EGFR, EGLN1, HOXB13, KIT, MITF, PDGFRA, POLD1, and POLE (sequencing only); EPCAM and GREM1 (deletion/duplication only). DNA and RNA analyses performed for * genes.    01/04/2021 -  Chemotherapy   Patient is on Treatment Plan : COLORECTAL FOLFOX q14d x 3 months      PHYSICAL EXAMINATION:  Vitals:   01/02/21 2116 01/03/21 0343  BP: 130/81 115/67  Pulse: 96 92  Resp: 20 20  Temp: 98.4 F (36.9  C) 98.4 F (36.9 C)  SpO2: 94% 97%   Filed Weights   12/31/20 0500 12/31/20 1459  Weight: 98.5 kg 100.7 kg    Intake/Output from previous day: 10/23 0701 - 10/24 0700 In: 332.2 [I.V.:270.8; IV Piggyback:61.3] Out: -   GENERAL:alert, no distress and comfortable OROPHARYNX: Mucositis resolved LUNGS: clear to auscultation and percussion with normal breathing effort HEART: regular rate & rhythm and no murmurs and no lower extremity edema ABDOMEN: Positive bowel sounds, soft, mild tenderness with palpation NEURO: alert & oriented x 3 with fluent speech, no focal motor/sensory deficits  LABORATORY DATA:  I have reviewed the data as listed CMP Latest Ref Rng & Units 01/03/2021 01/02/2021 01/01/2021  Glucose 70 - 99 mg/dL 102(H) 119(H) 98  BUN 8 - 23 mg/dL 6(L) 7(L) 9  Creatinine 0.61 - 1.24 mg/dL 0.87 0.62 0.73  Sodium 135 - 145 mmol/L 133(L) 133(L) 132(L)  Potassium 3.5 - 5.1 mmol/L 3.6 3.8 3.2(L)  Chloride 98 - 111 mmol/L 102 101 101  CO2 22 - 32 mmol/L _0 Calcium 8.9 - 10.3 mg/dL 7.0(L) 7.1(L) 7.2(L)  Total Protein 6.5 - 8.1 g/dL 4.0(L) - -  Total Bilirubin 0.3 - 1.2 mg/dL 0.6 - -  Alkaline Phos 38 - 126 U/L 46 - -  AST 15 - 41 U/L 20 - -  ALT 0 - 44 U/L 17 - -    Lab Results  Component Value Date   WBC 5.8 01/03/2021   HGB 11.6 (L) 01/03/2021   HCT 35.9 (L) 01/03/2021   MCV 84.1 01/03/2021   PLT 278 01/03/2021  NEUTROABS 3.6 12/30/2020    CT ABDOMEN PELVIS W CONTRAST  Result Date: 12/30/2020 CLINICAL DATA:  Acute abdominal pain. History of colon cancer, started chemotherapy 2 weeks ago EXAM: CT ABDOMEN AND PELVIS WITH CONTRAST TECHNIQUE: Multidetector CT imaging of the abdomen and pelvis was performed using the standard protocol following bolus administration of intravenous contrast. CONTRAST:  74m OMNIPAQUE IOHEXOL 350 MG/ML SOLN COMPARISON:  CT abdomen/pelvis 10/15/2020 FINDINGS: Lower chest: There is minimal bibasilar subsegmental atelectasis. The imaged  heart is unremarkable. Hepatobiliary: A subcentimeter hypodense lesion in the right hepatic lobe near the dome is unchanged, too small to characterize. The liver is otherwise unremarkable. The gallbladder is unremarkable. There is no biliary ductal dilatation. Pancreas: Unremarkable. Spleen: Unremarkable. Adrenals/Urinary Tract: The adrenals are unremarkable. Subcentimeter hypodense lesions in the kidneys are unchanged, again too small to characterize but likely reflect small cysts. The kidneys are otherwise unremarkable, with no other focal lesions. There are no stones. There is no hydronephrosis or hydroureter. Stomach/Bowel: The stomach is unremarkable. There has been interval partial colectomy for colonic malignancy resection. There is no evidence of complication at the anastomotic site. There is marked wall thickening involving the distal small bowel with associated mucosal hyperemia and surrounding fat stranding consistent with infectious or inflammatory enteritis. There is no pneumatosis intestinalis. There is no evidence of bowel obstruction. Vascular/Lymphatic: There is scattered calcified atherosclerotic plaque throughout the nonaneurysmal abdominal aorta. The major branch vessels are patent. The main portal and splenic veins are patent. There is no portal venous gas. Scattered subcentimeter periportal lymph nodes measuring up to 6-7 mm are unchanged. There is no new or progressive lymphadenopathy in the abdomen or pelvis. Reproductive: The prostate and seminal vesicles are unremarkable. Other: There is small volume free fluid in the pelvis, likely reactive. There is no free intraperitoneal air. Musculoskeletal: There is no acute osseous abnormality or aggressive osseous lesion. IMPRESSION: 1. Marked wall thickening and mucosal hyperemia in the distal small bowel consistent with nonspecific infectious/inflammatory enteritis. Differential would include medication side effect given recent initiation of  chemotherapy. 2. Interval partial colectomy for colonic malignancy resection without evidence of complication at the anastomotic site. 3. Scattered subcentimeter upper abdominal lymph nodes are unchanged. No new or progressive lymphadenopathy in the abdomen or pelvis. Aortic Atherosclerosis (ICD10-I70.0). Electronically Signed   By: PValetta MoleM.D.   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, MSI-high, loss of MLH1 and PMS2 expression, negative for BRAF V600E mutation, MLH1 hypermethylation absent  colonoscopy 10/05/2020-near completely obstructing mass at the hepatic flexure-could not be passed, polyps in the sigmoid and transverse colon CTs 10/15/2020-short segment wall thickening with adjacent inflammatory stranding at the hepatic flexure, prominent mesenteric nodes adjacent to the colonic neoplasm measuring up to 5 mm, prominent gastropathic, hepatoduodenal ligament, and right sided retroperitoneal nodes measured up to 7 mm, subcentimeter hypodense hypodensities-too small to characterize, tiny right middle lobe nodule 12/13/2020-ctDNA not detected Cycle 1 CAPOX 12/13/2020, Xeloda discontinued around 12/24/2020   Tubular adenoma on the colonoscopy 10/05/2020 and on colonoscopy 06[facility]14/[facility]  Mr. DOsleyappears improved.  He is having less abdominal pain and diarrhea at this time.  Diarrhea likely due to recent Xeloda and will take several weeks to completely resolve.  We will hold Xeloda in the future and consider him  for other systemic chemotherapy such as FOLFOX.  Recommend to advance diet as tolerated.  He would like to try a regular diet today.  Will DC IV Dilaudid and changed to Dilaudid 2 mg p.o.  every 4 hours as needed.  Continue Imodium and consider addition Lomotil if Imodium not fully effective.  Continue Phenergan for nausea.  Recommendations: 1.  Advance diet to regular. 2.  Continue Phenergan. 3.  We will DC IV Dilaudid and changed to Dilaudid 2 mg p.o. every 4 hours as needed. 4.  Ambulate. 5.  We will plan to change chemotherapy to FOLFOX with future chemotherapy cycles.  He is currently scheduled for follow-up later this week, but will delay appointment until after symptoms resolved.   Future Appointments  Date Time Provider [place]  01/04/2021  9:30 AM [facility] PHLEBOTOMIST [facility] None  01/04/2021  9:45 AM [facility] FLUSH ROOM [facility] None  01/04/2021 10:00 AM Ladell Pier, MD [facility] None  01/04/2021 10:30 AM [facility] CHAIR 4 [facility] None  01/24/2021 10:15 AM [facility] PHLEBOTOMIST [facility] None  01/24/2021 10:45 AM Owens Shark, NP [facility] None  01/24/2021 11:15 AM [facility] CHAIR 1 [facility] None  02/14/2021 10:00 AM [facility] PHLEBOTOMIST [facility] None  02/14/2021 10:40 AM Ladell Pier, MD [facility] None  02/14/2021 11:00 AM [facility] CHAIR 1 [facility] None  08/23/2021  8:30 AM Denita Lung, MD [facility] [place]      LOS: 3 days   Mikey Bussing, DNP, AGPCNP-BC, AOCNP 01/03/21 Dylan Fischer was interviewed and examined.  His wife was at the bedside when I saw him this morning.  He was admitted 12/30/2020 with severe nausea/vomiting, diarrhea, and abdominal pain following cycle 1 CAPOX.  He is now at day 21.  He has persistent nausea and abdominal pain.  Diarrhea has improved.  I suspect his symptoms are related to capecitabine induced enteritis.  I discussed his current status and treatment plan with Dylan Fischer and his wife.  He will not receive further capecitabine.  We will submit DPD deficiency testing, but I think this is unlikely.  The plan is to consider proceeding with FOLFOX chemotherapy in 1-2 weeks.  I recommend advancing his diet  as tolerated, increasing ambulation, and converting to an oral narcotic regimen.  I was present for greater than 50% of today's visit.  I performed medical decision making.

## 2021-01-03 NOTE — Progress Notes (Signed)
PROGRESS NOTE   Dylan Fischer  GMW:[id number]    DOB: 1960/10/29    DOA: 12/30/2020  PCP: Pcp, No   I have briefly reviewed patients previous medical records in [facility].  Chief Complaint  Patient presents with   Abdominal Pain    Brief Narrative:  60 year old married male with medical history significant for Colon cancer, s/p laparoscopic right hemicolectomy 11/11/2020, started cycle 1 of CAPOX 12/13/2020, developed nausea and vomiting 1 day 3, then became constipated followed by persistent/intractable diarrhea and abdominal pain.  Symptoms ongoing for almost 2 weeks.  Associated with lightheadedness, fatigue, malaise, low-grade fevers, poor oral intake.  Seen in the ED and cancer center for IV hydration for the past 5 days prior to admission.  Also has PMH of hypertension, hyperlipidemia.  CT abdomen showed findings consistent with nonspecific enteritis.  Admitted for acute gastro enteritis,?  Infectious versus related to recent chemotherapy, associated dehydration, and electrolyte abnormalities.  Progressing very gradually.    Assessment & Plan:  Principal Problem:   Enteritis Active Problems:   Essential hypertension, benign   Hyperlipidemia   Colon cancer (HCC)   Hypokalemia   Hyponatremia   Moderate protein malnutrition ([facility]CC)   Aortic atherosclerosis ([facility])   Acute gastroenteritis   Chemotherapy/capecitabine related acute enteritis - Symptoms ongoing for almost 2 weeks since his first cycle of CAPOX treatment 12/13/2020 which is now on hold. - C. difficile testing 12/25/2020 negative.  GI panel PCR negative. - CTA A/P with contrast 10/20: Marked wall thickening and mucosal hyperemia in the distal small bowel consistent with nonspecific infectious/inflammatory enteritis.  No evidence of complication of recent partial colectomy anastomotic site. -Treating supportively since admission.  Has been slow to improve but is improving.  Initially on IV antibiotics but since low  index of suspicion for infectious etiology, these were discontinued. - Continue scheduled IV Zofran, as needed IV Phenergan for intractable nausea and vomiting, scheduled Imodium for diarrhea, and gentle IV fluids, advancing diet as tolerated. - As per patient and spouse, oncologist advised them today that symptoms may sometimes take 3 to 5 weeks to resolve.  Dehydration with hyponatremia - Secondary to poor oral intake.   - Gentle IV fluids until oral intake has improved.  Hypokalemia, persistent - Likely related to GI losses and poor oral intake -Replaced.  Magnesium normal.  Essential hypertension - Controlled off of antihypertensives.  Likely related to volume depletion. - Continue to hold telmisartan and HCTZ.  Hyperlipidemia/aortic atherosclerosis - Rosuvastatin on hold, resume when able to tolerate p.o. well  Colon cancer, s/p laparoscopic right hemicolectomy on chemotherapy - Outpatient follow-up with oncology. - Oncology follow-up appreciated.  Moderate protein calorie malnutrition - We will get dietitian consultation for input.  Body mass index is 27.02 kg/m.    DVT prophylaxis: enoxaparin (LOVENOX) injection 40 mg Start: 12/31/20 1000     Code Status: Full Code Family Communication: I have been discussing with patient's spouse at bedside on a daily basis including today. Disposition:  Status is: Inpatient  The patient will require care spanning > 2 midnights and should be moved to inpatient because: Ongoing significant abdominal pain, poor oral intake, dehydration requiring IV fluids, enteritis on IV antibiotics.  DC home when consistently able to tolerate p.o., pain controlled and reduced nausea and vomiting.       Consultants:   None.  Procedures:   None.  Antimicrobials:    Anti-infectives (From admission, onward)    Start     Dose/Rate Route Frequency Ordered  Stop   12/31/20 0100  metroNIDAZOLE (FLAGYL) IVPB 500 mg  Status:  Discontinued         500 mg 100 mL/hr over 60 Minutes Intravenous Every 12 hours 12/30/20 1233 01/01/21 1020   12/30/20 2200  ceFEPIme (MAXIPIME) 2 g in sodium chloride 0.9 % 100 mL IVPB  Status:  Discontinued       Note to Pharmacy: Please adjust dosage as needed.  Thank you.   2 g 200 mL/hr over 30 Minutes Intravenous Every 12 hours 12/30/20 1233 12/30/20 1236   12/30/20 2100  ceFEPIme (MAXIPIME) 2 g in sodium chloride 0.9 % 100 mL IVPB  Status:  Discontinued       Note to Pharmacy: Please adjust dosage as needed.  Thank you.   2 g 200 mL/hr over 30 Minutes Intravenous Every 8 hours 12/30/20 1237 01/02/21 0945   12/30/20 1230  ceFEPIme (MAXIPIME) 2 g in sodium chloride 0.9 % 100 mL IVPB        2 g 200 mL/hr over 30 Minutes Intravenous  Once 12/30/20 1215 12/30/20 1417   12/30/20 1230  metroNIDAZOLE (FLAGYL) IVPB 500 mg        500 mg 100 mL/hr over 60 Minutes Intravenous  Once 12/30/20 1215 12/30/20 1624         Subjective:  Overall better compared to yesterday.  Nausea, controlled on above meds.  Able to eat soft food better.  His wife got him food from [employer].  Diarrhea also decreasing in frequency and volume.  Abdominal pain definitely better.  Objective:   Vitals:   01/02/21 1744 01/02/21 2116 01/03/21 0343 01/03/21 1459  BP: 123/80 130/81 115/67 114/68  Pulse: 79 96 92 80  Resp: 18 20 20 17   Temp: 98.1 F (36.7 C) 98.4 F (36.9 C) 98.4 F (36.9 C) 97.8 F (36.6 C)  TempSrc: Oral Axillary Oral Oral  SpO2: 99% 94% 97% 100%  Weight:        General exam: Middle-age male, moderately built and nourished lying comfortably propped up in bed. Respiratory system: Clear to auscultation.  No increased work of breathing. Cardiovascular system: S1 and S2 heard, RRR.  No JVD, murmurs or pedal edema. Gastrointestinal system: Nondistended, soft and nontender.  Normal bowel sounds heard Central nervous system: Alert and oriented. No focal neurological deficits. Extremities: Symmetric 5 x 5  power. Skin: No rashes, lesions or ulcers Psychiatry: Judgement and insight appear normal. Mood & affect appropriate.     Data Reviewed:   I have personally reviewed following labs and imaging studies   CBC: Recent Labs  Lab 12/28/20 1116 12/30/20 0732 12/31/20 0505 01/03/21 0511  WBC 4.9 5.2 7.4 5.8  NEUTROABS 3.3 3.6  --   --   HGB 14.4 14.1 13.2 11.6*  HCT 41.2 41.1 39.0 35.9*  MCV 78.2* 80.1 80.6 84.1  PLT 236 247 240 381    Basic Metabolic Panel: Recent Labs  Lab 12/28/20 1116 12/28/20 1116 12/30/20 0732 12/31/20 0505 01/01/21 0548 01/02/21 0527 01/03/21 0511  NA 135  --  134* 134* 132* 133* 133*  K 3.4*  --  3.0* 3.1* 3.2* 3.8 3.6  CL 99  --  98 100 101 101 102  CO2 28  --  26 24 27 27 27   GLUCOSE 155*  --  122* 116* 98 119* 102*  BUN 15  --  11 11 9  7* 6*  CREATININE 0.74   < > 0.97 0.80 0.73 0.62 0.87  CALCIUM 8.8*  --  7.9* 7.4* 7.2* 7.1* 7.0*  MG 2.2  --  1.9 1.8  --   --   --    < > = values in this interval not displayed.    Liver Function Tests: Recent Labs  Lab 12/30/20 0732 12/31/20 0505 01/03/21 0511  AST 24 23 20   ALT 23 23 17   ALKPHOS 44 41 46  BILITOT 1.3* 1.3* 0.6  PROT 5.1* 4.6* 4.0*  ALBUMIN 2.5* 2.1* 1.8*    CBG: No results for input(s): GLUCAP in the last 168 hours.  Microbiology Studies:   Recent Results (from the past 240 hour(s))  C Difficile Quick Screen w PCR reflex     Status: None   Collection Time: 12/25/20  9:30 AM   Specimen: STOOL  Result Value Ref Range Status   C Diff antigen NEGATIVE NEGATIVE Final   C Diff toxin NEGATIVE NEGATIVE Final   C Diff interpretation No C. difficile detected.  Final    Comment: Performed at [facility], [place] [address], [place], [place] [address]  Gastrointestinal Panel by PCR , Stool     Status: None   Collection Time: 12/31/20  5:23 AM   Specimen: Stool  Result Value Ref Range Status   Campylobacter species NOT DETECTED NOT DETECTED Final   Plesimonas  shigelloides NOT DETECTED NOT DETECTED Final   Salmonella species NOT DETECTED NOT DETECTED Final   Yersinia enterocolitica NOT DETECTED NOT DETECTED Final   Vibrio species NOT DETECTED NOT DETECTED Final   Vibrio cholerae NOT DETECTED NOT DETECTED Final   Enteroaggregative E coli (EAEC) NOT DETECTED NOT DETECTED Final   Enteropathogenic E coli (EPEC) NOT DETECTED NOT DETECTED Final   Enterotoxigenic E coli (ETEC) NOT DETECTED NOT DETECTED Final   Shiga like toxin producing E coli (STEC) NOT DETECTED NOT DETECTED Final   Shigella/Enteroinvasive E coli (EIEC) NOT DETECTED NOT DETECTED Final   Cryptosporidium NOT DETECTED NOT DETECTED Final   Cyclospora cayetanensis NOT DETECTED NOT DETECTED Final   Entamoeba histolytica NOT DETECTED NOT DETECTED Final   Giardia lamblia NOT DETECTED NOT DETECTED Final   Adenovirus F40/41 NOT DETECTED NOT DETECTED Final   Astrovirus NOT DETECTED NOT DETECTED Final   Norovirus GI/GII NOT DETECTED NOT DETECTED Final   Rotavirus A NOT DETECTED NOT DETECTED Final   Sapovirus (I, II, IV, and V) NOT DETECTED NOT DETECTED Final    Comment: Performed at [facility], [address] [place], [place] [address]  Resp Panel by RT-PCR (Flu A&B, Covid) Nasopharyngeal Swab     Status: None   Collection Time: 12/31/20  6:20 PM   Specimen: Nasopharyngeal Swab; Nasopharyngeal(NP) swabs in vial transport medium  Result Value Ref Range Status   SARS Coronavirus 2 by RT PCR NEGATIVE NEGATIVE Final    Comment: (NOTE) SARS-CoV-2 target nucleic acids are NOT DETECTED.  The SARS-CoV-2 RNA is generally detectable in upper respiratory specimens during the acute phase of infection. The lowest concentration of SARS-CoV-2 viral copies this assay can detect is 138 copies/mL. A negative result does not preclude SARS-Cov-2 infection and should not be used as the sole basis for treatment or other patient management decisions. A negative result may occur with  improper  specimen collection/handling, submission of specimen other than nasopharyngeal swab, presence of viral mutation(s) within the areas targeted by this assay, and inadequate number of viral copies(<138 copies/mL). A negative result must be combined with clinical observations, patient history, and epidemiological information. The expected result is Negative.  Fact  Sheet for Patients:  EntrepreneurPulse.com.au  Fact Sheet for Healthcare Providers:  IncredibleEmployment.be  This test is no t yet approved or cleared by the [place] FDA and  has been authorized for detection and/or diagnosis of SARS-CoV-2 by FDA under an Emergency Use Authorization (EUA). This EUA will remain  in effect (meaning this test can be used) for the duration of the COVID-19 declaration under Section 564(b)(1) of the Act, 21 U.S.C.section 360bbb-3(b)(1), unless the authorization is terminated  or revoked sooner.       Influenza A by PCR NEGATIVE NEGATIVE Final   Influenza B by PCR NEGATIVE NEGATIVE Final    Comment: (NOTE) The Xpert Xpress SARS-CoV-2/FLU/RSV plus assay is intended as an aid in the diagnosis of influenza from Nasopharyngeal swab specimens and should not be used as a sole basis for treatment. Nasal washings and aspirates are unacceptable for Xpert Xpress SARS-CoV-2/FLU/RSV testing.  Fact Sheet for Patients: EntrepreneurPulse.com.au  Fact Sheet for Healthcare Providers: IncredibleEmployment.be  This test is not yet approved or cleared by the [place] FDA and has been authorized for detection and/or diagnosis of SARS-CoV-2 by FDA under an Emergency Use Authorization (EUA). This EUA will remain in effect (meaning this test can be used) for the duration of the COVID-19 declaration under Section 564(b)(1) of the Act, 21 U.S.C. section 360bbb-3(b)(1), unless the authorization is terminated or revoked.  Performed at  [facility], [place] [address] [place], [place] [address]      Radiology Studies:  No results found.   Scheduled Meds:    enoxaparin (LOVENOX) injection  40 mg Subcutaneous Q24H   feeding supplement (KATE FARMS STANDARD 1.4)  325 mL Oral BID BM   loperamide  2 mg Oral TID   multivitamin with minerals  1 tablet Oral Daily   ondansetron (ZOFRAN) IV  4 mg Intravenous Q8H   ondansetron  4 mg Oral Once   pantoprazole  40 mg Oral Daily    Continuous Infusions:    0.9 % NaCl with KCl 40 mEq / L     promethazine (PHENERGAN) injection (IM or IVPB) 12.5 mg (01/03/21 1011)     LOS: 3 days     Vernell Leep, MD, [place], [facility]    To contact the attending provider between 7A-7P or the covering provider during after hours 7P-7A, please log into the web site [email or web address] and access using universal [place] password for that web site. If you do not have the password, please call the hospital operator.  01/03/2021, 3:20 PM

## 2021-01-04 ENCOUNTER — Inpatient Hospital Stay: Payer: 59

## 2021-01-04 ENCOUNTER — Other Ambulatory Visit: Payer: 59

## 2021-01-04 ENCOUNTER — Inpatient Hospital Stay: Payer: 59 | Admitting: Oncology

## 2021-01-04 LAB — CBC
HCT: 34.6 % — ABNORMAL LOW (ref 39.0–52.0)
Hemoglobin: 11.2 g/dL — ABNORMAL LOW (ref 13.0–17.0)
MCH: 27.1 pg (ref 26.0–34.0)
MCHC: 32.4 g/dL (ref 30.0–36.0)
MCV: 83.6 fL (ref 80.0–100.0)
Platelets: 221 10*3/uL (ref 150–400)
RBC: 4.14 MIL/uL — ABNORMAL LOW (ref 4.22–5.81)
RDW: 16.3 % — ABNORMAL HIGH (ref 11.5–15.5)
WBC: 4.9 10*3/uL (ref 4.0–10.5)
nRBC: 0 % (ref 0.0–0.2)

## 2021-01-04 LAB — BASIC METABOLIC PANEL
Anion gap: 4 — ABNORMAL LOW (ref 5–15)
BUN: 6 mg/dL — ABNORMAL LOW (ref 8–23)
CO2: 29 mmol/L (ref 22–32)
Calcium: 7 mg/dL — ABNORMAL LOW (ref 8.9–10.3)
Chloride: 102 mmol/L (ref 98–111)
Creatinine, Ser: 0.74 mg/dL (ref 0.61–1.24)
GFR, Estimated: >60 mL/min (ref >60)
Glucose, Bld: 107 mg/dL — ABNORMAL HIGH (ref 70–99)
Potassium: 3.9 mmol/L (ref 3.5–5.1)
Sodium: 135 mmol/L (ref 135–145)

## 2021-01-04 MED ORDER — ONDANSETRON HCL 4 MG PO TABS
4.0000 mg | ORAL_TABLET | Freq: Three times a day (TID) | ORAL | Status: DC | PRN
  Administered 2021-01-04: 4 mg via ORAL
  Filled 2021-01-04: qty 1

## 2021-01-04 MED ORDER — PROMETHAZINE HCL 25 MG PO TABS
12.5000 mg | ORAL_TABLET | Freq: Four times a day (QID) | ORAL | Status: DC | PRN
  Administered 2021-01-04 – 2021-01-05 (×3): 12.5 mg via ORAL
  Filled 2021-01-04 (×3): qty 1

## 2021-01-04 MED ORDER — LOPERAMIDE HCL 2 MG PO CAPS: 2.0000 mg | ORAL_CAPSULE | Freq: Four times a day (QID) | ORAL | Status: DC | PRN

## 2021-01-04 MED ORDER — HYDROMORPHONE HCL 2 MG PO TABS
2.0000 mg | ORAL_TABLET | ORAL | Status: DC | PRN
Start: 2021-01-04 — End: 2021-01-05
  Administered 2021-01-04 – 2021-01-05 (×4): 2 mg via ORAL
  Filled 2021-01-04 (×3): qty 1

## 2021-01-04 NOTE — Progress Notes (Addendum)
HEMATOLOGY-ONCOLOGY PROGRESS NOTE  SUBJECTIVE: [name] reports improvement in abdominal pain.  The pain is relieved with oral Dilaudid.  He continues to have intermittent nausea.  No vomiting.  Diarrhea has improved.  He developed an infiltration of the left arm IV yesterday.  The arm remains swollen.  Oncology History  Colon cancer ([place])  11/11/2020 Initial Diagnosis   Colon cancer ([place])   11/25/2020 Cancer Staging   Staging form: Colon and Rectum, AJCC 8th Edition - Clinical: Stage IIB (cT4a, cN0, cM0) - Signed by Ladell Pier, MD on 11/25/2020 Total positive nodes: 0 Histologic grade (G): G3 Histologic grading system: 4 grade system    12/13/2020 - 12/13/2020 Chemotherapy   Patient is on Treatment Plan : COLORECTAL Xelox (Capeox) q21d     12/27/2020 Genetic Testing   Negative genetic testing.  RECQL [id number] Variant, Unknown Significance.  The report date is December 27, 2020.  The CancerNext-Expanded gene panel offered by [facility] and includes sequencing and rearrangement analysis for the following 77 genes: AIP, ALK, APC*, ATM*, AXIN2, BAP1, BARD1, BLM, BMPR1A, BRCA1*, BRCA2*, BRIP1*, CDC73, CDH1*, CDK4, CDKN1B, CDKN2A, CHEK2*, CTNNA1, DICER1, FANCC, FH, FLCN, GALNT12, KIF1B, LZTR1, MAX, MEN1, MET, MLH1*, MSH2*, MSH3, MSH6*, MUTYH*, NBN, NF1*, NF2, NTHL1, PALB2*, PHOX2B, PMS2*, POT1, PRKAR1A, PTCH1, PTEN*, RAD51C*, RAD51D*, RB1, RECQL, RET, SDHA, SDHAF2, SDHB, SDHC, SDHD, SMAD4, SMARCA4, SMARCB1, SMARCE1, STK11, SUFU, TMEM127, TP53*, TSC1, TSC2, VHL and XRCC2 (sequencing and deletion/duplication); EGFR, EGLN1, HOXB13, KIT, MITF, PDGFRA, POLD1, and POLE (sequencing only); EPCAM and GREM1 (deletion/duplication only). DNA and RNA analyses performed for * genes.    01/04/2021 -  Chemotherapy   Patient is on Treatment Plan : COLORECTAL FOLFOX q14d x 3 months      PHYSICAL EXAMINATION:  Vitals:   01/04/21 0800 01/04/21 1423  BP: 114/83 123/84  Pulse: 74 86  Resp:  18 18  Temp: 98.8 F (37.1 C) 98.6 F (37 C)  SpO2: 98% 99%   Filed Weights   12/31/20 0500 12/31/20 1459  Weight: 217 lb 2.5 oz (98.5 kg) 222 lb 0.1 oz (100.7 kg)    Intake/Output from previous day: 10/24 0701 - 10/25 0700 In: 92.4 [I.V.:1; IV Piggyback:91.3] Out: -   GENERAL:alert, no distress and comfortable OROPHARYNX: No ulcers ABDOMEN: Soft, no hepatosplenomegaly, mild diffuse tenderness Vascular: Mild edema throughout the left lower arm with mild induration at the volar surface of the left forearm below the elbow, no palpable cord  LABORATORY DATA:  I have reviewed the data as listed CMP Latest Ref Rng & Units 01/04/2021 01/03/2021 01/02/2021  Glucose 70 - 99 mg/dL 107(H) 102(H) 119(H)  BUN 8 - 23 mg/dL 6(L) 6(L) 7(L)  Creatinine 0.61 - 1.24 mg/dL 0.74 0.87 0.62  Sodium 135 - 145 mmol/L 135 133(L) 133(L)  Potassium 3.5 - 5.1 mmol/L 3.9 3.6 3.8  Chloride 98 - 111 mmol/L 102 102 101  CO2 22 - 32 mmol/L _0 Calcium 8.9 - 10.3 mg/dL 7.0(L) 7.0(L) 7.1(L)  Total Protein 6.5 - 8.1 g/dL - 4.0(L) -  Total Bilirubin 0.3 - 1.2 mg/dL - 0.6 -  Alkaline Phos 38 - 126 U/L - 46 -  AST 15 - 41 U/L - 20 -  ALT 0 - 44 U/L - 17 -    Lab Results  Component Value Date   WBC 4.9 01/04/2021   HGB 11.2 (L) 01/04/2021   HCT 34.6 (L) 01/04/2021   MCV 83.6 01/04/2021   PLT 221 01/04/2021   NEUTROABS 3.6 12/30/2020  CT ABDOMEN PELVIS W CONTRAST  Result Date: 12/30/2020 CLINICAL DATA:  Acute abdominal pain. History of colon cancer, started chemotherapy 2 weeks ago EXAM: CT ABDOMEN AND PELVIS WITH CONTRAST TECHNIQUE: Multidetector CT imaging of the abdomen and pelvis was performed using the standard protocol following bolus administration of intravenous contrast. CONTRAST:  12mL OMNIPAQUE IOHEXOL 350 MG/ML SOLN COMPARISON:  CT abdomen/pelvis 10/15/2020 FINDINGS: Lower chest: There is minimal bibasilar subsegmental atelectasis. The imaged heart is unremarkable. Hepatobiliary: A  subcentimeter hypodense lesion in the right hepatic lobe near the dome is unchanged, too small to characterize. The liver is otherwise unremarkable. The gallbladder is unremarkable. There is no biliary ductal dilatation. Pancreas: Unremarkable. Spleen: Unremarkable. Adrenals/Urinary Tract: The adrenals are unremarkable. Subcentimeter hypodense lesions in the kidneys are unchanged, again too small to characterize but likely reflect small cysts. The kidneys are otherwise unremarkable, with no other focal lesions. There are no stones. There is no hydronephrosis or hydroureter. Stomach/Bowel: The stomach is unremarkable. There has been interval partial colectomy for colonic malignancy resection. There is no evidence of complication at the anastomotic site. There is marked wall thickening involving the distal small bowel with associated mucosal hyperemia and surrounding fat stranding consistent with infectious or inflammatory enteritis. There is no pneumatosis intestinalis. There is no evidence of bowel obstruction. Vascular/Lymphatic: There is scattered calcified atherosclerotic plaque throughout the nonaneurysmal abdominal aorta. The major branch vessels are patent. The main portal and splenic veins are patent. There is no portal venous gas. Scattered subcentimeter periportal lymph nodes measuring up to 6-7 mm are unchanged. There is no new or progressive lymphadenopathy in the abdomen or pelvis. Reproductive: The prostate and seminal vesicles are unremarkable. Other: There is small volume free fluid in the pelvis, likely reactive. There is no free intraperitoneal air. Musculoskeletal: There is no acute osseous abnormality or aggressive osseous lesion. IMPRESSION: 1. Marked wall thickening and mucosal hyperemia in the distal small bowel consistent with nonspecific infectious/inflammatory enteritis. Differential would include medication side effect given recent initiation of chemotherapy. 2. Interval partial colectomy  for colonic malignancy resection without evidence of complication at the anastomotic site. 3. Scattered subcentimeter upper abdominal lymph nodes are unchanged. No new or progressive lymphadenopathy in the abdomen or pelvis. Aortic Atherosclerosis (ICD10-I70.0). Electronically Signed   By: Valetta Mole M.D.   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, MSI-high, loss of MLH1 and PMS2 expression, negative for BRAF V600E mutation, MLH1 hypermethylation absent  colonoscopy 10/05/2020-near completely obstructing mass at the hepatic flexure-could not be passed, polyps in the sigmoid and transverse colon CTs 10/15/2020-short segment wall thickening with adjacent inflammatory stranding at the hepatic flexure, prominent mesenteric nodes adjacent to the colonic neoplasm measuring up to 5 mm, prominent gastropathic, hepatoduodenal ligament, and right sided retroperitoneal nodes measured up to 7 mm, subcentimeter hypodense hypodensities-too small to characterize, tiny right middle lobe nodule 12/13/2020-ctDNA not detected Cycle 1 CAPOX 12/13/2020, Xeloda discontinued around 12/24/2020   Tubular adenoma on the colonoscopy 10/05/2020 and on colonoscopy 06/14/2010 Hypertension [facility] admission 12/30/2020-acute gastroenteritis/colitis  Mr. Stenseth was admitted with chemotherapy induced enteritis.  He appears to have developed toxicity from capecitabine.  His symptoms are slowly improving.  He will be changed to an oral pain and antiemetic regimen today.  He will advance his oral intake as tolerated.  He can  be discharged tomorrow if he continues to improve.  Recommendations: 1.  Advance diet as tolerated 2.  Change to oral Phenergan and Zofran 3.  Continue as needed oral Dilaudid for pain 4.   Increase ambulation 5.  Outpatient follow-up will be scheduled at the Cancer center for the week of 01/10/2021 6.  Change Imodium to as needed    Future Appointments  Date Time Provider [place]  01/24/2021 10:15 AM [facility] PHLEBOTOMIST [facility] None  01/24/2021 10:45 AM Owens Shark, NP [facility] None  01/24/2021 11:15 AM [facility] CHAIR 1 [facility] None  02/14/2021 10:00 AM [facility] PHLEBOTOMIST [facility] None  02/14/2021 10:40 AM Ladell Pier, MD [facility] None  02/14/2021 11:00 AM [facility] CHAIR 1 [facility] None  08/23/2021  8:30 AM Denita Lung, MD [facility] [place]      LOS: 4 days   Betsy Coder, MD 01/04/21

## 2021-01-04 NOTE — Progress Notes (Signed)
PROGRESS NOTE   Lin Glazier  OZH:[id number]    DOB: [birth year]    DOA: 12/30/2020  PCP: Pcp, No   I have briefly reviewed patients previous medical records in [facility].  Chief Complaint  Patient presents with   Abdominal Pain    Brief Narrative:  60 year old married male with medical history significant for Colon cancer, s/p laparoscopic right hemicolectomy 11/11/2020, started cycle 1 of CAPOX 12/13/2020, developed nausea and vomiting 1 day 3, then became constipated followed by persistent/intractable diarrhea and abdominal pain.  Symptoms ongoing for almost 2 weeks.  Associated with lightheadedness, fatigue, malaise, low-grade fevers, poor oral intake.  Seen in the ED and cancer center for IV hydration for the past 5 days prior to admission.  Also has PMH of hypertension, hyperlipidemia.  CT abdomen showed findings consistent with nonspecific enteritis.  Admitted for chemotherapy related acute enteritis, associated dehydration, and electrolyte abnormalities.  Gradually improving and could possibly discharge home 10/26.  Assessment & Plan:  Principal Problem:   Enteritis Active Problems:   Essential hypertension, benign   Hyperlipidemia   Colon cancer (HCC)   Hypokalemia   Hyponatremia   Moderate protein malnutrition (HCC)   Aortic atherosclerosis ([facility])   Acute gastroenteritis   Chemotherapy/capecitabine related acute enteritis - Symptoms ongoing for almost 2 weeks since his first cycle of CAPOX treatment 12/13/2020 which is now indefinitely discontinued. - C. difficile testing 12/25/2020 negative.  GI panel PCR negative. - CTA A/P with contrast 10/20: Marked wall thickening and mucosal hyperemia in the distal small bowel consistent with nonspecific infectious/inflammatory enteritis.  No evidence of complication of recent partial colectomy anastomotic site. -Treated supportively since admission including bowel rest, IV fluids, briefly on broad-spectrum IV antibiotics which  were discontinued, scheduled IV Zofran and as needed IV Phenergan for intractable symptoms, scheduled Imodium for diarrhea and PPI. - As per patient and spouse, oncologist advised them that symptoms may sometimes take 3 to 5 weeks to resolve. -Over the last 2-3 days, patient has been slowly but steadily improving.  Discontinued IV fluids, IV opioids.  Encourage oral intake, mobilization.  Dehydration with hyponatremia - Secondary to poor oral intake and GI losses. - Resolved after IV fluids.  Now third spacing of fluids in his arms and legs.  Discontinued IV fluids.  Hypokalemia, persistent - Likely related to GI losses and poor oral intake - Replaced.  Magnesium normal.  Essential hypertension - Controlled or even soft sometimes while off of antihypertensives.   - Continue to hold telmisartan and HCTZ possibly due to close outpatient follow-up with PCP.  Hyperlipidemia/aortic atherosclerosis - Rosuvastatin on hold, resume at discharge.  Colon cancer, s/p laparoscopic right hemicolectomy on chemotherapy - Outpatient follow-up with oncology. - Oncology follow-up appreciated.  Moderate protein calorie malnutrition - Dietitian consulted  Body mass index is 27.02 kg/m.    DVT prophylaxis: enoxaparin (LOVENOX) injection 40 mg Start: 12/31/20 1000     Code Status: Full Code Family Communication: I have been discussing with patient's spouse at bedside on a daily basis including today. Disposition:  Status is: Inpatient  Continues to gradually improve.  If he is able to tolerate advancing diet with no significant pain, nausea or diarrhea, possible discharge home on 10/26.       Consultants:   Oncology.  Procedures:   None.  Antimicrobials:    Anti-infectives (From admission, onward)    Start     Dose/Rate Route Frequency Ordered Stop   12/31/20 0100  metroNIDAZOLE (FLAGYL) IVPB 500 mg  Status:  Discontinued        500 mg 100 mL/hr over 60 Minutes Intravenous Every 12  hours 12/30/20 1233 01/01/21 1020   12/30/20 2200  ceFEPIme (MAXIPIME) 2 g in sodium chloride 0.9 % 100 mL IVPB  Status:  Discontinued       Note to Pharmacy: Please adjust dosage as needed.  Thank you.   2 g 200 mL/hr over 30 Minutes Intravenous Every 12 hours 12/30/20 1233 12/30/20 1236   12/30/20 2100  ceFEPIme (MAXIPIME) 2 g in sodium chloride 0.9 % 100 mL IVPB  Status:  Discontinued       Note to Pharmacy: Please adjust dosage as needed.  Thank you.   2 g 200 mL/hr over 30 Minutes Intravenous Every 8 hours 12/30/20 1237 01/02/21 0945   12/30/20 1230  ceFEPIme (MAXIPIME) 2 g in sodium chloride 0.9 % 100 mL IVPB        2 g 200 mL/hr over 30 Minutes Intravenous  Once 12/30/20 1215 12/30/20 1417   12/30/20 1230  metroNIDAZOLE (FLAGYL) IVPB 500 mg        500 mg 100 mL/hr over 60 Minutes Intravenous  Once 12/30/20 1215 12/30/20 1624         Subjective:  Wished him happy birthday.  Feels better.  Diarrhea improved and mostly passing flatus.  Abdominal pain controlled on p.o. meds.  Wishes to stop Oxy IR and continue p.o. Dilaudid alone.  Nausea improved, no vomiting, tolerated well at dinner and breakfast this morning.  Objective:   Vitals:   01/03/21 1459 01/03/21 2044 01/04/21 0351 01/04/21 0800  BP: 114/68 130/83 106/62 114/83  Pulse: 80 86 81 74  Resp: 17 20 20 18   Temp: 97.8 F (36.6 C) 98 F (36.7 C) 98.4 F (36.9 C) 98.8 F (37.1 C)  TempSrc: Oral Oral Oral Oral  SpO2: 100% 98% 97% 98%  Weight:        General exam: Middle-age male, moderately built and nourished lying comfortably propped up in bed.  Subsequently seen ambulating independently and without discomfort in the halls. Respiratory system: Clear to auscultation.  No increased work of breathing. Cardiovascular system: S1 and S2 heard, RRR.  No JVD, murmurs or pedal edema. Gastrointestinal system: Nondistended, soft and nontender.  Normal bowel sounds heard. Central nervous system: Alert and oriented. No focal  neurological deficits. Extremities: Symmetric 5 x 5 power.  Dorsal hand and trace leg edema. Skin: No rashes, lesions or ulcers Psychiatry: Judgement and insight appear normal. Mood & affect appropriate.     Data Reviewed:   I have personally reviewed following labs and imaging studies   CBC: Recent Labs  Lab 12/30/20 0732 12/31/20 0505 01/03/21 0511 01/04/21 0553  WBC 5.2 7.4 5.8 4.9  NEUTROABS 3.6  --   --   --   HGB 14.1 13.2 11.6* 11.2*  HCT 41.1 39.0 35.9* 34.6*  MCV 80.1 80.6 84.1 83.6  PLT 247 240 278 035    Basic Metabolic Panel: Recent Labs  Lab 12/30/20 0732 12/31/20 0505 01/01/21 0548 01/02/21 0527 01/03/21 0511 01/04/21 0553  NA 134* 134*   < > 133* 133* 135  K 3.0* 3.1*   < > 3.8 3.6 3.9  CL 98 100   < > 101 102 102  CO2 26 24   < > 27 27 29   GLUCOSE 122* 116*   < > 119* 102* 107*  BUN 11 11   < > 7* 6* 6*  CREATININE 0.97 0.80   < >  0.62 0.87 0.74  CALCIUM 7.9* 7.4*   < > 7.1* 7.0* 7.0*  MG 1.9 1.8  --   --   --   --    < > = values in this interval not displayed.    Liver Function Tests: Recent Labs  Lab 12/30/20 0732 12/31/20 0505 01/03/21 0511  AST 24 23 20   ALT 23 23 17   ALKPHOS 44 41 46  BILITOT 1.3* 1.3* 0.6  PROT 5.1* 4.6* 4.0*  ALBUMIN 2.5* 2.1* 1.8*    CBG: No results for input(s): GLUCAP in the last 168 hours.  Microbiology Studies:   Recent Results (from the past 240 hour(s))  Gastrointestinal Panel by PCR , Stool     Status: None   Collection Time: 12/31/20  5:23 AM   Specimen: Stool  Result Value Ref Range Status   Campylobacter species NOT DETECTED NOT DETECTED Final   Plesimonas shigelloides NOT DETECTED NOT DETECTED Final   Salmonella species NOT DETECTED NOT DETECTED Final   Yersinia enterocolitica NOT DETECTED NOT DETECTED Final   Vibrio species NOT DETECTED NOT DETECTED Final   Vibrio cholerae NOT DETECTED NOT DETECTED Final   Enteroaggregative E coli (EAEC) NOT DETECTED NOT DETECTED Final    Enteropathogenic E coli (EPEC) NOT DETECTED NOT DETECTED Final   Enterotoxigenic E coli (ETEC) NOT DETECTED NOT DETECTED Final   Shiga like toxin producing E coli (STEC) NOT DETECTED NOT DETECTED Final   Shigella/Enteroinvasive E coli (EIEC) NOT DETECTED NOT DETECTED Final   Cryptosporidium NOT DETECTED NOT DETECTED Final   Cyclospora cayetanensis NOT DETECTED NOT DETECTED Final   Entamoeba histolytica NOT DETECTED NOT DETECTED Final   Giardia lamblia NOT DETECTED NOT DETECTED Final   Adenovirus F40/41 NOT DETECTED NOT DETECTED Final   Astrovirus NOT DETECTED NOT DETECTED Final   Norovirus GI/GII NOT DETECTED NOT DETECTED Final   Rotavirus A NOT DETECTED NOT DETECTED Final   Sapovirus (I, II, IV, and V) NOT DETECTED NOT DETECTED Final    Comment: Performed at [facility], [address] [place], [place] [address]  Resp Panel by RT-PCR (Flu A&B, Covid) Nasopharyngeal Swab     Status: None   Collection Time: 12/31/20  6:20 PM   Specimen: Nasopharyngeal Swab; Nasopharyngeal(NP) swabs in vial transport medium  Result Value Ref Range Status   SARS Coronavirus 2 by RT PCR NEGATIVE NEGATIVE Final    Comment: (NOTE) SARS-CoV-2 target nucleic acids are NOT DETECTED.  The SARS-CoV-2 RNA is generally detectable in upper respiratory specimens during the acute phase of infection. The lowest concentration of SARS-CoV-2 viral copies this assay can detect is 138 copies/mL. A negative result does not preclude SARS-Cov-2 infection and should not be used as the sole basis for treatment or other patient management decisions. A negative result may occur with  improper specimen collection/handling, submission of specimen other than nasopharyngeal swab, presence of viral mutation(s) within the areas targeted by this assay, and inadequate number of viral copies(<138 copies/mL). A negative result must be combined with clinical observations, patient history, and epidemiological information. The  expected result is Negative.  Fact Sheet for Patients:  EntrepreneurPulse.com.au  Fact Sheet for Healthcare Providers:  IncredibleEmployment.be  This test is no t yet approved or cleared by the [place] FDA and  has been authorized for detection and/or diagnosis of SARS-CoV-2 by FDA under an Emergency Use Authorization (EUA). This EUA will remain  in effect (meaning this test can be used) for the duration of the COVID-19 declaration under  Section 564(b)(1) of the Act, 21 U.S.C.section 360bbb-3(b)(1), unless the authorization is terminated  or revoked sooner.       Influenza A by PCR NEGATIVE NEGATIVE Final   Influenza B by PCR NEGATIVE NEGATIVE Final    Comment: (NOTE) The Xpert Xpress SARS-CoV-2/FLU/RSV plus assay is intended as an aid in the diagnosis of influenza from Nasopharyngeal swab specimens and should not be used as a sole basis for treatment. Nasal washings and aspirates are unacceptable for Xpert Xpress SARS-CoV-2/FLU/RSV testing.  Fact Sheet for Patients: EntrepreneurPulse.com.au  Fact Sheet for Healthcare Providers: IncredibleEmployment.be  This test is not yet approved or cleared by the [place] FDA and has been authorized for detection and/or diagnosis of SARS-CoV-2 by FDA under an Emergency Use Authorization (EUA). This EUA will remain in effect (meaning this test can be used) for the duration of the COVID-19 declaration under Section 564(b)(1) of the Act, 21 U.S.C. section 360bbb-3(b)(1), unless the authorization is terminated or revoked.  Performed at [facility], [place] [address] [place], [place] [address]      Radiology Studies:  No results found.   Scheduled Meds:    enoxaparin (LOVENOX) injection  40 mg Subcutaneous Q24H   feeding supplement (KATE FARMS STANDARD 1.4)  325 mL Oral BID BM   loperamide  2 mg Oral TID   multivitamin with  minerals  1 tablet Oral Daily   ondansetron (ZOFRAN) IV  4 mg Intravenous Q8H   pantoprazole  40 mg Oral Daily    Continuous Infusions:    promethazine (PHENERGAN) injection (IM or IVPB) 12.5 mg (01/03/21 1818)     LOS: 4 days     Vernell Leep, MD, [place], [facility]. [facility]    To contact the attending provider between 7A-7P or the covering provider during after hours 7P-7A, please log into the web site [email or web address] and access using universal [place] password for that web site. If you do not have the password, please call the hospital operator.  01/04/2021, 1:49 PM

## 2021-01-05 DIAGNOSIS — K529 Noninfective gastroenteritis and colitis, unspecified: Secondary | ICD-10-CM | POA: Diagnosis not present

## 2021-01-05 MED ORDER — ADULT MULTIVITAMIN W/MINERALS CH: 1.0000 | ORAL_TABLET | Freq: Every day | ORAL | Status: DC

## 2021-01-05 MED ORDER — PROMETHAZINE HCL 12.5 MG PO TABS: 12.5000 mg | ORAL_TABLET | Freq: Four times a day (QID) | ORAL | 0 refills | Status: DC | PRN

## 2021-01-05 MED ORDER — KATE FARMS STANDARD 1.4 PO LIQD: 325.0000 mL | Freq: Two times a day (BID) | ORAL | Status: DC

## 2021-01-05 MED ORDER — ACETAMINOPHEN 500 MG PO TABS: 500.0000 mg | ORAL_TABLET | Freq: Three times a day (TID) | ORAL | 0 refills | Status: DC | PRN

## 2021-01-05 MED ORDER — OLMESARTAN MEDOXOMIL-HCTZ 20-12.5 MG PO TABS
ORAL_TABLET | ORAL | 0 refills | Status: DC
Start: 2021-01-05 — End: 2021-03-11

## 2021-01-05 MED ORDER — HYDROMORPHONE HCL 2 MG PO TABS: 2.0000 mg | ORAL_TABLET | Freq: Four times a day (QID) | ORAL | 0 refills | Status: AC | PRN

## 2021-01-05 NOTE — Discharge Instructions (Signed)
Dylan Fischer,  You were in the hospital with nausea/vomiting/diarrhea which appears to be related to your chemotherapy. Thankfully your symptoms have improved. Please follow-up with your oncologist next week as discussed.

## 2021-01-05 NOTE — Discharge Summary (Signed)
Physician Discharge Summary  Dylan Fischer IRS:[id number] DOB: Feb 24, 1961 DOA: 12/30/2020  PCP: Pcp, No  Admit date: 12/30/2020 Discharge date: 01/05/2021  Admitted From: Home Disposition: Home  Recommendations for Outpatient Follow-up:  Follow up with PCP in 1 week Please follow up on the following pending results: None  Discharge Condition: Stable CODE STATUS: Full code Diet recommendation: Soft diet   Brief/Interim Summary:  Admission HPI written by Tennis Must, MD   HPI: Dylan Fischer is a 60 y.o. male with medical history significant of colon polyp, hyperlipidemia, hypertension, ocular rosacea, colon cancer on CAPOX who has been having abdominal pain, multiple episodes of diarrhea daily, frequent emesis, lightheadedness, fatigue and malaise for almost 2 weeks since he had his last chemotherapy cycle.  He already has been to the ER and to the cancer center for IV hydration in the past 5 days.  Today he returns to the emergency department as he is having difficulty controlling his symptoms at home.  He has had rigors and chills, but no subjective fever.  No flank pain, melena, hematochezia, dysuria, frequency or hematuria.  No rhinorrhea, sore throat, wheezing or hemoptysis.  He has been mildly dyspneic at times.  No chest pain, palpitations, diaphoresis, PND, orthopnea or pitting edema of the lower extremities.  Denied polyuria, polydipsia, polyphagia or blurred vision.   Hospital course:  Acute enteritis Patient empirically treated with IV fluids and empirica Cefepime/Flagyl which were discontinued after three days of treatment. CTA abdomen/pelvis was significant for nonspecific small bowel enteritis. C difficile and GI pathogen panel negative. Antibiotics discontinued. Infectious workup was unremarkable. Secondary to chemotherapy.  Dehydration Secondary to above. Treated with IV fluids  Hyponatremia Secondary to poor oral intake and GI losses. Resolved with IV  fluids.  Primary hypertension Continue home telmisartan/hydrochlorothiazide.  Hypokalemia Repleted.  Hyperlipidemia Aortic atherosclerosis Continue Crestor  Colon cancer S/p right hemicolectomy. On chemotherapy.  Moderate malnutrition Dietician consulted.   Discharge Diagnoses:  Principal Problem:   Enteritis Active Problems:   Essential hypertension, benign   Hyperlipidemia   Colon cancer (HCC)   Hypokalemia   Hyponatremia   Moderate protein malnutrition (HCC)   Aortic atherosclerosis ([place])   Acute gastroenteritis    Discharge Instructions   Allergies as of 01/05/2021   No Known Allergies      Medication List     STOP taking these medications    prochlorperazine 10 MG tablet Commonly known as: COMPAZINE       TAKE these medications    acetaminophen 500 MG tablet Commonly known as: TYLENOL Take 1-2 tablets (500-1,000 mg total) by mouth every 8 (eight) hours as needed for mild pain. What changed:  how much to take when to take this   calcium carbonate 500 MG chewable tablet Commonly known as: TUMS - dosed in mg elemental calcium Chew 1 tablet by mouth daily as needed for indigestion or heartburn.   capecitabine 500 MG tablet Commonly known as: XELODA Take 4 tablets (2,000 mg total) by mouth 2 (two) times daily after a meal. Take for 14 days, then hold for 7 days. Repeat every 21 days.   diphenoxylate-atropine 2.5-0.025 MG tablet Commonly known as: LOMOTIL Take 1 tablet by mouth 4 (four) times daily as needed for diarrhea or loose stools.   doxycycline 100 MG tablet Commonly known as: VIBRA-TABS Take 1 tablet (100 mg total) by mouth 2 (two) times daily. What changed:  when to take this reasons to take this   feeding supplement (KATE FARMS STANDARD 1.4)  Liqd liquid Take 325 mLs by mouth 2 (two) times daily between meals.   GAS-X PO Take 1 tablet by mouth daily as needed (abdominal pain).   HYDROmorphone 2 MG tablet Commonly known  as: DILAUDID Take 1 tablet (2 mg total) by mouth every 6 (six) hours as needed for up to 3 days for severe pain or moderate pain.   LORazepam 0.5 MG tablet Commonly known as: ATIVAN Take 1 tablet (0.5 mg total) by mouth every 8 (eight) hours as needed (for nausea).   magic mouthwash Soln Take 5-10 mLs by mouth 4 (four) times daily as needed for mouth pain. Swish and spit   multivitamin with minerals Tabs tablet Take 1 tablet by mouth daily.   olmesartan-hydrochlorothiazide 20-12.5 MG tablet Commonly known as: Benicar HCT Hold if still having trouble with nausea/vomiting. Can resume if consistently tolerating oral intake What changed:  how much to take how to take this when to take this additional instructions   omega-3 acid ethyl esters 1 g capsule Commonly known as: LOVAZA TAKE 2 CAPSULES BY MOUTH TWICE A DAY   ondansetron 8 MG tablet Commonly known as: ZOFRAN Take 1 tablet (8 mg total) by mouth every 8 (eight) hours as needed for nausea or vomiting. Do not take until 72 hours after IV chemotherapy   potassium chloride SA 20 MEQ tablet Commonly known as: KLOR-CON Take 1 tablet (20 mEq total) by mouth daily.   promethazine 12.5 MG tablet Commonly known as: PHENERGAN Take 1 tablet (12.5 mg total) by mouth every 6 (six) hours as needed for nausea.   rosuvastatin 40 MG tablet Commonly known as: CRESTOR Take 1 tablet (40 mg total) by mouth daily.        Follow-up Information     Ladell Pier, MD Follow up on 01/13/2021.   Specialty: Oncology Why: For hospital follow-up Contact information: [place] [address] [phone]                No Known Allergies  Consultations: None   Procedures/Studies: CT ABDOMEN PELVIS W CONTRAST  Result Date: 12/30/2020 CLINICAL DATA:  Acute abdominal pain. History of colon cancer, started chemotherapy 2 weeks ago EXAM: CT ABDOMEN AND PELVIS WITH CONTRAST TECHNIQUE: Multidetector CT  imaging of the abdomen and pelvis was performed using the standard protocol following bolus administration of intravenous contrast. CONTRAST:  30mL OMNIPAQUE IOHEXOL 350 MG/ML SOLN COMPARISON:  CT abdomen/pelvis 10/15/2020 FINDINGS: Lower chest: There is minimal bibasilar subsegmental atelectasis. The imaged heart is unremarkable. Hepatobiliary: A subcentimeter hypodense lesion in the right hepatic lobe near the dome is unchanged, too small to characterize. The liver is otherwise unremarkable. The gallbladder is unremarkable. There is no biliary ductal dilatation. Pancreas: Unremarkable. Spleen: Unremarkable. Adrenals/Urinary Tract: The adrenals are unremarkable. Subcentimeter hypodense lesions in the kidneys are unchanged, again too small to characterize but likely reflect small cysts. The kidneys are otherwise unremarkable, with no other focal lesions. There are no stones. There is no hydronephrosis or hydroureter. Stomach/Bowel: The stomach is unremarkable. There has been interval partial colectomy for colonic malignancy resection. There is no evidence of complication at the anastomotic site. There is marked wall thickening involving the distal small bowel with associated mucosal hyperemia and surrounding fat stranding consistent with infectious or inflammatory enteritis. There is no pneumatosis intestinalis. There is no evidence of bowel obstruction. Vascular/Lymphatic: There is scattered calcified atherosclerotic plaque throughout the nonaneurysmal abdominal aorta. The major branch vessels are patent. The main portal and splenic veins  are patent. There is no portal venous gas. Scattered subcentimeter periportal lymph nodes measuring up to 6-7 mm are unchanged. There is no new or progressive lymphadenopathy in the abdomen or pelvis. Reproductive: The prostate and seminal vesicles are unremarkable. Other: There is small volume free fluid in the pelvis, likely reactive. There is no free intraperitoneal air.  Musculoskeletal: There is no acute osseous abnormality or aggressive osseous lesion. IMPRESSION: 1. Marked wall thickening and mucosal hyperemia in the distal small bowel consistent with nonspecific infectious/inflammatory enteritis. Differential would include medication side effect given recent initiation of chemotherapy. 2. Interval partial colectomy for colonic malignancy resection without evidence of complication at the anastomotic site. 3. Scattered subcentimeter upper abdominal lymph nodes are unchanged. No new or progressive lymphadenopathy in the abdomen or pelvis. Aortic Atherosclerosis (ICD10-I70.0). Electronically Signed   By: Valetta Mole M.D.   On: 12/30/2020 09:43       Subjective: No concerns this morning.  Discharge Exam: Vitals:   01/04/21 2110 01/05/21 0424  BP: (!) 141/91 111/68  Pulse: 87 72  Resp: 20 20  Temp: 98.3 F (36.8 C) 98.2 F (36.8 C)  SpO2: 100% 99%   Vitals:   01/04/21 0800 01/04/21 1423 01/04/21 2110 01/05/21 0424  BP: 114/83 123/84 (!) 141/91 111/68  Pulse: 74 86 87 72  Resp: 18 18 20 20   Temp: 98.8 F (37.1 C) 98.6 F (37 C) 98.3 F (36.8 C) 98.2 F (36.8 C)  TempSrc: Oral Oral Oral Oral  SpO2: 98% 99% 100% 99%  Weight:        General: Pt is alert, awake, not in acute distress   The results of significant diagnostics from this hospitalization (including imaging, microbiology, ancillary and laboratory) are listed below for reference.     Microbiology: Recent Results (from the past 240 hour(s))  Gastrointestinal Panel by PCR , Stool     Status: None   Collection Time: 12/31/20  5:23 AM   Specimen: Stool  Result Value Ref Range Status   Campylobacter species NOT DETECTED NOT DETECTED Final   Plesimonas shigelloides NOT DETECTED NOT DETECTED Final   Salmonella species NOT DETECTED NOT DETECTED Final   Yersinia enterocolitica NOT DETECTED NOT DETECTED Final   Vibrio species NOT DETECTED NOT DETECTED Final   Vibrio cholerae NOT DETECTED  NOT DETECTED Final   Enteroaggregative E coli (EAEC) NOT DETECTED NOT DETECTED Final   Enteropathogenic E coli (EPEC) NOT DETECTED NOT DETECTED Final   Enterotoxigenic E coli (ETEC) NOT DETECTED NOT DETECTED Final   Shiga like toxin producing E coli (STEC) NOT DETECTED NOT DETECTED Final   Shigella/Enteroinvasive E coli (EIEC) NOT DETECTED NOT DETECTED Final   Cryptosporidium NOT DETECTED NOT DETECTED Final   Cyclospora cayetanensis NOT DETECTED NOT DETECTED Final   Entamoeba histolytica NOT DETECTED NOT DETECTED Final   Giardia lamblia NOT DETECTED NOT DETECTED Final   Adenovirus F40/41 NOT DETECTED NOT DETECTED Final   Astrovirus NOT DETECTED NOT DETECTED Final   Norovirus GI/GII NOT DETECTED NOT DETECTED Final   Rotavirus A NOT DETECTED NOT DETECTED Final   Sapovirus (I, II, IV, and V) NOT DETECTED NOT DETECTED Final    Comment: Performed at [facility], [address] [place], [place] [address]  Resp Panel by RT-PCR (Flu A&B, Covid) Nasopharyngeal Swab     Status: None   Collection Time: 12/31/20  6:20 PM   Specimen: Nasopharyngeal Swab; Nasopharyngeal(NP) swabs in vial transport medium  Result Value Ref Range Status   SARS Coronavirus  2 by RT PCR NEGATIVE NEGATIVE Final    Comment: (NOTE) SARS-CoV-2 target nucleic acids are NOT DETECTED.  The SARS-CoV-2 RNA is generally detectable in upper respiratory specimens during the acute phase of infection. The lowest concentration of SARS-CoV-2 viral copies this assay can detect is 138 copies/mL. A negative result does not preclude SARS-Cov-2 infection and should not be used as the sole basis for treatment or other patient management decisions. A negative result may occur with  improper specimen collection/handling, submission of specimen other than nasopharyngeal swab, presence of viral mutation(s) within the areas targeted by this assay, and inadequate number of viral copies(<138 copies/mL). A negative result must be  combined with clinical observations, patient history, and epidemiological information. The expected result is Negative.  Fact Sheet for Patients:  EntrepreneurPulse.com.au  Fact Sheet for Healthcare Providers:  IncredibleEmployment.be  This test is no t yet approved or cleared by the [place] FDA and  has been authorized for detection and/or diagnosis of SARS-CoV-2 by FDA under an Emergency Use Authorization (EUA). This EUA will remain  in effect (meaning this test can be used) for the duration of the COVID-19 declaration under Section 564(b)(1) of the Act, 21 U.S.C.section 360bbb-3(b)(1), unless the authorization is terminated  or revoked sooner.       Influenza A by PCR NEGATIVE NEGATIVE Final   Influenza B by PCR NEGATIVE NEGATIVE Final    Comment: (NOTE) The Xpert Xpress SARS-CoV-2/FLU/RSV plus assay is intended as an aid in the diagnosis of influenza from Nasopharyngeal swab specimens and should not be used as a sole basis for treatment. Nasal washings and aspirates are unacceptable for Xpert Xpress SARS-CoV-2/FLU/RSV testing.  Fact Sheet for Patients: EntrepreneurPulse.com.au  Fact Sheet for Healthcare Providers: IncredibleEmployment.be  This test is not yet approved or cleared by the [place] FDA and has been authorized for detection and/or diagnosis of SARS-CoV-2 by FDA under an Emergency Use Authorization (EUA). This EUA will remain in effect (meaning this test can be used) for the duration of the COVID-19 declaration under Section 564(b)(1) of the Act, 21 U.S.C. section 360bbb-3(b)(1), unless the authorization is terminated or revoked.  Performed at [facility], [place] [address], [place], [place] [address]      Labs: BNP (last 3 results) No results for input(s): BNP in the last 8760 hours. Basic Metabolic Panel: Recent Labs  Lab 12/30/20 0732 12/31/20 0505  01/01/21 0548 01/02/21 0527 01/03/21 0511 01/04/21 0553  NA 134* 134* 132* 133* 133* 135  K 3.0* 3.1* 3.2* 3.8 3.6 3.9  CL 98 100 101 101 102 102  CO2 26 24 27 27 27 29   GLUCOSE 122* 116* 98 119* 102* 107*  BUN 11 11 9  7* 6* 6*  CREATININE 0.97 0.80 0.73 0.62 0.87 0.74  CALCIUM 7.9* 7.4* 7.2* 7.1* 7.0* 7.0*  MG 1.9 1.8  --   --   --   --    Liver Function Tests: Recent Labs  Lab 12/30/20 0732 12/31/20 0505 01/03/21 0511  AST 24 23 20   ALT 23 23 17   ALKPHOS 44 41 46  BILITOT 1.3* 1.3* 0.6  PROT 5.1* 4.6* 4.0*  ALBUMIN 2.5* 2.1* 1.8*   Recent Labs  Lab 12/30/20 0732  LIPASE 43   No results for input(s): AMMONIA in the last 168 hours. CBC: Recent Labs  Lab 12/30/20 0732 12/31/20 0505 01/03/21 0511 01/04/21 0553  WBC 5.2 7.4 5.8 4.9  NEUTROABS 3.6  --   --   --  HGB 14.1 13.2 11.6* 11.2*  HCT 41.1 39.0 35.9* 34.6*  MCV 80.1 80.6 84.1 83.6  PLT 247 240 278 221   Cardiac Enzymes: No results for input(s): CKTOTAL, CKMB, CKMBINDEX, TROPONINI in the last 168 hours. BNP: Invalid input(s): POCBNP CBG: No results for input(s): GLUCAP in the last 168 hours. D-Dimer No results for input(s): DDIMER in the last 72 hours. Hgb A1c No results for input(s): HGBA1C in the last 72 hours. Lipid Profile No results for input(s): CHOL, HDL, LDLCALC, TRIG, CHOLHDL, LDLDIRECT in the last 72 hours. Thyroid function studies No results for input(s): TSH, T4TOTAL, T3FREE, THYROIDAB in the last 72 hours.  Invalid input(s): FREET3 Anemia work up No results for input(s): VITAMINB12, FOLATE, FERRITIN, TIBC, IRON, RETICCTPCT in the last 72 hours. Urinalysis    Component Value Date/Time   LABSPEC 1.030 05/22/2018 1133   BILIRUBINUR negative 05/22/2018 1133   BILIRUBINUR neg 02/25/2016 0958   KETONESUR negative 05/22/2018 1133   PROTEINUR negative 05/22/2018 1133   PROTEINUR neg 02/25/2016 0958   UROBILINOGEN negative 02/25/2016 0958   NITRITE Negative 05/22/2018 1133   NITRITE  neg 02/25/2016 0958   LEUKOCYTESUR Negative 05/22/2018 1133   Sepsis Labs Invalid input(s): PROCALCITONIN,  WBC,  LACTICIDVEN Microbiology Recent Results (from the past 240 hour(s))  Gastrointestinal Panel by PCR , Stool     Status: None   Collection Time: 12/31/20  5:23 AM   Specimen: Stool  Result Value Ref Range Status   Campylobacter species NOT DETECTED NOT DETECTED Final   Plesimonas shigelloides NOT DETECTED NOT DETECTED Final   Salmonella species NOT DETECTED NOT DETECTED Final   Yersinia enterocolitica NOT DETECTED NOT DETECTED Final   Vibrio species NOT DETECTED NOT DETECTED Final   Vibrio cholerae NOT DETECTED NOT DETECTED Final   Enteroaggregative E coli (EAEC) NOT DETECTED NOT DETECTED Final   Enteropathogenic E coli (EPEC) NOT DETECTED NOT DETECTED Final   Enterotoxigenic E coli (ETEC) NOT DETECTED NOT DETECTED Final   Shiga like toxin producing E coli (STEC) NOT DETECTED NOT DETECTED Final   Shigella/Enteroinvasive E coli (EIEC) NOT DETECTED NOT DETECTED Final   Cryptosporidium NOT DETECTED NOT DETECTED Final   Cyclospora cayetanensis NOT DETECTED NOT DETECTED Final   Entamoeba histolytica NOT DETECTED NOT DETECTED Final   Giardia lamblia NOT DETECTED NOT DETECTED Final   Adenovirus F40/41 NOT DETECTED NOT DETECTED Final   Astrovirus NOT DETECTED NOT DETECTED Final   Norovirus GI/GII NOT DETECTED NOT DETECTED Final   Rotavirus A NOT DETECTED NOT DETECTED Final   Sapovirus (I, II, IV, and V) NOT DETECTED NOT DETECTED Final    Comment: Performed at [facility], [address] [place], [place] [address]  Resp Panel by RT-PCR (Flu A&B, Covid) Nasopharyngeal Swab     Status: None   Collection Time: 12/31/20  6:20 PM   Specimen: Nasopharyngeal Swab; Nasopharyngeal(NP) swabs in vial transport medium  Result Value Ref Range Status   SARS Coronavirus 2 by RT PCR NEGATIVE NEGATIVE Final    Comment: (NOTE) SARS-CoV-2 target nucleic acids are NOT DETECTED.  The  SARS-CoV-2 RNA is generally detectable in upper respiratory specimens during the acute phase of infection. The lowest concentration of SARS-CoV-2 viral copies this assay can detect is 138 copies/mL. A negative result does not preclude SARS-Cov-2 infection and should not be used as the sole basis for treatment or other patient management decisions. A negative result may occur with  improper specimen collection/handling, submission of specimen other than nasopharyngeal swab, presence  of viral mutation(s) within the areas targeted by this assay, and inadequate number of viral copies(<138 copies/mL). A negative result must be combined with clinical observations, patient history, and epidemiological information. The expected result is Negative.  Fact Sheet for Patients:  EntrepreneurPulse.com.au  Fact Sheet for Healthcare Providers:  IncredibleEmployment.be  This test is no t yet approved or cleared by the [place] FDA and  has been authorized for detection and/or diagnosis of SARS-CoV-2 by FDA under an Emergency Use Authorization (EUA). This EUA will remain  in effect (meaning this test can be used) for the duration of the COVID-19 declaration under Section 564(b)(1) of the Act, 21 U.S.C.section 360bbb-3(b)(1), unless the authorization is terminated  or revoked sooner.       Influenza A by PCR NEGATIVE NEGATIVE Final   Influenza B by PCR NEGATIVE NEGATIVE Final    Comment: (NOTE) The Xpert Xpress SARS-CoV-2/FLU/RSV plus assay is intended as an aid in the diagnosis of influenza from Nasopharyngeal swab specimens and should not be used as a sole basis for treatment. Nasal washings and aspirates are unacceptable for Xpert Xpress SARS-CoV-2/FLU/RSV testing.  Fact Sheet for Patients: EntrepreneurPulse.com.au  Fact Sheet for Healthcare Providers: IncredibleEmployment.be  This test is not yet approved or  cleared by the [place] FDA and has been authorized for detection and/or diagnosis of SARS-CoV-2 by FDA under an Emergency Use Authorization (EUA). This EUA will remain in effect (meaning this test can be used) for the duration of the COVID-19 declaration under Section 564(b)(1) of the Act, 21 U.S.C. section 360bbb-3(b)(1), unless the authorization is terminated or revoked.  Performed at [facility], [place] [address] [place], [place] [address]      Time coordinating discharge: 35 minutes  SIGNED:   Cordelia Poche, MD [facility] 01/05/2021, 11:00 AM

## 2021-01-05 NOTE — Progress Notes (Signed)
Pt discharged home. AVS printed out and educational teaching completed with teach back method. Pt verbalized understanding of educational teaching. PIV removed. VSS. Pt has all belongings. No further questions at this time.

## 2021-01-05 NOTE — TOC Transition Note (Signed)
Transition of Care [facility]) - CM/SW Discharge Note   Patient Details  Name: Dylan Fischer MRN: [id number] Date of Birth: [birth year]  [facility] CM/SW Contact:  Trish Mage, LCSW Phone Number: 01/05/2021, 10:59 AM   Clinical Narrative:   Patient seen as he has no PCP currently.  [name]'s wife was in room with him; they assured me they are working on getting him set up with an in-network provider, will plan on completing that process when they get home.  He will see Dr Ammie Dalton, oncologist, within a week of d/c today.  [name] is retired, has transportation and the support of his wife, and has no financial worries. No further needs identified. TOC sign off.    Final next level of care: Home/Self Care Barriers to Discharge: No Barriers Identified   Patient Goals and CMS Choice        Discharge Placement                       Discharge Plan and Services                                     Social Determinants of Health (SDOH) Interventions     Readmission Risk Interventions Readmission Risk Prevention Plan 01/05/2021  Transportation Screening Complete  PCP or Specialist Appt within 3-5 Days Complete  HRI or [place] Complete  Social Work Consult for [place] Planning/Counseling Complete  Palliative Care Screening Not Applicable  Some recent data might be hidden

## 2021-01-05 NOTE — Progress Notes (Signed)
HEMATOLOGY-ONCOLOGY PROGRESS NOTE  SUBJECTIVE: [name] reports improvement in abdominal pain, nausea, and diarrhea.  He had 3 episodes of diarrhea yesterday.  He is tolerating a diet.  No emesis.  He is ambulating.  Oncology History  Colon cancer ([place])  11/11/2020 Initial Diagnosis   Colon cancer ([place])   11/25/2020 Cancer Staging   Staging form: Colon and Rectum, AJCC 8th Edition - Clinical: Stage IIB (cT4a, cN0, cM0) - Signed by Ladell Pier, MD on 11/25/2020 Total positive nodes: 0 Histologic grade (G): G3 Histologic grading system: 4 grade system    12/13/2020 - 12/13/2020 Chemotherapy   Patient is on Treatment Plan : COLORECTAL Xelox (Capeox) q21d     12/27/2020 Genetic Testing   Negative genetic testing.  RECQL [id number] Variant, Unknown Significance.  The report date is December 27, 2020.  The CancerNext-Expanded gene panel offered by [facility] and includes sequencing and rearrangement analysis for the following 77 genes: AIP, ALK, APC*, ATM*, AXIN2, BAP1, BARD1, BLM, BMPR1A, BRCA1*, BRCA2*, BRIP1*, CDC73, CDH1*, CDK4, CDKN1B, CDKN2A, CHEK2*, CTNNA1, DICER1, FANCC, FH, FLCN, GALNT12, KIF1B, LZTR1, MAX, MEN1, MET, MLH1*, MSH2*, MSH3, MSH6*, MUTYH*, NBN, NF1*, NF2, NTHL1, PALB2*, PHOX2B, PMS2*, POT1, PRKAR1A, PTCH1, PTEN*, RAD51C*, RAD51D*, RB1, RECQL, RET, SDHA, SDHAF2, SDHB, SDHC, SDHD, SMAD4, SMARCA4, SMARCB1, SMARCE1, STK11, SUFU, TMEM127, TP53*, TSC1, TSC2, VHL and XRCC2 (sequencing and deletion/duplication); EGFR, EGLN1, HOXB13, KIT, MITF, PDGFRA, POLD1, and POLE (sequencing only); EPCAM and GREM1 (deletion/duplication only). DNA and RNA analyses performed for * genes.    01/04/2021 -  Chemotherapy   Patient is on Treatment Plan : COLORECTAL FOLFOX q14d x 3 months      PHYSICAL EXAMINATION:  Vitals:   01/04/21 2110 01/05/21 0424  BP: (!) 141/91 111/68  Pulse: 87 72  Resp: 20 20  Temp: 98.3 F (36.8 C) 98.2 F (36.8 C)  SpO2: 100% 99%   Filed Weights    12/31/20 0500 12/31/20 1459  Weight: 217 lb 2.5 oz (98.5 kg) 222 lb 0.1 oz (100.7 kg)    Intake/Output from previous day: 10/25 0701 - 10/26 0700 In: 280 [P.O.:280] Out: -   GENERAL:alert, no distress and comfortable OROPHARYNX: No ulcers, mild thrush at the bilateral buccal mucosa ABDOMEN: Soft, no hepatosplenomegaly, mild diffuse tenderness Vascular: Trace pitting edema to lower leg bilaterally    LABORATORY DATA:  I have reviewed the data as listed CMP Latest Ref Rng & Units 01/04/2021 01/03/2021 01/02/2021  Glucose 70 - 99 mg/dL 107(H) 102(H) 119(H)  BUN 8 - 23 mg/dL 6(L) 6(L) 7(L)  Creatinine 0.61 - 1.24 mg/dL 0.74 0.87 0.62  Sodium 135 - 145 mmol/L 135 133(L) 133(L)  Potassium 3.5 - 5.1 mmol/L 3.9 3.6 3.8  Chloride 98 - 111 mmol/L 102 102 101  CO2 22 - 32 mmol/L '29 27 27  ' Calcium 8.9 - 10.3 mg/dL 7.0(L) 7.0(L) 7.1(L)  Total Protein 6.5 - 8.1 g/dL - 4.0(L) -  Total Bilirubin 0.3 - 1.2 mg/dL - 0.6 -  Alkaline Phos 38 - 126 U/L - 46 -  AST 15 - 41 U/L - 20 -  ALT 0 - 44 U/L - 17 -    Lab Results  Component Value Date   WBC 4.9 01/04/2021   HGB 11.2 (L) 01/04/2021   HCT 34.6 (L) 01/04/2021   MCV 83.6 01/04/2021   PLT 221 01/04/2021   NEUTROABS 3.6 12/30/2020    CT ABDOMEN PELVIS W CONTRAST  Result Date: 12/30/2020 CLINICAL DATA:  Acute abdominal pain. History of colon cancer, started  chemotherapy 2 weeks ago EXAM: CT ABDOMEN AND PELVIS WITH CONTRAST TECHNIQUE: Multidetector CT imaging of the abdomen and pelvis was performed using the standard protocol following bolus administration of intravenous contrast. CONTRAST:  75m OMNIPAQUE IOHEXOL 350 MG/ML SOLN COMPARISON:  CT abdomen/pelvis 10/15/2020 FINDINGS: Lower chest: There is minimal bibasilar subsegmental atelectasis. The imaged heart is unremarkable. Hepatobiliary: A subcentimeter hypodense lesion in the right hepatic lobe near the dome is unchanged, too small to characterize. The liver is otherwise unremarkable.  The gallbladder is unremarkable. There is no biliary ductal dilatation. Pancreas: Unremarkable. Spleen: Unremarkable. Adrenals/Urinary Tract: The adrenals are unremarkable. Subcentimeter hypodense lesions in the kidneys are unchanged, again too small to characterize but likely reflect small cysts. The kidneys are otherwise unremarkable, with no other focal lesions. There are no stones. There is no hydronephrosis or hydroureter. Stomach/Bowel: The stomach is unremarkable. There has been interval partial colectomy for colonic malignancy resection. There is no evidence of complication at the anastomotic site. There is marked wall thickening involving the distal small bowel with associated mucosal hyperemia and surrounding fat stranding consistent with infectious or inflammatory enteritis. There is no pneumatosis intestinalis. There is no evidence of bowel obstruction. Vascular/Lymphatic: There is scattered calcified atherosclerotic plaque throughout the nonaneurysmal abdominal aorta. The major branch vessels are patent. The main portal and splenic veins are patent. There is no portal venous gas. Scattered subcentimeter periportal lymph nodes measuring up to 6-7 mm are unchanged. There is no new or progressive lymphadenopathy in the abdomen or pelvis. Reproductive: The prostate and seminal vesicles are unremarkable. Other: There is small volume free fluid in the pelvis, likely reactive. There is no free intraperitoneal air. Musculoskeletal: There is no acute osseous abnormality or aggressive osseous lesion. IMPRESSION: 1. Marked wall thickening and mucosal hyperemia in the distal small bowel consistent with nonspecific infectious/inflammatory enteritis. Differential would include medication side effect given recent initiation of chemotherapy. 2. Interval partial colectomy for colonic malignancy resection without evidence of complication at the anastomotic site. 3. Scattered subcentimeter upper abdominal lymph nodes are  unchanged. No new or progressive lymphadenopathy in the abdomen or pelvis. Aortic Atherosclerosis (ICD10-I70.0). Electronically Signed   By: PValetta MoleM.D.   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, MSI-high, loss of MLH1 and PMS2 expression, negative for BRAF V600E mutation, MLH1 hypermethylation absent  colonoscopy 10/05/2020-near completely obstructing mass at the hepatic flexure-could not be passed, polyps in the sigmoid and transverse colon CTs 10/15/2020-short segment wall thickening with adjacent inflammatory stranding at the hepatic flexure, prominent mesenteric nodes adjacent to the colonic neoplasm measuring up to 5 mm, prominent gastropathic, hepatoduodenal ligament, and right sided retroperitoneal nodes measured up to 7 mm, subcentimeter hypodense hypodensities-too small to characterize, tiny right middle lobe nodule 12/13/2020-ctDNA not detected Cycle 1 CAPOX 12/13/2020, Xeloda discontinued around 12/24/2020   Tubular adenoma on the colonoscopy 10/05/2020 and [facility] colonoscopy [facility]  Mr. DKrasowskiwas admitted with chemotherapy induced enteritis.  His symptoms are slowly improving.  He is tolerating a diet and ambulating.  He appears stable for discharge from an oncology standpoint.    Recommendations: 1.  He can be discharged home if he continues to tolerate a diet 2.  Continue oral antiemetics and Imodium as needed 3.  Wean the hydromorphone to off 4.  Outpatient follow-up will be scheduled at the Cancer center for the week of 01/10/2021    Future Appointments  Date Time Provider [place]  01/24/2021 10:15 AM [facility] PHLEBOTOMIST [facility] None  01/24/2021 10:45 AM  Owens Shark, NP [facility] None  01/24/2021 11:15 AM [facility] CHAIR 1 [facility] None  02/14/2021 10:00 AM [facility] PHLEBOTOMIST [facility] None  02/14/2021 10:40 AM Ladell Pier, MD [facility] None  02/14/2021 11:00 AM [facility] CHAIR 1 [facility] None  08/23/2021  8:30 AM Denita Lung, MD [facility] [place]      LOS: 5 days   Betsy Coder, MD 01/05/21

## 2021-01-07 ENCOUNTER — Telehealth: Payer: Self-pay | Admitting: *Deleted

## 2021-01-07 ENCOUNTER — Telehealth: Payer: Self-pay | Admitting: Oncology

## 2021-01-07 LAB — DPD 5-FLUOROURACIL TOXICITY

## 2021-01-07 NOTE — Telephone Encounter (Signed)
[facility] Discharge Call Note: Spoke w/patient and confirmed his appointment with Dr. Benay Spice on 11/3 at 10:40. Has no transportation issues. Has been able to eat ~ 5 small meals/day. Has not ordered the [facility] supplement--has done well with almond mild w/[employer]. Nausea controlled w/meds as well as pain--has ranged "3-4/10". Bowels are moving well--frequent, but not diarrhea. His left arm where IV was is still swollen and he is asking how long it will be swollen?. Informed him it can take several days and to elevate arm as much as possible. Call if he has fever, increased swelling or any redness.

## 2021-01-07 NOTE — Telephone Encounter (Signed)
Called patient per 10/28 sch msg - left message for patient to call back to schedule nutrition appt.    Scheduling Message Entered by NEFF, BARBARA L on 01/07/2021 at 12:40 PM Priority: Routine NUT 51  Department: [facility]  Provider:   Scheduling Notes:  Please schedule patient for nutrition appointment. Offer in person vs telephone.  Thanks!!  Barb

## 2021-01-10 ENCOUNTER — Telehealth: Payer: Self-pay

## 2021-01-10 NOTE — Telephone Encounter (Signed)
Transition Care Management Unsuccessful Follow-up Telephone Call  Date of discharge and from where:  01/05/2021  [facility]  Attempts:  1st Attempt  Reason for unsuccessful TCM follow-up call:  No answer/busy  Tomasa Rand, RN, BSN, CEN [place] Coordinator [phone]

## 2021-01-11 ENCOUNTER — Telehealth: Payer: Self-pay

## 2021-01-11 ENCOUNTER — Encounter: Payer: Self-pay | Admitting: *Deleted

## 2021-01-11 NOTE — Telephone Encounter (Signed)
Transition Care Management Follow-up Telephone Call Date of discharge and from where: 01/05/21 from [facility] How have you been since you were released from the hospital? "Trying to get strength back" Any questions or concerns? No  Items Reviewed: Did the pt receive and understand the discharge instructions provided? Yes  Medications obtained and verified? Yes  Other? No  Any new allergies since your discharge? No  Dietary orders reviewed? Yes Do you have support at home? Yes   Home Care and Equipment/Supplies: Were home health services ordered? no If so, what is the name of the agency? Not applicable  Has the agency set up a time to come to the patient's home? not applicable Were any new equipment or medical supplies ordered?  No What is the name of the medical supply agency? Not applicable Were you able to get the supplies/equipment? not applicable Do you have any questions related to the use of the equipment or supplies? No  Functional Questionnaire: (I = Independent and D = Dependent) ADLs: I  Bathing/Dressing- I  Meal Prep- I  Eating- I  Maintaining continence- I  Transferring/Ambulation- I  Managing Meds- I  Follow up appointments reviewed:  PCP Hospital f/u appt confirmed?  Following up with specialist.  Scheduled to see Dr. Redmond School on 08/23/21 @ 8:30am. [facility] f/u appt confirmed? Yes  Scheduled to see Ernestene Kiel, RD on 01/12/21 at 10:30 am and Dr. Betsy Coder on 01/13/21 @ 10:40am. Are transportation arrangements needed? No  If their condition worsens, is the pt aware to call PCP or go to the Emergency Dept.? Yes Was the patient provided with contact information for the PCP's office or ED? Yes Was to pt encouraged to call back with questions or concerns? Yes    Thea Silversmith, RN, MSN, BSN, [place] Care Management Coordinator [phone]

## 2021-01-11 NOTE — Progress Notes (Unsigned)
Insurance requesting office notes, pathology and records to support medical necessity of Guardant Reveal testing. Faxed requested records to [place]

## 2021-01-12 ENCOUNTER — Telehealth: Payer: Self-pay | Admitting: Nutrition

## 2021-01-12 ENCOUNTER — Inpatient Hospital Stay: Payer: 59 | Attending: Nurse Practitioner | Admitting: Nutrition

## 2021-01-12 DIAGNOSIS — Z5111 Encounter for antineoplastic chemotherapy: Secondary | ICD-10-CM | POA: Insufficient documentation

## 2021-01-12 DIAGNOSIS — Z79899 Other long term (current) drug therapy: Secondary | ICD-10-CM | POA: Insufficient documentation

## 2021-01-12 DIAGNOSIS — C184 Malignant neoplasm of transverse colon: Secondary | ICD-10-CM | POA: Insufficient documentation

## 2021-01-12 NOTE — Telephone Encounter (Signed)
Nutrition follow up by telephone.  Patient had complete nutrition assessment during hospitalization. He is a 60 year old male diagnosed with Colon Cancer receiving CAPOX who developed nausea and vomiting and Intractable diarrhea and abdominal pain.   UBW noted of 230 pounds. Patient weighed 222 pounds on October 19.  Labs reviewed and noted Glucose 107, BUN 6.  Diagnosed with Moderate Malnutrition during hospital stay.  Patient reports he has followed a low fiber diet and has seen improvement in his diarrhea. He has eliminated milk and is using Almond milk. He tolerates small amounts of cheese and ice cream. He is drinking one serving of [employer] daily mixed with almond milk. He is walking every day and reports his strength is returning. He continues MVI. Reports looking forward to advancing diet to include more vegetables. Nausea resolved.  Educated to try High Protein Almond Milk or Fairlife Milk (Ultra processed, low lactose) mixed with [employer]. Encouraged small frequent meals with adequate calories to maintain wt. Continue activity to support increased strength. Reviewed strategies for taking nausea medications after next treatment. Contact provider as needed. Encouraged patient to contact RD as needed.

## 2021-01-13 ENCOUNTER — Inpatient Hospital Stay (HOSPITAL_BASED_OUTPATIENT_CLINIC_OR_DEPARTMENT_OTHER): Payer: 59 | Admitting: Oncology

## 2021-01-13 ENCOUNTER — Other Ambulatory Visit: Payer: Self-pay

## 2021-01-13 VITALS — BP 90/68 | HR 78 | Temp 98.5°F | Resp 18 | Wt 203.2 lb

## 2021-01-13 DIAGNOSIS — C189 Malignant neoplasm of colon, unspecified: Secondary | ICD-10-CM | POA: Diagnosis not present

## 2021-01-13 DIAGNOSIS — Z5111 Encounter for antineoplastic chemotherapy: Secondary | ICD-10-CM | POA: Diagnosis not present

## 2021-01-13 DIAGNOSIS — Z79899 Other long term (current) drug therapy: Secondary | ICD-10-CM | POA: Diagnosis not present

## 2021-01-13 DIAGNOSIS — C184 Malignant neoplasm of transverse colon: Secondary | ICD-10-CM | POA: Diagnosis present

## 2021-01-13 NOTE — Progress Notes (Signed)
[place] OFFICE PROGRESS NOTE   Diagnosis: Colon cancer  INTERVAL HISTORY:   [name] was admitted following first cycle of CAPOX with nausea/vomiting and diarrhea.  He was discharged to home 01/05/2021.  He reports the diarrhea has resolved.  Nausea and abdominal pain are improved.  He is tolerating a diet.  He has mild erythema at the soles.  He continues to have swelling at the left greater than right lower leg.  He had an IV infiltration at the left arm while in the hospital.  The left arm remains mildly swollen. He had cold sensitivity following chemotherapy.  No neuropathy symptoms at present. Objective:  Vital signs in last 24 hours:  Pulse 78, temperature 98.5 F (36.9 C), resp. rate 18, weight 203 lb 3.2 oz (92.2 kg), SpO2 99 %.    HEENT: No thrush or ulcers Resp: Lungs clear bilaterally Cardio: Regular rate and rhythm GI: No hepatomegaly, nontender Vascular: Trace pitting edema at the lower leg on the left greater than right  Skin: Palms without erythema.  Mild erythema at the soles.  Portacath/PICC-without erythema  Lab Results:  Lab Results  Component Value Date   WBC 4.9 01/04/2021   HGB 11.2 (L) 01/04/2021   HCT 34.6 (L) 01/04/2021   MCV 83.6 01/04/2021   PLT 221 01/04/2021   NEUTROABS 3.6 12/30/2020    CMP  Lab Results  Component Value Date   NA 135 01/04/2021   K 3.9 01/04/2021   CL 102 01/04/2021   CO2 29 01/04/2021   GLUCOSE 107 (H) 01/04/2021   BUN 6 (L) 01/04/2021   CREATININE 0.74 01/04/2021   CALCIUM 7.0 (L) 01/04/2021   PROT 4.0 (L) 01/03/2021   ALBUMIN 1.8 (L) 01/03/2021   AST 20 01/03/2021   ALT 17 01/03/2021   ALKPHOS 46 01/03/2021   BILITOT 0.6 01/03/2021   GFRNONAA >60 01/04/2021   GFRAA 101 06/26/2019    Lab Results  Component Value Date   CEA 0.5 10/05/2020    Lab Results  Component Value Date   INR 1.1 (H) 10/05/2020   LABPROT 12.8 10/05/2020    Imaging:  No results found.  Medications: I  have reviewed the patient's current medications.   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, MSI-high, loss of MLH1 and PMS2 expression, negative for BRAF V600E mutation, MLH1 hypermethylation absent  Negative CancerNext panel-VUS in the [facility] gene colonoscopy 10/05/2020-near completely obstructing mass at the hepatic flexure-could not be passed, polyps in the sigmoid and transverse colon CTs 10/15/2020-short segment wall thickening with adjacent inflammatory stranding at the hepatic flexure, prominent mesenteric nodes adjacent to the colonic neoplasm measuring up to 5 mm, prominent gastropathic, hepatoduodenal ligament, and right sided retroperitoneal nodes measured up to 7 mm, subcentimeter hypodense hypodensities-too small to characterize, tiny right middle lobe nodule 12/13/2020-ctDNA not detected Cycle 1 CAPOX 12/13/2020, Xeloda discontinued around 12/24/2020   Tubular adenoma on the colonoscopy 10/05/2020 and on colonoscopy 06/14/2010 Hypertension [facility] admission 12/30/2020-acute gastroenteritis/colitis Negative for the IVS14+ DPYD mutation   Disposition: [name] completed cycle 1 CAPOX.  The chemotherapy was complicated by nausea/vomiting, diarrhea, and abdominal pain requiring hospital admission.  He has recovered admission with chemo induced nausea and enteritis.  He had severe symptoms for several weeks following chemotherapy.  He will not be rechallenged with the CAPOX regimen.  Genetic testing  returned negative for Lynch syndrome.  He does not appear to have DPD deficiency.  I recommend a trial of FOLFOX chemotherapy.  He understands the potential for recurrent nausea, mucositis, and diarrhea with FOLFOX.  We will add Emend as a premedication and the chemotherapy will be dose  attenuated for at least the first cycle.  [name] will return for an office visit and cycle 1 FOLFOX on 01/24/2021.  Betsy Coder, MD  01/13/2021  4:51 PM

## 2021-01-17 ENCOUNTER — Encounter: Payer: Self-pay | Admitting: Oncology

## 2021-01-18 ENCOUNTER — Encounter: Payer: Self-pay | Admitting: Oncology

## 2021-01-21 NOTE — Progress Notes (Signed)
Pharmacist Chemotherapy Monitoring - Initial Assessment    Anticipated start date: 01/24/21   The following has been reviewed per standard work regarding the patient's treatment regimen: The patient's diagnosis, treatment plan and drug doses, and organ/hematologic function Lab orders and baseline tests specific to treatment regimen  The treatment plan start date, drug sequencing, and pre-medications Prior authorization status  Patient's documented medication list, including drug-drug interaction screen and prescriptions for anti-emetics and supportive care specific to the treatment regimen The drug concentrations, fluid compatibility, administration routes, and timing of the medications to be used The patient's access for treatment and lifetime cumulative dose history, if applicable  The patient's medication allergies and previous infusion related reactions, if applicable   Changes made to treatment plan:  N/A  Follow up needed:  N/A   Patrica Duel, [facility], 01/21/2021  3:43 PM

## 2021-01-23 ENCOUNTER — Other Ambulatory Visit: Payer: Self-pay | Admitting: Oncology

## 2021-01-24 ENCOUNTER — Inpatient Hospital Stay: Payer: 59

## 2021-01-24 ENCOUNTER — Other Ambulatory Visit: Payer: 59

## 2021-01-24 ENCOUNTER — Other Ambulatory Visit: Payer: Self-pay

## 2021-01-24 ENCOUNTER — Encounter: Payer: Self-pay | Admitting: Nurse Practitioner

## 2021-01-24 ENCOUNTER — Inpatient Hospital Stay (HOSPITAL_BASED_OUTPATIENT_CLINIC_OR_DEPARTMENT_OTHER): Payer: 59 | Admitting: Nurse Practitioner

## 2021-01-24 ENCOUNTER — Ambulatory Visit (HOSPITAL_COMMUNITY)
Admission: RE | Admit: 2021-01-24 | Discharge: 2021-01-24 | Disposition: A | Discharge status: Home / Self Care | Payer: 59 | Source: Ambulatory Visit | Attending: Oncology | Admitting: Oncology

## 2021-01-24 ENCOUNTER — Other Ambulatory Visit: Payer: Self-pay | Admitting: Oncology

## 2021-01-24 VITALS — BP 123/80 | HR 63 | Temp 97.8°F | Resp 20 | Ht 76.0 in | Wt 210.8 lb

## 2021-01-24 DIAGNOSIS — C189 Malignant neoplasm of colon, unspecified: Secondary | ICD-10-CM | POA: Diagnosis present

## 2021-01-24 DIAGNOSIS — C184 Malignant neoplasm of transverse colon: Secondary | ICD-10-CM

## 2021-01-24 LAB — CBC WITH DIFFERENTIAL (CANCER CENTER ONLY)
Abs Immature Granulocytes: 0.02 10*3/uL (ref 0.00–0.07)
Basophils Absolute: 0 10*3/uL (ref 0.0–0.1)
Basophils Relative: 1 %
Eosinophils Absolute: 0.1 10*3/uL (ref 0.0–0.5)
Eosinophils Relative: 4 %
HCT: 39.8 % (ref 39.0–52.0)
Hemoglobin: 12.5 g/dL — ABNORMAL LOW (ref 13.0–17.0)
Immature Granulocytes: 1 %
Lymphocytes Relative: 20 %
Lymphs Abs: 0.8 10*3/uL (ref 0.7–4.0)
MCH: 27.4 pg (ref 26.0–34.0)
MCHC: 31.4 g/dL (ref 30.0–36.0)
MCV: 87.1 fL (ref 80.0–100.0)
Monocytes Absolute: 0.4 10*3/uL (ref 0.1–1.0)
Monocytes Relative: 10 %
Neutro Abs: 2.5 10*3/uL (ref 1.7–7.7)
Neutrophils Relative %: 64 %
Platelet Count: 242 10*3/uL (ref 150–400)
RBC: 4.57 MIL/uL (ref 4.22–5.81)
RDW: 17.9 % — ABNORMAL HIGH (ref 11.5–15.5)
WBC Count: 3.8 10*3/uL — ABNORMAL LOW (ref 4.0–10.5)
nRBC: 0 % (ref 0.0–0.2)

## 2021-01-24 LAB — CMP (CANCER CENTER ONLY)
ALT: 21 U/L (ref 0–44)
AST: 22 U/L (ref 15–41)
Albumin: 3.8 g/dL (ref 3.5–5.0)
Alkaline Phosphatase: 50 U/L (ref 38–126)
Anion gap: 7 (ref 5–15)
BUN: 10 mg/dL (ref 8–23)
CO2: 28 mmol/L (ref 22–32)
Calcium: 8.9 mg/dL (ref 8.9–10.3)
Chloride: 107 mmol/L (ref 98–111)
Creatinine: 0.78 mg/dL (ref 0.61–1.24)
GFR, Estimated: >60 mL/min (ref >60)
Glucose, Bld: 137 mg/dL — ABNORMAL HIGH (ref 70–99)
Potassium: 3.7 mmol/L (ref 3.5–5.1)
Sodium: 142 mmol/L (ref 135–145)
Total Bilirubin: 0.7 mg/dL (ref 0.3–1.2)
Total Protein: 5.8 g/dL — ABNORMAL LOW (ref 6.5–8.1)

## 2021-01-24 LAB — MAGNESIUM: Magnesium: 1.9 mg/dL (ref 1.7–2.4)

## 2021-01-24 MED ORDER — SODIUM CHLORIDE 0.9 % IV SOLN
150.0000 mg | Freq: Once | INTRAVENOUS | Status: AC
  Administered 2021-01-24: 150 mg via INTRAVENOUS
  Filled 2021-01-24: qty 5

## 2021-01-24 MED ORDER — LIDOCAINE HCL 1 % IJ SOLN
INTRAMUSCULAR | Status: AC
  Filled 2021-01-24: qty 20

## 2021-01-24 MED ORDER — LEUCOVORIN CALCIUM INJECTION 350 MG
200.0000 mg/m2 | Freq: Once | INTRAVENOUS | Status: AC
  Administered 2021-01-24: 444 mg via INTRAVENOUS
  Filled 2021-01-24: qty 22.2

## 2021-01-24 MED ORDER — SODIUM CHLORIDE 0.9 % IV SOLN
1800.0000 mg/m2 | INTRAVENOUS | Status: DC
  Administered 2021-01-24: 4000 mg via INTRAVENOUS
  Filled 2021-01-24: qty 80

## 2021-01-24 MED ORDER — LIDOCAINE HCL 1 % IJ SOLN
INTRAMUSCULAR | Status: DC | PRN
  Administered 2021-01-24: 5 mL

## 2021-01-24 MED ORDER — OXALIPLATIN CHEMO INJECTION 100 MG/20ML
65.0000 mg/m2 | Freq: Once | INTRAVENOUS | Status: AC
  Administered 2021-01-24: 145 mg via INTRAVENOUS
  Filled 2021-01-24: qty 20

## 2021-01-24 MED ORDER — SODIUM CHLORIDE 0.9 % IV SOLN
10.0000 mg | Freq: Once | INTRAVENOUS | Status: AC
  Administered 2021-01-24: 10 mg via INTRAVENOUS
  Filled 2021-01-24: qty 1

## 2021-01-24 MED ORDER — PALONOSETRON HCL INJECTION 0.25 MG/5ML
0.2500 mg | Freq: Once | INTRAVENOUS | Status: AC
  Administered 2021-01-24: 0.25 mg via INTRAVENOUS
  Filled 2021-01-24: qty 5

## 2021-01-24 MED ORDER — DEXTROSE 5 % IV SOLN: Freq: Once | INTRAVENOUS | Status: AC

## 2021-01-24 MED ORDER — FLUOROURACIL CHEMO INJECTION 500 MG/10ML
200.0000 mg/m2 | Freq: Once | INTRAVENOUS | Status: AC
  Administered 2021-01-24: 450 mg via INTRAVENOUS
  Filled 2021-01-24: qty 9

## 2021-01-24 NOTE — Patient Instructions (Addendum)
[facility]  Discharge Instructions: Thank you for choosing [facility] to provide your oncology and hematology care.   If you have a lab appointment with the [facility], please go directly to the [facility] and check in at the registration area.   Wear comfortable clothing and clothing appropriate for easy access to any Portacath or PICC line.   We strive to give you quality time with your provider. You may need to reschedule your appointment if you arrive late (15 or more minutes).  Arriving late affects you and other patients whose appointments are after yours.  Also, if you miss three or more appointments without notifying the office, you may be dismissed from the clinic at the provider's discretion.      For prescription refill requests, have your pharmacy contact our office and allow 72 hours for refills to be completed.    Today you received the following chemotherapy and/or immunotherapy agents: oxaliplatin, leucovorin, fluorouracil.       To help prevent nausea and vomiting after your treatment, we encourage you to take your nausea medication as directed.  BELOW ARE SYMPTOMS THAT SHOULD BE REPORTED IMMEDIATELY: *FEVER GREATER THAN 100.4 F (38 C) OR HIGHER *CHILLS OR SWEATING *NAUSEA AND VOMITING THAT IS NOT CONTROLLED WITH YOUR NAUSEA MEDICATION *UNUSUAL SHORTNESS OF BREATH *UNUSUAL BRUISING OR BLEEDING *URINARY PROBLEMS (pain or burning when urinating, or frequent urination) *BOWEL PROBLEMS (unusual diarrhea, constipation, pain near the anus) TENDERNESS IN MOUTH AND THROAT WITH OR WITHOUT PRESENCE OF ULCERS (sore throat, sores in mouth, or a toothache) UNUSUAL RASH, SWELLING OR PAIN  UNUSUAL VAGINAL DISCHARGE OR ITCHING   Items with * indicate a potential emergency and should be followed up as soon as possible or go to the Emergency Department if any problems should occur.  Please show the CHEMOTHERAPY ALERT CARD or IMMUNOTHERAPY  ALERT CARD at check-in to the Emergency Department and triage nurse.  Should you have questions after your visit or need to cancel or reschedule your appointment, please contact [facility]  Dept: [phone]  and follow the prompts.  Office hours are 8:00 a.m. to 4:30 p.m. Monday - Friday. Please note that voicemails left after 4:00 p.m. may not be returned until the following business day.  We are closed weekends and major holidays. You have access to a nurse at all times for urgent questions. Please call the main number to the clinic Dept: [phone] and follow the prompts.   For any non-urgent questions, you may also contact your provider using MyChart. We now offer e-Visits for anyone 37 and older to request care online for non-urgent symptoms. For details visit [email or web address].   Also download the MyChart app! Go to the app store, search "MyChart", open the app, select [facility], and log in with your MyChart username and password.  Due to Covid, a mask is required upon entering the hospital/clinic. If you do not have a mask, one will be given to you upon arrival. For doctor visits, patients may have 1 support person aged 41 or older with them. For treatment visits, patients cannot have anyone with them due to current Covid guidelines and our immunocompromised population.   Oxaliplatin Injection What is this medication? OXALIPLATIN (ox AL i PLA tin) is a chemotherapy drug. It targets fast dividing cells, like cancer cells, and causes these cells to die. This medicine is used to treat cancers of the colon and rectum, and many other cancers.  This medicine may be used for other purposes; ask your health care provider or pharmacist if you have questions. COMMON BRAND NAME(S): Eloxatin What should I tell my care team before I take this medication? They need to know if you have any of these conditions: heart disease history of irregular heartbeat liver  disease low blood counts, like white cells, platelets, or red blood cells lung or breathing disease, like asthma take medicines that treat or prevent blood clots tingling of the fingers or toes, or other nerve disorder an unusual or allergic reaction to oxaliplatin, other chemotherapy, other medicines, foods, dyes, or preservatives pregnant or trying to get pregnant breast-feeding How should I use this medication? This drug is given as an infusion into a vein. It is administered in a hospital or clinic by a specially trained health care professional. Talk to your pediatrician regarding the use of this medicine in children. Special care may be needed. Overdosage: If you think you have taken too much of this medicine contact a poison control center or emergency room at once. NOTE: This medicine is only for you. Do not share this medicine with others. What if I miss a dose? It is important not to miss a dose. Call your doctor or health care professional if you are unable to keep an appointment. What may interact with this medication? Do not take this medicine with any of the following medications: cisapride dronedarone pimozide thioridazine This medicine may also interact with the following medications: aspirin and aspirin-like medicines certain medicines that treat or prevent blood clots like warfarin, apixaban, dabigatran, and rivaroxaban cisplatin cyclosporine diuretics medicines for infection like acyclovir, adefovir, amphotericin B, bacitracin, cidofovir, foscarnet, ganciclovir, gentamicin, pentamidine, vancomycin NSAIDs, medicines for pain and inflammation, like ibuprofen or naproxen other medicines that prolong the QT interval (an abnormal heart rhythm) pamidronate zoledronic acid This list may not describe all possible interactions. Give your health care provider a list of all the medicines, herbs, non-prescription drugs, or dietary supplements you use. Also tell them if you  smoke, drink alcohol, or use illegal drugs. Some items may interact with your medicine. What should I watch for while using this medication? Your condition will be monitored carefully while you are receiving this medicine. You may need blood work done while you are taking this medicine. This medicine may make you feel generally unwell. This is not uncommon as chemotherapy can affect healthy cells as well as cancer cells. Report any side effects. Continue your course of treatment even though you feel ill unless your healthcare professional tells you to stop. This medicine can make you more sensitive to cold. Do not drink cold drinks or use ice. Cover exposed skin before coming in contact with cold temperatures or cold objects. When out in cold weather wear warm clothing and cover your mouth and nose to warm the air that goes into your lungs. Tell your doctor if you get sensitive to the cold. Do not become pregnant while taking this medicine or for 9 months after stopping it. Women should inform their health care professional if they wish to become pregnant or think they might be pregnant. Men should not father a child while taking this medicine and for 6 months after stopping it. There is potential for serious side effects to an unborn child. Talk to your health care professional for more information. Do not breast-feed a child while taking this medicine or for 3 months after stopping it. This medicine has caused ovarian failure in some women. This  medicine may make it more difficult to get pregnant. Talk to your health care professional if you are concerned about your fertility. This medicine has caused decreased sperm counts in some men. This may make it more difficult to father a child. Talk to your health care professional if you are concerned about your fertility. This medicine may increase your risk of getting an infection. Call your health care professional for advice if you get a fever, chills, or  sore throat, or other symptoms of a cold or flu. Do not treat yourself. Try to avoid being around people who are sick. Avoid taking medicines that contain aspirin, acetaminophen, ibuprofen, naproxen, or ketoprofen unless instructed by your health care professional. These medicines may hide a fever. Be careful brushing or flossing your teeth or using a toothpick because you may get an infection or bleed more easily. If you have any dental work done, tell your dentist you are receiving this medicine. What side effects may I notice from receiving this medication? Side effects that you should report to your doctor or health care professional as soon as possible: allergic reactions like skin rash, itching or hives, swelling of the face, lips, or tongue breathing problems cough low blood counts - this medicine may decrease the number of white blood cells, red blood cells, and platelets. You may be at increased risk for infections and bleeding nausea, vomiting pain, redness, or irritation at site where injected pain, tingling, numbness in the hands or feet signs and symptoms of bleeding such as bloody or black, tarry stools; red or dark brown urine; spitting up blood or brown material that looks like coffee grounds; red spots on the skin; unusual bruising or bleeding from the eyes, gums, or nose signs and symptoms of a dangerous change in heartbeat or heart rhythm like chest pain; dizziness; fast, irregular heartbeat; palpitations; feeling faint or lightheaded; falls signs and symptoms of infection like fever; chills; cough; sore throat; pain or trouble passing urine signs and symptoms of liver injury like dark yellow or brown urine; general ill feeling or flu-like symptoms; light-colored stools; loss of appetite; nausea; right upper belly pain; unusually weak or tired; yellowing of the eyes or skin signs and symptoms of low red blood cells or anemia such as unusually weak or tired; feeling faint or  lightheaded; falls signs and symptoms of muscle injury like dark urine; trouble passing urine or change in the amount of urine; unusually weak or tired; muscle pain; back pain Side effects that usually do not require medical attention (report to your doctor or health care professional if they continue or are bothersome): changes in taste diarrhea gas hair loss loss of appetite mouth sores This list may not describe all possible side effects. Call your doctor for medical advice about side effects. You may report side effects to FDA at 1-800-FDA-1088. Where should I keep my medication? This drug is given in a hospital or clinic and will not be stored at home. NOTE: This sheet is a summary. It may not cover all possible information. If you have questions about this medicine, talk to your doctor, pharmacist, or health care provider.  2022 [employer]vier/Gold Standard (2020-11-16 00:00:00)  Leucovorin injection What is this medication? LEUCOVORIN (loo koe VOR in) is used to prevent or treat the harmful effects of some medicines. This medicine is used to treat anemia caused by a low amount of folic acid in the body. It is also used with 5-fluorouracil (5-FU) to treat colon cancer. This medicine  may be used for other purposes; ask your health care provider or pharmacist if you have questions. What should I tell my care team before I take this medication? They need to know if you have any of these conditions: anemia from low levels of vitamin B-12 in the blood an unusual or allergic reaction to leucovorin, folic acid, other medicines, foods, dyes, or preservatives pregnant or trying to get pregnant breast-feeding How should I use this medication? This medicine is for injection into a muscle or into a vein. It is given by a health care professional in a hospital or clinic setting. Talk to your pediatrician regarding the use of this medicine in children. Special care may be needed. Overdosage: If you  think you have taken too much of this medicine contact a poison control center or emergency room at once. NOTE: This medicine is only for you. Do not share this medicine with others. What if I miss a dose? This does not apply. What may interact with this medication? capecitabine fluorouracil phenobarbital phenytoin primidone trimethoprim-sulfamethoxazole This list may not describe all possible interactions. Give your health care provider a list of all the medicines, herbs, non-prescription drugs, or dietary supplements you use. Also tell them if you smoke, drink alcohol, or use illegal drugs. Some items may interact with your medicine. What should I watch for while using this medication? Your condition will be monitored carefully while you are receiving this medicine. This medicine may increase the side effects of 5-fluorouracil, 5-FU. Tell your doctor or health care professional if you have diarrhea or mouth sores that do not get better or that get worse. What side effects may I notice from receiving this medication? Side effects that you should report to your doctor or health care professional as soon as possible: allergic reactions like skin rash, itching or hives, swelling of the face, lips, or tongue breathing problems fever, infection mouth sores unusual bleeding or bruising unusually weak or tired Side effects that usually do not require medical attention (report to your doctor or health care professional if they continue or are bothersome): constipation or diarrhea loss of appetite nausea, vomiting This list may not describe all possible side effects. Call your doctor for medical advice about side effects. You may report side effects to FDA at 1-800-FDA-1088. Where should I keep my medication? This drug is given in a hospital or clinic and will not be stored at home. NOTE: This sheet is a summary. It may not cover all possible information. If you have questions about this  medicine, talk to your doctor, pharmacist, or health care provider.  2022 Elsevier/Gold Standard (2007-09-05 00:00:00)  Fluorouracil, 5-FU injection What is this medication? FLUOROURACIL, 5-FU (flure oh YOOR a sil) is a chemotherapy drug. It slows the growth of cancer cells. This medicine is used to treat many types of cancer like breast cancer, colon or rectal cancer, pancreatic cancer, and stomach cancer. This medicine may be used for other purposes; ask your health care provider or pharmacist if you have questions. COMMON BRAND NAME(S): Adrucil What should I tell my care team before I take this medication? They need to know if you have any of these conditions: blood disorders dihydropyrimidine dehydrogenase (DPD) deficiency infection (especially a virus infection such as chickenpox, cold sores, or herpes) kidney disease liver disease malnourished, poor nutrition recent or ongoing radiation therapy an unusual or allergic reaction to fluorouracil, other chemotherapy, other medicines, foods, dyes, or preservatives pregnant or trying to get pregnant breast-feeding How should  I use this medication? This drug is given as an infusion or injection into a vein. It is administered in a hospital or clinic by a specially trained health care professional. Talk to your pediatrician regarding the use of this medicine in children. Special care may be needed. Overdosage: If you think you have taken too much of this medicine contact a poison control center or emergency room at once. NOTE: This medicine is only for you. Do not share this medicine with others. What if I miss a dose? It is important not to miss your dose. Call your doctor or health care professional if you are unable to keep an appointment. What may interact with this medication? Do not take this medicine with any of the following medications: live virus vaccines This medicine may also interact with the following medications: medicines  that treat or prevent blood clots like warfarin, enoxaparin, and dalteparin This list may not describe all possible interactions. Give your health care provider a list of all the medicines, herbs, non-prescription drugs, or dietary supplements you use. Also tell them if you smoke, drink alcohol, or use illegal drugs. Some items may interact with your medicine. What should I watch for while using this medication? Visit your doctor for checks on your progress. This drug may make you feel generally unwell. This is not uncommon, as chemotherapy can affect healthy cells as well as cancer cells. Report any side effects. Continue your course of treatment even though you feel ill unless your doctor tells you to stop. In some cases, you may be given additional medicines to help with side effects. Follow all directions for their use. Call your doctor or health care professional for advice if you get a fever, chills or sore throat, or other symptoms of a cold or flu. Do not treat yourself. This drug decreases your body's ability to fight infections. Try to avoid being around people who are sick. This medicine may increase your risk to bruise or bleed. Call your doctor or health care professional if you notice any unusual bleeding. Be careful brushing and flossing your teeth or using a toothpick because you may get an infection or bleed more easily. If you have any dental work done, tell your dentist you are receiving this medicine. Avoid taking products that contain aspirin, acetaminophen, ibuprofen, naproxen, or ketoprofen unless instructed by your doctor. These medicines may hide a fever. Do not become pregnant while taking this medicine. Women should inform their doctor if they wish to become pregnant or think they might be pregnant. There is a potential for serious side effects to an unborn child. Talk to your health care professional or pharmacist for more information. Do not breast-feed an infant while taking  this medicine. Men should inform their doctor if they wish to father a child. This medicine may lower sperm counts. Do not treat diarrhea with over the counter products. Contact your doctor if you have diarrhea that lasts more than 2 days or if it is severe and watery. This medicine can make you more sensitive to the sun. Keep out of the sun. If you cannot avoid being in the sun, wear protective clothing and use sunscreen. Do not use sun lamps or tanning beds/booths. What side effects may I notice from receiving this medication? Side effects that you should report to your doctor or health care professional as soon as possible: allergic reactions like skin rash, itching or hives, swelling of the face, lips, or tongue low blood counts - this  medicine may decrease the number of white blood cells, red blood cells and platelets. You may be at increased risk for infections and bleeding. signs of infection - fever or chills, cough, sore throat, pain or difficulty passing urine signs of decreased platelets or bleeding - bruising, pinpoint red spots on the skin, black, tarry stools, blood in the urine signs of decreased red blood cells - unusually weak or tired, fainting spells, lightheadedness breathing problems changes in vision chest pain mouth sores nausea and vomiting pain, swelling, redness at site where injected pain, tingling, numbness in the hands or feet redness, swelling, or sores on hands or feet stomach pain unusual bleeding Side effects that usually do not require medical attention (report to your doctor or health care professional if they continue or are bothersome): changes in finger or toe nails diarrhea dry or itchy skin hair loss headache loss of appetite sensitivity of eyes to the light stomach upset unusually teary eyes This list may not describe all possible side effects. Call your doctor for medical advice about side effects. You may report side effects to FDA at  1-800-FDA-1088. Where should I keep my medication? This drug is given in a hospital or clinic and will not be stored at home. NOTE: This sheet is a summary. It may not cover all possible information. If you have questions about this medicine, talk to your doctor, pharmacist, or health care provider.  2022 [employer]/Gold Standard (2020-11-16 00:00:00)  The chemotherapy medication bag should finish at 46 hours, 96 hours, or 7 days. For example, if your pump is scheduled for 46 hours and it was put on at 4:00 p.m., it should finish at 2:00 p.m. the day it is scheduled to come off regardless of your appointment time.     Estimated time to finish at 1:00pm on Wednesday 01/26/21.   If the display on your pump reads "Low Volume" and it is beeping, take the batteries out of the pump and come to the cancer center for it to be taken off.   If the pump alarms go off prior to the pump reading "Low Volume" then call [phone] and someone can assist you.  If the plunger comes out and the chemotherapy medication is leaking out, please use your home chemo spill kit to clean up the spill. Do NOT use paper towels or other household products.  If you have problems or questions regarding your pump, please call either [phone] (24 hours a day) or the cancer center Monday-Friday 8:00 a.m.- 4:30 p.m. at the clinic number and we will assist you. If you are unable to get assistance, then go to the nearest Emergency Department and ask the staff to contact the IV team for assistance.

## 2021-01-24 NOTE — Procedures (Signed)
Basilic vein double lumen PICC placed without immediate complications. Length 38 cm tip SVC / right atrial junction. Okay to use. Medication used - 1& lidocaine and subcutaneous tissues. EBL < 2 ml.

## 2021-01-24 NOTE — Progress Notes (Signed)
[place] OFFICE PROGRESS NOTE   Diagnosis: Colon cancer  INTERVAL HISTORY:   Dylan Fischer returns as scheduled.  He is feeling better.  He is no longer having abdominal pain.  No nausea/vomiting.  Stools are intermittently loose.  He continues to note leg edema.  Objective:  Vital signs in last 24 hours:  Blood pressure 123/80, pulse 63, temperature 97.8 F (36.6 C), temperature source Oral, resp. rate 20, height '6\' 4"'  (1.93 m), weight 210 lb 12.8 oz (95.6 kg), SpO2 100 %.    HEENT: No thrush or ulcers. Resp: Lungs clear bilaterally. Cardio: Regular rate and rhythm. GI: Abdomen soft and nontender.  No hepatomegaly.  No mass. Vascular: Trace edema below the knees bilaterally. Skin: Palms without erythema. PICC without erythema.   Lab Results:  Lab Results  Component Value Date   WBC 3.8 (L) 01/24/2021   HGB 12.5 (L) 01/24/2021   HCT 39.8 01/24/2021   MCV 87.1 01/24/2021   PLT 242 01/24/2021   NEUTROABS 2.5 01/24/2021    Imaging:  IR PICC PLACEMENT RIGHT >5 YRS INC IMG GUIDE  Result Date: 01/24/2021 INDICATION: Patient in need of intravenous access for chemotherapy purposes. Request is for PICC line placement EXAM: ULTRASOUND AND FLUOROSCOPIC GUIDED PICC LINE INSERTION MEDICATIONS: Lidocaine 1% 2 mL CONTRAST:  None FLUOROSCOPY TIME:  24 seconds (2 mGy) COMPLICATIONS: None immediate. TECHNIQUE: The procedure, risks, benefits, and alternatives were explained to the patient and informed written consent was obtained. The right upper extremity was prepped with chlorhexidine in a sterile fashion, and a sterile drape was applied covering the operative field. Maximum barrier sterile technique with sterile gowns and gloves were used for the procedure. A timeout was performed prior to the initiation of the procedure. Local anesthesia was provided with 1% lidocaine. After the overlying soft tissues were anesthetized and a micropuncture kit was utilized to access the  right basilic vein. Real-time ultrasound guidance was utilized for vascular access including the acquisition of a permanent ultrasound image documenting patency of the accessed vessel. A guidewire was advanced to the level of the superior caval-atrial junction for measurement purposes and the PICC line was cut to length. A peel-away sheath was placed and a 39 cm, 5 [place], dual lumen was inserted to level of the superior caval-atrial junction. A post procedure spot fluoroscopic was obtained. The catheter easily aspirated and flushed and was secured in place. A dressing was placed. The patient tolerated the procedure well without immediate post procedural complication. FINDINGS: After catheter placement, the tip lies within the superior cavoatrial junction. The catheter aspirates and flushes normally and is ready for immediate use. IMPRESSION: Successful ultrasound and fluoroscopic guided placement of a right basilic vein approach, 39 cm, 5 French, dual lumen PICC with tip at the superior caval-atrial junction. The PICC line is ready for immediate use. Read by: Rushie Nyhan, NP Electronically Signed   By: Aletta Edouard M.D.   On: 01/24/2021 09:36    Medications: I have reviewed the patient's current medications.  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, MSI-high, loss of MLH1 and PMS2 expression, negative for BRAF V600E mutation, MLH1 hypermethylation absent  Negative CancerNext panel-VUS in the [facility] gene colonoscopy 10/05/2020-near completely obstructing mass at the hepatic flexure-could not be passed, polyps in the sigmoid and transverse colon CTs 10/15/2020-short segment wall thickening with adjacent inflammatory  stranding at the hepatic flexure, prominent mesenteric nodes adjacent to the colonic neoplasm  measuring up to 5 mm, prominent gastropathic, hepatoduodenal ligament, and right sided retroperitoneal nodes measured up to 7 mm, subcentimeter hypodense hypodensities-too small to characterize, tiny right middle lobe nodule 12/13/2020-ctDNA not detected Cycle 1 CAPOX 12/13/2020, Xeloda discontinued around 12/24/2020 Cycle 1 FOLFOX 01/24/2021   Tubular adenoma on the colonoscopy 10/05/2020 and on colonoscopy 06/14/2010 Hypertension [facility] admission 12/30/2020-acute gastroenteritis/colitis Negative for the IVS14+ DPYD mutation  Disposition: Dylan Fischer appears stable.  Cycle 1 CAPOX tolerated poorly.  Plan to begin FOLFOX today.  Potential toxicities again reviewed.  He agrees to proceed.  He will return for lab, follow-up, FOLFOX in 2 weeks.  We are available to see him sooner if needed.    Ned Card ANP/GNP-BC   01/24/2021  11:10 AM

## 2021-01-24 NOTE — Progress Notes (Signed)
Patient presents for treatment. RN assessment completed along with the following:  Labs/vitals reviewed - Yes, and within treatment parameters.   Weight within 10% of previous measurement - Yes Oncology Treatment Attestation completed for current therapy- Yes, on date 12/29/20 Informed consent completed and reflects current therapy/intent - Yes, on date 01/24/21             Provider progress note reviewed - Yes, today's provider note was reviewed. Treatment/Antibody/Supportive plan reviewed - Yes, and there are no adjustments needed for today's treatment. S&H and other orders reviewed - Yes, and there are no additional orders identified. Previous treatment date reviewed - Yes, and the appropriate amount of time has elapsed between treatments. Clinic Hand Off Received from - Ned Card, NP  Patient to proceed with treatment.

## 2021-01-25 ENCOUNTER — Telehealth: Payer: Self-pay

## 2021-01-25 NOTE — Telephone Encounter (Signed)
Called patient for first time chemo followup.  Left VM for patient to call office with any questions or concerns.

## 2021-01-26 ENCOUNTER — Inpatient Hospital Stay: Payer: 59

## 2021-01-26 ENCOUNTER — Other Ambulatory Visit: Payer: Self-pay

## 2021-01-26 ENCOUNTER — Encounter: Payer: 59 | Admitting: Nutrition

## 2021-01-26 ENCOUNTER — Telehealth: Payer: Self-pay | Admitting: Nutrition

## 2021-01-26 VITALS — BP 133/79 | HR 60 | Temp 98.3°F | Resp 19

## 2021-01-26 DIAGNOSIS — C184 Malignant neoplasm of transverse colon: Secondary | ICD-10-CM

## 2021-01-26 DIAGNOSIS — Z5111 Encounter for antineoplastic chemotherapy: Secondary | ICD-10-CM | POA: Diagnosis not present

## 2021-01-26 MED ORDER — HEPARIN SOD (PORK) LOCK FLUSH 100 UNIT/ML IV SOLN
500.0000 [IU] | Freq: Once | INTRAVENOUS | Status: AC | PRN
  Administered 2021-01-26: 250 [IU]

## 2021-01-26 MED ORDER — SODIUM CHLORIDE 0.9% FLUSH
10.0000 mL | INTRAVENOUS | Status: DC | PRN
  Administered 2021-01-26: 10 mL

## 2021-01-26 NOTE — Patient Instructions (Signed)
PICC Home Care Guide A peripherally inserted central catheter (PICC) is a form of IV access that allows medicines and IV fluids to be quickly put into the blood and spread throughout the body. The PICC is a long, thin, flexible tube (catheter) that is put into a vein in a person's arm or leg. The catheter ends in a large vein just outside the heart called the superior vena cava (SVC). After the PICC is put in, a chest X-ray may be done to make sure that it is in the right place. A PICC may be placed for different reasons, such as: To give medicines and liquid nutrition. To give IV fluids and blood products. To take blood samples often. If there is trouble placing a peripheral intravenous (PIV) catheter. If cared for properly, a PICC can remain in place for many months. Having a PICC can allow you to go home from the hospital sooner and continue treatment at home. Medicines and PICC care can be managed at home by a family member, caregiver, or home health care team. What are the risks? Generally, having a PICC is safe. However, problems may occur, including: A blood clot (thrombus) forming in or at the end of the PICC. A blood clot forming in a vein (deep vein thrombosis) or traveling to the lung (pulmonary embolism). Inflammation of the vein (phlebitis) in which the PICC is placed. Infection at the insertion site or in the blood. Blood infections from central lines, like PICCs, can be serious and often require a hospital stay. PICC malposition, or PICC movement or poor placement. A break or cut in the PICC. Do not use scissors near the PICC. Nerve or tendon irritation or injury during PICC insertion. How to care for your PICC Please follow the specific guidelines provided by your health care provider. Preventing infection You and any caregivers should wash your hands often with soap and water for at least 20 seconds. Wash hands: Before touching the PICC or the infusion device. Before changing a  bandage (dressing). Do not change the dressing unless you have been taught to do so and have shown you are able to change it safely. Flush the PICC as told. Tell your health care provider right away if the PICC is hard to flush or does not flush. Do not use force to flush the PICC. Use clean and germ-free (sterile) supplies only. Keep the supplies in a dry place. Do not reuse needles, syringes, or any other supplies. Reusing supplies can lead to infection. Keep the PICC dressing dry and secure it with tape if the edges stop sticking to your skin. Check your PICC insertion site every day for signs of infection. Check for: Redness, swelling, or pain. Fluid or blood. Warmth. Pus or a bad smell. Preventing other problems Do not use a syringe that is less than 10 mL to flush the PICC. Do not have your blood pressure checked on the arm in which the PICC is placed. Do not ever pull or tug on the PICC. Keep it secured to your arm with tape or a stretch wrap when not in use. Do not take the PICC out yourself. Only a trained health care provider should remove the PICC. Keep pets and children away from your PICC. How to care for your PICC dressing Keep your PICC dressing clean and dry to prevent infection. Do not take baths, swim, or use a hot tub until your health care provider approves. Ask your health care provider if you can take   showers. You may only be allowed to take sponge baths. When you are allowed to shower: Ask your health care provider to teach you how to wrap the PICC. Cover the PICC with clear plastic wrap and tape to keep it dry while showering. Follow instructions from your health care provider about how to take care of your insertion site and dressing. Make sure you: Wash your hands with soap and water for at least 20 seconds before and after you change your dressing. If soap and water are not available, use hand sanitizer. Change your dressing only if taught to do so by your health care  provider. Your PICC dressing needs to be changed if it becomes loose or wet. Leave stitches (sutures), skin glue, or adhesive strips in place. These skin closures may need to stay in place for 2 weeks or longer. If adhesive strip edges start to loosen and curl up, you may trim the loose edges. Do not remove adhesive strips completely unless your health care provider tells you to do that. Follow these instructions at home: Disposal of supplies Throw away any syringes in a disposal container that is meant for sharp items (sharps container). You can buy a sharps container from a pharmacy, or you can make one by using an empty, hard plastic bottle with a lid. Place any used dressings or infusion bags into a plastic bag. Throw that bag in the trash. General instructions  Always carry your PICC identification card or wear a medical alert bracelet. Keep the tube clamped at all times, unless it is being used. Always carry a smooth-edge clamp with you to clamp the PICC if it breaks. Do not use scissors or sharp objects near the tube. You may bend your arm and move it freely. If your PICC is near or at the bend of your elbow, avoid activity with repeated motion at the elbow. Avoid lifting heavy objects as told by your health care provider. Keep all follow-up visits. This is important. You will need to have your PICC dressing changed at least once a week. Contact a health care provider if: You have pain in your arm, ear, face, or teeth. You have a fever or chills. You have redness, swelling, or pain around the insertion site. You have fluid or blood coming from the insertion site. Your insertion site feels warm to the touch. You have pus or a bad smell coming from the insertion site. Your skin feels hard and raised around the insertion site. Your PICC dressing has gotten wet or is coming off and you have not been taught how to change it. Get help right away if: You have problems with your PICC, such as  your PICC: Was tugged or pulled and has partially come out. Do not  push the PICC back in. Cannot be flushed, is hard to flush, or leaks around the insertion site when it is flushed. Makes a flushing sound when it is flushed. Appears to have a hole or tear. Is accidentally pulled all the way out. If this happens, cover the insertion site with a gauze dressing. Do not throw the PICC away. Your health care provider will need to check it to be sure the entire catheter came out. You feel your heart racing or skipping beats, or you have chest pain. You have shortness of breath or trouble breathing. You have swelling, redness, warmth, or pain in the arm in which the PICC is placed. You have a red streak going up your arm that   starts under the PICC dressing. These symptoms may be an emergency. Get help right away. Call 911. Do not wait to see if the symptoms will go away. Do not drive yourself to the hospital. Summary A peripherally inserted central catheter (PICC) is a long, thin, flexible tube (catheter) that is put into a vein in the arm or leg. If cared for properly, a PICC can remain in place for many months. Having a PICC can allow you to go home from the hospital sooner and continue treatment at home. The PICC is inserted using a germ-free (sterile) technique by a specially trained health care provider. Only a trained health care provider should remove it. Do not have your blood pressure checked on the arm in which your PICC is placed. Always keep your PICC identification card with you. This information is not intended to replace advice given to you by your health care provider. Make sure you discuss any questions you have with your health care provider. Document Revised: 09/15/2020 Document Reviewed: 09/15/2020 Elsevier Patient Education  2022 [employer]  

## 2021-01-26 NOTE — Telephone Encounter (Signed)
Telephone follow up completed with patient. Patient diagnosed with Colon cancer. He had a treatment change to FOLFOX. He is scheduled to get his pump off today.  Weight documented as 210.8 pounds. Noted weight fluctuations likely related to edema. Pt reports he continues to have some leg edema. Glucose 137.  Patient feels better and currently denies nausea and diarrhea. He is consistent with his nausea medication. Reports he consumes one serving of carnation instant breakfast made with high protein almond milk. He is focused on eating increased protein. Continues to follow a low fiber diet.  Educated to continue strategies for increased calories and protein in small frequent meals and snacks. Continue Carnation Instant Breakfast at least once daily. Continue activity to support increased strength. Contact RD as needed.

## 2021-01-28 ENCOUNTER — Other Ambulatory Visit: Payer: Self-pay

## 2021-01-28 ENCOUNTER — Inpatient Hospital Stay: Payer: 59

## 2021-01-28 VITALS — BP 130/74 | HR 68 | Temp 97.8°F | Resp 18

## 2021-01-28 DIAGNOSIS — Z5111 Encounter for antineoplastic chemotherapy: Secondary | ICD-10-CM | POA: Diagnosis not present

## 2021-01-28 DIAGNOSIS — Z452 Encounter for adjustment and management of vascular access device: Secondary | ICD-10-CM

## 2021-01-28 MED ORDER — HEPARIN SOD (PORK) LOCK FLUSH 100 UNIT/ML IV SOLN
500.0000 [IU] | Freq: Once | INTRAVENOUS | Status: AC
  Administered 2021-01-28: 250 [IU] via INTRAVENOUS

## 2021-01-28 MED ORDER — SODIUM CHLORIDE 0.9% FLUSH
10.0000 mL | Freq: Once | INTRAVENOUS | Status: AC
  Administered 2021-01-28: 10 mL via INTRAVENOUS

## 2021-01-31 ENCOUNTER — Inpatient Hospital Stay: Payer: 59

## 2021-02-01 ENCOUNTER — Other Ambulatory Visit: Payer: Self-pay

## 2021-02-01 ENCOUNTER — Inpatient Hospital Stay: Payer: 59

## 2021-02-01 VITALS — BP 121/80 | HR 70 | Temp 97.8°F | Resp 18

## 2021-02-01 DIAGNOSIS — Z452 Encounter for adjustment and management of vascular access device: Secondary | ICD-10-CM

## 2021-02-01 DIAGNOSIS — Z5111 Encounter for antineoplastic chemotherapy: Secondary | ICD-10-CM | POA: Diagnosis not present

## 2021-02-01 MED ORDER — HEPARIN SOD (PORK) LOCK FLUSH 100 UNIT/ML IV SOLN
250.0000 [IU] | Freq: Once | INTRAVENOUS | Status: AC
  Administered 2021-02-01: 250 [IU] via INTRAVENOUS

## 2021-02-01 MED ORDER — SODIUM CHLORIDE 0.9% FLUSH
10.0000 mL | Freq: Once | INTRAVENOUS | Status: AC
  Administered 2021-02-01: 10 mL via INTRAVENOUS

## 2021-02-01 NOTE — Patient Instructions (Signed)
PICC Home Care Guide A peripherally inserted central catheter (PICC) is a form of IV access that allows medicines and IV fluids to be quickly put into the blood and spread throughout the body. The PICC is a long, thin, flexible tube (catheter) that is put into a vein in a person's arm or leg. The catheter ends in a large vein just outside the heart called the superior vena cava (SVC). After the PICC is put in, a chest X-ray may be done to make sure that it is in the right place. A PICC may be placed for different reasons, such as: To give medicines and liquid nutrition. To give IV fluids and blood products. To take blood samples often. If there is trouble placing a peripheral intravenous (PIV) catheter. If cared for properly, a PICC can remain in place for many months. Having a PICC can allow you to go home from the hospital sooner and continue treatment at home. Medicines and PICC care can be managed at home by a family member, caregiver, or home health care team. What are the risks? Generally, having a PICC is safe. However, problems may occur, including: A blood clot (thrombus) forming in or at the end of the PICC. A blood clot forming in a vein (deep vein thrombosis) or traveling to the lung (pulmonary embolism). Inflammation of the vein (phlebitis) in which the PICC is placed. Infection at the insertion site or in the blood. Blood infections from central lines, like PICCs, can be serious and often require a hospital stay. PICC malposition, or PICC movement or poor placement. A break or cut in the PICC. Do not use scissors near the PICC. Nerve or tendon irritation or injury during PICC insertion. How to care for your PICC Please follow the specific guidelines provided by your health care provider. Preventing infection You and any caregivers should wash your hands often with soap and water for at least 20 seconds. Wash hands: Before touching the PICC or the infusion device. Before changing a  bandage (dressing). Do not change the dressing unless you have been taught to do so and have shown you are able to change it safely. Flush the PICC as told. Tell your health care provider right away if the PICC is hard to flush or does not flush. Do not use force to flush the PICC. Use clean and germ-free (sterile) supplies only. Keep the supplies in a dry place. Do not reuse needles, syringes, or any other supplies. Reusing supplies can lead to infection. Keep the PICC dressing dry and secure it with tape if the edges stop sticking to your skin. Check your PICC insertion site every day for signs of infection. Check for: Redness, swelling, or pain. Fluid or blood. Warmth. Pus or a bad smell. Preventing other problems Do not use a syringe that is less than 10 mL to flush the PICC. Do not have your blood pressure checked on the arm in which the PICC is placed. Do not ever pull or tug on the PICC. Keep it secured to your arm with tape or a stretch wrap when not in use. Do not take the PICC out yourself. Only a trained health care provider should remove the PICC. Keep pets and children away from your PICC. How to care for your PICC dressing Keep your PICC dressing clean and dry to prevent infection. Do not take baths, swim, or use a hot tub until your health care provider approves. Ask your health care provider if you can take   showers. You may only be allowed to take sponge baths. When you are allowed to shower: Ask your health care provider to teach you how to wrap the PICC. Cover the PICC with clear plastic wrap and tape to keep it dry while showering. Follow instructions from your health care provider about how to take care of your insertion site and dressing. Make sure you: Wash your hands with soap and water for at least 20 seconds before and after you change your dressing. If soap and water are not available, use hand sanitizer. Change your dressing only if taught to do so by your health care  provider. Your PICC dressing needs to be changed if it becomes loose or wet. Leave stitches (sutures), skin glue, or adhesive strips in place. These skin closures may need to stay in place for 2 weeks or longer. If adhesive strip edges start to loosen and curl up, you may trim the loose edges. Do not remove adhesive strips completely unless your health care provider tells you to do that. Follow these instructions at home: Disposal of supplies Throw away any syringes in a disposal container that is meant for sharp items (sharps container). You can buy a sharps container from a pharmacy, or you can make one by using an empty, hard plastic bottle with a lid. Place any used dressings or infusion bags into a plastic bag. Throw that bag in the trash. General instructions  Always carry your PICC identification card or wear a medical alert bracelet. Keep the tube clamped at all times, unless it is being used. Always carry a smooth-edge clamp with you to clamp the PICC if it breaks. Do not use scissors or sharp objects near the tube. You may bend your arm and move it freely. If your PICC is near or at the bend of your elbow, avoid activity with repeated motion at the elbow. Avoid lifting heavy objects as told by your health care provider. Keep all follow-up visits. This is important. You will need to have your PICC dressing changed at least once a week. Contact a health care provider if: You have pain in your arm, ear, face, or teeth. You have a fever or chills. You have redness, swelling, or pain around the insertion site. You have fluid or blood coming from the insertion site. Your insertion site feels warm to the touch. You have pus or a bad smell coming from the insertion site. Your skin feels hard and raised around the insertion site. Your PICC dressing has gotten wet or is coming off and you have not been taught how to change it. Get help right away if: You have problems with your PICC, such as  your PICC: Was tugged or pulled and has partially come out. Do not  push the PICC back in. Cannot be flushed, is hard to flush, or leaks around the insertion site when it is flushed. Makes a flushing sound when it is flushed. Appears to have a hole or tear. Is accidentally pulled all the way out. If this happens, cover the insertion site with a gauze dressing. Do not throw the PICC away. Your health care provider will need to check it to be sure the entire catheter came out. You feel your heart racing or skipping beats, or you have chest pain. You have shortness of breath or trouble breathing. You have swelling, redness, warmth, or pain in the arm in which the PICC is placed. You have a red streak going up your arm that   starts under the PICC dressing. These symptoms may be an emergency. Get help right away. Call 911. Do not wait to see if the symptoms will go away. Do not drive yourself to the hospital. Summary A peripherally inserted central catheter (PICC) is a long, thin, flexible tube (catheter) that is put into a vein in the arm or leg. If cared for properly, a PICC can remain in place for many months. Having a PICC can allow you to go home from the hospital sooner and continue treatment at home. The PICC is inserted using a germ-free (sterile) technique by a specially trained health care provider. Only a trained health care provider should remove it. Do not have your blood pressure checked on the arm in which your PICC is placed. Always keep your PICC identification card with you. This information is not intended to replace advice given to you by your health care provider. Make sure you discuss any questions you have with your health care provider. Document Revised: 09/15/2020 Document Reviewed: 09/15/2020 Elsevier Patient Education  2022 [employer]  

## 2021-02-06 ENCOUNTER — Other Ambulatory Visit: Payer: Self-pay | Admitting: Oncology

## 2021-02-07 ENCOUNTER — Inpatient Hospital Stay: Payer: 59

## 2021-02-07 ENCOUNTER — Other Ambulatory Visit: Payer: Self-pay

## 2021-02-07 ENCOUNTER — Inpatient Hospital Stay (HOSPITAL_BASED_OUTPATIENT_CLINIC_OR_DEPARTMENT_OTHER): Payer: 59 | Admitting: Nurse Practitioner

## 2021-02-07 ENCOUNTER — Encounter: Payer: Self-pay | Admitting: Nurse Practitioner

## 2021-02-07 VITALS — BP 127/80 | HR 66

## 2021-02-07 VITALS — BP 124/88 | HR 71 | Temp 97.9°F | Resp 18 | Ht 76.0 in | Wt 210.0 lb

## 2021-02-07 DIAGNOSIS — C189 Malignant neoplasm of colon, unspecified: Secondary | ICD-10-CM

## 2021-02-07 DIAGNOSIS — Z5111 Encounter for antineoplastic chemotherapy: Secondary | ICD-10-CM | POA: Diagnosis not present

## 2021-02-07 DIAGNOSIS — C184 Malignant neoplasm of transverse colon: Secondary | ICD-10-CM

## 2021-02-07 LAB — CBC WITH DIFFERENTIAL (CANCER CENTER ONLY)
Abs Immature Granulocytes: 0.02 10*3/uL (ref 0.00–0.07)
Basophils Absolute: 0 10*3/uL (ref 0.0–0.1)
Basophils Relative: 0 %
Eosinophils Absolute: 0.2 10*3/uL (ref 0.0–0.5)
Eosinophils Relative: 3 %
HCT: 39.9 % (ref 39.0–52.0)
Hemoglobin: 13.2 g/dL (ref 13.0–17.0)
Immature Granulocytes: 0 %
Lymphocytes Relative: 18 %
Lymphs Abs: 1 10*3/uL (ref 0.7–4.0)
MCH: 28.3 pg (ref 26.0–34.0)
MCHC: 33.1 g/dL (ref 30.0–36.0)
MCV: 85.4 fL (ref 80.0–100.0)
Monocytes Absolute: 0.5 10*3/uL (ref 0.1–1.0)
Monocytes Relative: 9 %
Neutro Abs: 3.9 10*3/uL (ref 1.7–7.7)
Neutrophils Relative %: 70 %
Platelet Count: 144 10*3/uL — ABNORMAL LOW (ref 150–400)
RBC: 4.67 MIL/uL (ref 4.22–5.81)
RDW: 16.8 % — ABNORMAL HIGH (ref 11.5–15.5)
WBC Count: 5.6 10*3/uL (ref 4.0–10.5)
nRBC: 0 % (ref 0.0–0.2)

## 2021-02-07 LAB — CMP (CANCER CENTER ONLY)
ALT: 20 U/L (ref 0–44)
AST: 21 U/L (ref 15–41)
Albumin: 4.2 g/dL (ref 3.5–5.0)
Alkaline Phosphatase: 55 U/L (ref 38–126)
Anion gap: 8 (ref 5–15)
BUN: 8 mg/dL (ref 8–23)
CO2: 27 mmol/L (ref 22–32)
Calcium: 9.6 mg/dL (ref 8.9–10.3)
Chloride: 105 mmol/L (ref 98–111)
Creatinine: 0.71 mg/dL (ref 0.61–1.24)
GFR, Estimated: >60 mL/min (ref >60)
Glucose, Bld: 151 mg/dL — ABNORMAL HIGH (ref 70–99)
Potassium: 3.6 mmol/L (ref 3.5–5.1)
Sodium: 140 mmol/L (ref 135–145)
Total Bilirubin: 0.8 mg/dL (ref 0.3–1.2)
Total Protein: 6.2 g/dL — ABNORMAL LOW (ref 6.5–8.1)

## 2021-02-07 MED ORDER — LEUCOVORIN CALCIUM INJECTION 350 MG
200.0000 mg/m2 | Freq: Once | INTRAVENOUS | Status: AC
  Administered 2021-02-07: 10:00:00 444 mg via INTRAVENOUS
  Filled 2021-02-07: qty 22.2

## 2021-02-07 MED ORDER — OXALIPLATIN CHEMO INJECTION 100 MG/20ML
65.0000 mg/m2 | Freq: Once | INTRAVENOUS | Status: AC
  Administered 2021-02-07: 10:00:00 145 mg via INTRAVENOUS
  Filled 2021-02-07: qty 20

## 2021-02-07 MED ORDER — DEXTROSE 5 % IV SOLN: Freq: Once | INTRAVENOUS | Status: AC

## 2021-02-07 MED ORDER — SODIUM CHLORIDE 0.9 % IV SOLN
1800.0000 mg/m2 | INTRAVENOUS | Status: DC
  Administered 2021-02-07: 12:00:00 4000 mg via INTRAVENOUS
  Filled 2021-02-07: qty 80

## 2021-02-07 MED ORDER — FLUOROURACIL CHEMO INJECTION 500 MG/10ML
200.0000 mg/m2 | Freq: Once | INTRAVENOUS | Status: AC
  Administered 2021-02-07: 12:00:00 450 mg via INTRAVENOUS
  Filled 2021-02-07: qty 9

## 2021-02-07 MED ORDER — PROMETHAZINE HCL 12.5 MG PO TABS: 12.5000 mg | ORAL_TABLET | Freq: Four times a day (QID) | ORAL | 3 refills | Status: DC | PRN

## 2021-02-07 MED ORDER — PALONOSETRON HCL INJECTION 0.25 MG/5ML
0.2500 mg | Freq: Once | INTRAVENOUS | Status: AC
  Administered 2021-02-07: 09:00:00 0.25 mg via INTRAVENOUS
  Filled 2021-02-07: qty 5

## 2021-02-07 MED ORDER — SODIUM CHLORIDE 0.9 % IV SOLN
10.0000 mg | Freq: Once | INTRAVENOUS | Status: AC
  Administered 2021-02-07: 09:00:00 10 mg via INTRAVENOUS
  Filled 2021-02-07: qty 1

## 2021-02-07 MED ORDER — SODIUM CHLORIDE 0.9 % IV SOLN
150.0000 mg | Freq: Once | INTRAVENOUS | Status: AC
  Administered 2021-02-07: 09:00:00 150 mg via INTRAVENOUS
  Filled 2021-02-07: qty 5

## 2021-02-07 NOTE — Progress Notes (Signed)
Patient presents for treatment. RN assessment completed along with the following:  Labs/vitals reviewed - Yes, and within treatment parameters.   Weight within 10% of previous measurement - Yes Oncology Treatment Attestation completed for current therapy- Yes, on date 01/29/21 Informed consent completed and reflects current therapy/intent - Yes, on date 01/24/21             Provider progress note reviewed - Yes, today's provider note was reviewed. Treatment/Antibody/Supportive plan reviewed - Yes, and there are no adjustments needed for today's treatment. S&H and other orders reviewed - Yes, and there are no additional orders identified. Previous treatment date reviewed - Yes, and the appropriate amount of time has elapsed between treatments. Clinic Hand Off Received from - none   Patient to proceed with treatment.

## 2021-02-07 NOTE — Progress Notes (Signed)
  [place] OFFICE PROGRESS NOTE   Diagnosis: Colon cancer  INTERVAL HISTORY:   Dylan Fischer returns as scheduled.  He completed a cycle of FOLFOX 01/24/2021.  He had mild nausea intermittently the first week.  The nausea was relieved with his home medications including Phenergan and Zofran.  No vomiting.  No mouth sores.  No diarrhea.  No abdominal pain.  Cold sensitivity lasted 5 or 6 days.  Objective:  Vital signs in last 24 hours:  Blood pressure 124/88, pulse 71, temperature 97.9 F (36.6 C), temperature source Oral, resp. rate 18, height $RemoveBe'6\' 4"'DLIlaveHx$  (1.93 m), weight 210 lb (95.3 kg), SpO2 100 %.    HEENT: No thrush or ulcers. Resp: Lungs clear bilaterally. Cardio: Regular rate and rhythm. GI: Abdomen soft and nontender.  No hepatosplenomegaly. Vascular: No leg edema. Skin: Palms without erythema. Port-A-Cath without erythema.   Lab Results:  Lab Results  Component Value Date   WBC 3.8 (L) 01/24/2021   HGB 12.5 (L) 01/24/2021   HCT 39.8 01/24/2021   MCV 87.1 01/24/2021   PLT 242 01/24/2021   NEUTROABS 2.5 01/24/2021    Imaging:  No results found.  Medications: I have reviewed the patient's current medications.  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, MSI-high, loss of MLH1 and PMS2 expression, negative for BRAF V600E mutation, MLH1 hypermethylation absent  Negative CancerNext panel-VUS in the [facility] gene colonoscopy 10/05/2020-near completely obstructing mass at the hepatic flexure-could not be passed, polyps in the sigmoid and transverse colon CTs 10/15/2020-short segment wall thickening with adjacent inflammatory stranding at the hepatic flexure, prominent mesenteric nodes adjacent to the colonic neoplasm measuring up to 5 mm, prominent gastropathic, hepatoduodenal  ligament, and right sided retroperitoneal nodes measured up to 7 mm, subcentimeter hypodense hypodensities-too small to characterize, tiny right middle lobe nodule 12/13/2020-ctDNA not detected Cycle 1 CAPOX 12/13/2020, Xeloda discontinued around 12/24/2020 Cycle 1 FOLFOX 01/24/2021 Cycle 2 FOLFOX 02/07/2021   Tubular adenoma on the colonoscopy 10/05/2020 and on colonoscopy 06/14/2010 Hypertension [facility] admission 12/30/2020-acute gastroenteritis/colitis Negative for the IVS14+ DPYD mutation  Disposition: Dylan Fischer appears stable.  He completed a cycle of FOLFOX 01/24/2021.  He tolerated the cycle of FOLFOX much better than the cycle of CAPOX.  He had mild nausea, relieved with his home medications.  We discussed prophylactic dexamethasone.  He does not want to make any changes with this cycle.  Plan to proceed with cycle 2 FOLFOX today as scheduled.  CBC from today reviewed.  Counts adequate to proceed with treatment.  He will return for lab, follow-up, FOLFOX in 2 weeks.  He will contact the office in the interim with any problems.  We specifically discussed poorly controlled nausea.    Ned Card ANP/GNP-BC   02/07/2021  8:16 AM

## 2021-02-07 NOTE — Patient Instructions (Signed)
The chemotherapy medication bag should finish at 46 hours, 96 hours, or 7 days. For example, if your pump is scheduled for 46 hours and it was put on at 4:00 p.m., it should finish at 2:00 p.m. the day it is scheduled to come off regardless of your appointment time.     Estimated time to finish at 10:30 Wednesday, November 30, 22.   If the display on your pump reads "Low Volume" and it is beeping, take the batteries out of the pump and come to the cancer center for it to be taken off.   If the pump alarms go off prior to the pump reading "Low Volume" then call [phone] and someone can assist you.  If the plunger comes out and the chemotherapy medication is leaking out, please use your home chemo spill kit to clean up the spill. Do NOT use paper towels or other household products.  If you have problems or questions regarding your pump, please call either [phone] (24 hours a day) or the cancer center Monday-Friday 8:00 a.m.- 4:30 p.m. at the clinic number and we will assist you. If you are unable to get assistance, then go to the nearest Emergency Department and ask the staff to contact the IV team for assistance.  [facility]   Discharge Instructions: Thank you for choosing [facility] to provide your oncology and hematology care.   If you have a lab appointment with the [facility], please go directly to the [facility] and check in at the registration area.   Wear comfortable clothing and clothing appropriate for easy access to any Portacath or PICC line.   We strive to give you quality time with your provider. You may need to reschedule your appointment if you arrive late (15 or more minutes).  Arriving late affects you and other patients whose appointments are after yours.  Also, if you miss three or more appointments without notifying the office, you may be dismissed from the clinic at the provider's discretion.      For prescription  refill requests, have your pharmacy contact our office and allow 72 hours for refills to be completed.    Today you received the following chemotherapy and/or immunotherapy agents Oxaliplatin, leucovorin, fluorouracil      To help prevent nausea and vomiting after your treatment, we encourage you to take your nausea medication as directed.  BELOW ARE SYMPTOMS THAT SHOULD BE REPORTED IMMEDIATELY: *FEVER GREATER THAN 100.4 F (38 C) OR HIGHER *CHILLS OR SWEATING *NAUSEA AND VOMITING THAT IS NOT CONTROLLED WITH YOUR NAUSEA MEDICATION *UNUSUAL SHORTNESS OF BREATH *UNUSUAL BRUISING OR BLEEDING *URINARY PROBLEMS (pain or burning when urinating, or frequent urination) *BOWEL PROBLEMS (unusual diarrhea, constipation, pain near the anus) TENDERNESS IN MOUTH AND THROAT WITH OR WITHOUT PRESENCE OF ULCERS (sore throat, sores in mouth, or a toothache) UNUSUAL RASH, SWELLING OR PAIN  UNUSUAL VAGINAL DISCHARGE OR ITCHING   Items with * indicate a potential emergency and should be followed up as soon as possible or go to the Emergency Department if any problems should occur.  Please show the CHEMOTHERAPY ALERT CARD or IMMUNOTHERAPY ALERT CARD at check-in to the Emergency Department and triage nurse.  Should you have questions after your visit or need to cancel or reschedule your appointment, please contact [facility]  Dept: [phone]  and follow the prompts.  Office hours are 8:00 a.m. to 4:30 p.m. Monday - Friday. Please note that voicemails  left after 4:00 p.m. may not be returned until the following business day.  We are closed weekends and major holidays. You have access to a nurse at all times for urgent questions. Please call the main number to the clinic Dept: [phone] and follow the prompts.   For any non-urgent questions, you may also contact your provider using MyChart. We now offer e-Visits for anyone 73 and older to request care online for non-urgent symptoms.  For details visit [email or web address].   Also download the MyChart app! Go to the app store, search "MyChart", open the app, select [facility], and log in with your MyChart username and password.  Due to Covid, a mask is required upon entering the hospital/clinic. If you do not have a mask, one will be given to you upon arrival. For doctor visits, patients may have 1 support person aged 22 or older with them. For treatment visits, patients cannot have anyone with them due to current Covid guidelines and our immunocompromised population.   Oxaliplatin Injection What is this medication? OXALIPLATIN (ox AL i PLA tin) is a chemotherapy drug. It targets fast dividing cells, like cancer cells, and causes these cells to die. This medicine is used to treat cancers of the colon and rectum, and many other cancers. This medicine may be used for other purposes; ask your health care provider or pharmacist if you have questions. COMMON BRAND NAME(S): Eloxatin What should I tell my care team before I take this medication? They need to know if you have any of these conditions: heart disease history of irregular heartbeat liver disease low blood counts, like white cells, platelets, or red blood cells lung or breathing disease, like asthma take medicines that treat or prevent blood clots tingling of the fingers or toes, or other nerve disorder an unusual or allergic reaction to oxaliplatin, other chemotherapy, other medicines, foods, dyes, or preservatives pregnant or trying to get pregnant breast-feeding How should I use this medication? This drug is given as an infusion into a vein. It is administered in a hospital or clinic by a specially trained health care professional. Talk to your pediatrician regarding the use of this medicine in children. Special care may be needed. Overdosage: If you think you have taken too much of this medicine contact a poison control center or emergency room at once. NOTE:  This medicine is only for you. Do not share this medicine with others. What if I miss a dose? It is important not to miss a dose. Call your doctor or health care professional if you are unable to keep an appointment. What may interact with this medication? Do not take this medicine with any of the following medications: cisapride dronedarone pimozide thioridazine This medicine may also interact with the following medications: aspirin and aspirin-like medicines certain medicines that treat or prevent blood clots like warfarin, apixaban, dabigatran, and rivaroxaban cisplatin cyclosporine diuretics medicines for infection like acyclovir, adefovir, amphotericin B, bacitracin, cidofovir, foscarnet, ganciclovir, gentamicin, pentamidine, vancomycin NSAIDs, medicines for pain and inflammation, like ibuprofen or naproxen other medicines that prolong the QT interval (an abnormal heart rhythm) pamidronate zoledronic acid This list may not describe all possible interactions. Give your health care provider a list of all the medicines, herbs, non-prescription drugs, or dietary supplements you use. Also tell them if you smoke, drink alcohol, or use illegal drugs. Some items may interact with your medicine. What should I watch for while using this medication? Your condition will be monitored carefully while you are receiving this  medicine. You may need blood work done while you are taking this medicine. This medicine may make you feel generally unwell. This is not uncommon as chemotherapy can affect healthy cells as well as cancer cells. Report any side effects. Continue your course of treatment even though you feel ill unless your healthcare professional tells you to stop. This medicine can make you more sensitive to cold. Do not drink cold drinks or use ice. Cover exposed skin before coming in contact with cold temperatures or cold objects. When out in cold weather wear warm clothing and cover your mouth  and nose to warm the air that goes into your lungs. Tell your doctor if you get sensitive to the cold. Do not become pregnant while taking this medicine or for 9 months after stopping it. Women should inform their health care professional if they wish to become pregnant or think they might be pregnant. Men should not father a child while taking this medicine and for 6 months after stopping it. There is potential for serious side effects to an unborn child. Talk to your health care professional for more information. Do not breast-feed a child while taking this medicine or for 3 months after stopping it. This medicine has caused ovarian failure in some women. This medicine may make it more difficult to get pregnant. Talk to your health care professional if you are concerned about your fertility. This medicine has caused decreased sperm counts in some men. This may make it more difficult to father a child. Talk to your health care professional if you are concerned about your fertility. This medicine may increase your risk of getting an infection. Call your health care professional for advice if you get a fever, chills, or sore throat, or other symptoms of a cold or flu. Do not treat yourself. Try to avoid being around people who are sick. Avoid taking medicines that contain aspirin, acetaminophen, ibuprofen, naproxen, or ketoprofen unless instructed by your health care professional. These medicines may hide a fever. Be careful brushing or flossing your teeth or using a toothpick because you may get an infection or bleed more easily. If you have any dental work done, tell your dentist you are receiving this medicine. What side effects may I notice from receiving this medication? Side effects that you should report to your doctor or health care professional as soon as possible: allergic reactions like skin rash, itching or hives, swelling of the face, lips, or tongue breathing problems cough low blood counts  - this medicine may decrease the number of white blood cells, red blood cells, and platelets. You may be at increased risk for infections and bleeding nausea, vomiting pain, redness, or irritation at site where injected pain, tingling, numbness in the hands or feet signs and symptoms of bleeding such as bloody or black, tarry stools; red or dark brown urine; spitting up blood or brown material that looks like coffee grounds; red spots on the skin; unusual bruising or bleeding from the eyes, gums, or nose signs and symptoms of a dangerous change in heartbeat or heart rhythm like chest pain; dizziness; fast, irregular heartbeat; palpitations; feeling faint or lightheaded; falls signs and symptoms of infection like fever; chills; cough; sore throat; pain or trouble passing urine signs and symptoms of liver injury like dark yellow or brown urine; general ill feeling or flu-like symptoms; light-colored stools; loss of appetite; nausea; right upper belly pain; unusually weak or tired; yellowing of the eyes or skin signs and symptoms of  low red blood cells or anemia such as unusually weak or tired; feeling faint or lightheaded; falls signs and symptoms of muscle injury like dark urine; trouble passing urine or change in the amount of urine; unusually weak or tired; muscle pain; back pain Side effects that usually do not require medical attention (report to your doctor or health care professional if they continue or are bothersome): changes in taste diarrhea gas hair loss loss of appetite mouth sores This list may not describe all possible side effects. Call your doctor for medical advice about side effects. You may report side effects to FDA at 1-800-FDA-1088. Where should I keep my medication? This drug is given in a hospital or clinic and will not be stored at home. NOTE: This sheet is a summary. It may not cover all possible information. If you have questions about this medicine, talk to your doctor,  pharmacist, or health care provider.  2022 [employer] Standard (2020-11-16 00:00:00)  Leucovorin injection What is this medication? LEUCOVORIN (loo koe VOR in) is used to prevent or treat the harmful effects of some medicines. This medicine is used to treat anemia caused by a low amount of folic acid in the body. It is also used with 5-fluorouracil (5-FU) to treat colon cancer. This medicine may be used for other purposes; ask your health care provider or pharmacist if you have questions. What should I tell my care team before I take this medication? They need to know if you have any of these conditions: anemia from low levels of vitamin B-12 in the blood an unusual or allergic reaction to leucovorin, folic acid, other medicines, foods, dyes, or preservatives pregnant or trying to get pregnant breast-feeding How should I use this medication? This medicine is for injection into a muscle or into a vein. It is given by a health care professional in a hospital or clinic setting. Talk to your pediatrician regarding the use of this medicine in children. Special care may be needed. Overdosage: If you think you have taken too much of this medicine contact a poison control center or emergency room at once. NOTE: This medicine is only for you. Do not share this medicine with others. What if I miss a dose? This does not apply. What may interact with this medication? capecitabine fluorouracil phenobarbital phenytoin primidone trimethoprim-sulfamethoxazole This list may not describe all possible interactions. Give your health care provider a list of all the medicines, herbs, non-prescription drugs, or dietary supplements you use. Also tell them if you smoke, drink alcohol, or use illegal drugs. Some items may interact with your medicine. What should I watch for while using this medication? Your condition will be monitored carefully while you are receiving this medicine. This medicine may  increase the side effects of 5-fluorouracil, 5-FU. Tell your doctor or health care professional if you have diarrhea or mouth sores that do not get better or that get worse. What side effects may I notice from receiving this medication? Side effects that you should report to your doctor or health care professional as soon as possible: allergic reactions like skin rash, itching or hives, swelling of the face, lips, or tongue breathing problems fever, infection mouth sores unusual bleeding or bruising unusually weak or tired Side effects that usually do not require medical attention (report to your doctor or health care professional if they continue or are bothersome): constipation or diarrhea loss of appetite nausea, vomiting This list may not describe all possible side effects. Call your doctor for medical  advice about side effects. You may report side effects to FDA at 1-800-FDA-1088. Where should I keep my medication? This drug is given in a hospital or clinic and will not be stored at home. NOTE: This sheet is a summary. It may not cover all possible information. If you have questions about this medicine, talk to your doctor, pharmacist, or health care provider.  2022 [employer]/Gold Standard (2007-09-05 00:00:00)  Fluorouracil, 5-FU injection What is this medication? FLUOROURACIL, 5-FU (flure oh YOOR a sil) is a chemotherapy drug. It slows the growth of cancer cells. This medicine is used to treat many types of cancer like breast cancer, colon or rectal cancer, pancreatic cancer, and stomach cancer. This medicine may be used for other purposes; ask your health care provider or pharmacist if you have questions. COMMON BRAND NAME(S): Adrucil What should I tell my care team before I take this medication? They need to know if you have any of these conditions: blood disorders dihydropyrimidine dehydrogenase (DPD) deficiency infection (especially a virus infection such as chickenpox, cold  sores, or herpes) kidney disease liver disease malnourished, poor nutrition recent or ongoing radiation therapy an unusual or allergic reaction to fluorouracil, other chemotherapy, other medicines, foods, dyes, or preservatives pregnant or trying to get pregnant breast-feeding How should I use this medication? This drug is given as an infusion or injection into a vein. It is administered in a hospital or clinic by a specially trained health care professional. Talk to your pediatrician regarding the use of this medicine in children. Special care may be needed. Overdosage: If you think you have taken too much of this medicine contact a poison control center or emergency room at once. NOTE: This medicine is only for you. Do not share this medicine with others. What if I miss a dose? It is important not to miss your dose. Call your doctor or health care professional if you are unable to keep an appointment. What may interact with this medication? Do not take this medicine with any of the following medications: live virus vaccines This medicine may also interact with the following medications: medicines that treat or prevent blood clots like warfarin, enoxaparin, and dalteparin This list may not describe all possible interactions. Give your health care provider a list of all the medicines, herbs, non-prescription drugs, or dietary supplements you use. Also tell them if you smoke, drink alcohol, or use illegal drugs. Some items may interact with your medicine. What should I watch for while using this medication? Visit your doctor for checks on your progress. This drug may make you feel generally unwell. This is not uncommon, as chemotherapy can affect healthy cells as well as cancer cells. Report any side effects. Continue your course of treatment even though you feel ill unless your doctor tells you to stop. In some cases, you may be given additional medicines to help with side effects. Follow all  directions for their use. Call your doctor or health care professional for advice if you get a fever, chills or sore throat, or other symptoms of a cold or flu. Do not treat yourself. This drug decreases your body's ability to fight infections. Try to avoid being around people who are sick. This medicine may increase your risk to bruise or bleed. Call your doctor or health care professional if you notice any unusual bleeding. Be careful brushing and flossing your teeth or using a toothpick because you may get an infection or bleed more easily. If you have any dental work done,  tell your dentist you are receiving this medicine. Avoid taking products that contain aspirin, acetaminophen, ibuprofen, naproxen, or ketoprofen unless instructed by your doctor. These medicines may hide a fever. Do not become pregnant while taking this medicine. Women should inform their doctor if they wish to become pregnant or think they might be pregnant. There is a potential for serious side effects to an unborn child. Talk to your health care professional or pharmacist for more information. Do not breast-feed an infant while taking this medicine. Men should inform their doctor if they wish to father a child. This medicine may lower sperm counts. Do not treat diarrhea with over the counter products. Contact your doctor if you have diarrhea that lasts more than 2 days or if it is severe and watery. This medicine can make you more sensitive to the sun. Keep out of the sun. If you cannot avoid being in the sun, wear protective clothing and use sunscreen. Do not use sun lamps or tanning beds/booths. What side effects may I notice from receiving this medication? Side effects that you should report to your doctor or health care professional as soon as possible: allergic reactions like skin rash, itching or hives, swelling of the face, lips, or tongue low blood counts - this medicine may decrease the number of white blood cells, red  blood cells and platelets. You may be at increased risk for infections and bleeding. signs of infection - fever or chills, cough, sore throat, pain or difficulty passing urine signs of decreased platelets or bleeding - bruising, pinpoint red spots on the skin, black, tarry stools, blood in the urine signs of decreased red blood cells - unusually weak or tired, fainting spells, lightheadedness breathing problems changes in vision chest pain mouth sores nausea and vomiting pain, swelling, redness at site where injected pain, tingling, numbness in the hands or feet redness, swelling, or sores on hands or feet stomach pain unusual bleeding Side effects that usually do not require medical attention (report to your doctor or health care professional if they continue or are bothersome): changes in finger or toe nails diarrhea dry or itchy skin hair loss headache loss of appetite sensitivity of eyes to the light stomach upset unusually teary eyes This list may not describe all possible side effects. Call your doctor for medical advice about side effects. You may report side effects to FDA at 1-800-FDA-1088. Where should I keep my medication? This drug is given in a hospital or clinic and will not be stored at home. NOTE: This sheet is a summary. It may not cover all possible information. If you have questions about this medicine, talk to your doctor, pharmacist, or health care provider.  2022 [employer]/Gold Standard (2020-11-16 00:00:00)

## 2021-02-09 ENCOUNTER — Inpatient Hospital Stay: Payer: 59

## 2021-02-09 ENCOUNTER — Other Ambulatory Visit: Payer: Self-pay

## 2021-02-09 VITALS — BP 127/92 | HR 60 | Temp 98.0°F | Resp 20

## 2021-02-09 DIAGNOSIS — Z5111 Encounter for antineoplastic chemotherapy: Secondary | ICD-10-CM | POA: Diagnosis not present

## 2021-02-09 DIAGNOSIS — C184 Malignant neoplasm of transverse colon: Secondary | ICD-10-CM

## 2021-02-09 MED ORDER — SODIUM CHLORIDE 0.9% FLUSH
10.0000 mL | INTRAVENOUS | Status: DC | PRN
  Administered 2021-02-09: 10 mL

## 2021-02-09 MED ORDER — HEPARIN SOD (PORK) LOCK FLUSH 100 UNIT/ML IV SOLN
500.0000 [IU] | Freq: Once | INTRAVENOUS | Status: AC | PRN
  Administered 2021-02-09: 500 [IU]

## 2021-02-09 NOTE — Patient Instructions (Signed)
[facility]  Discharge Instructions: Thank you for choosing [facility] to provide your oncology and hematology care.   If you have a lab appointment with the [facility], please go directly to the [facility] and check in at the registration area.   Wear comfortable clothing and clothing appropriate for easy access to any Portacath or PICC line.   We strive to give you quality time with your provider. You may need to reschedule your appointment if you arrive late (15 or more minutes).  Arriving late affects you and other patients whose appointments are after yours.  Also, if you miss three or more appointments without notifying the office, you may be dismissed from the clinic at the provider's discretion.      For prescription refill requests, have your pharmacy contact our office and allow 72 hours for refills to be completed.    Today you received the following chemotherapy and/or immunotherapy agents 70f  Fluorouracil, 5-FU injection What is this medication? FLUOROURACIL, 5-FU (flure oh YOOR a sil) is a chemotherapy drug. It slows the growth of cancer cells. This medicine is used to treat many types of cancer like breast cancer, colon or rectal cancer, pancreatic cancer, and stomach cancer. This medicine may be used for other purposes; ask your health care provider or pharmacist if you have questions. COMMON BRAND NAME(S): Adrucil What should I tell my care team before I take this medication? They need to know if you have any of these conditions: blood disorders dihydropyrimidine dehydrogenase (DPD) deficiency infection (especially a virus infection such as chickenpox, cold sores, or herpes) kidney disease liver disease malnourished, poor nutrition recent or ongoing radiation therapy an unusual or allergic reaction to fluorouracil, other chemotherapy, other medicines, foods, dyes, or preservatives pregnant or trying to get  pregnant breast-feeding How should I use this medication? This drug is given as an infusion or injection into a vein. It is administered in a hospital or clinic by a specially trained health care professional. Talk to your pediatrician regarding the use of this medicine in children. Special care may be needed. Overdosage: If you think you have taken too much of this medicine contact a poison control center or emergency room at once. NOTE: This medicine is only for you. Do not share this medicine with others. What if I miss a dose? It is important not to miss your dose. Call your doctor or health care professional if you are unable to keep an appointment. What may interact with this medication? Do not take this medicine with any of the following medications: live virus vaccines This medicine may also interact with the following medications: medicines that treat or prevent blood clots like warfarin, enoxaparin, and dalteparin This list may not describe all possible interactions. Give your health care provider a list of all the medicines, herbs, non-prescription drugs, or dietary supplements you use. Also tell them if you smoke, drink alcohol, or use illegal drugs. Some items may interact with your medicine. What should I watch for while using this medication? Visit your doctor for checks on your progress. This drug may make you feel generally unwell. This is not uncommon, as chemotherapy can affect healthy cells as well as cancer cells. Report any side effects. Continue your course of treatment even though you feel ill unless your doctor tells you to stop. In some cases, you may be given additional medicines to help with side effects. Follow all directions for their use. Call your  doctor or health care professional for advice if you get a fever, chills or sore throat, or other symptoms of a cold or flu. Do not treat yourself. This drug decreases your body's ability to fight infections. Try to avoid  being around people who are sick. This medicine may increase your risk to bruise or bleed. Call your doctor or health care professional if you notice any unusual bleeding. Be careful brushing and flossing your teeth or using a toothpick because you may get an infection or bleed more easily. If you have any dental work done, tell your dentist you are receiving this medicine. Avoid taking products that contain aspirin, acetaminophen, ibuprofen, naproxen, or ketoprofen unless instructed by your doctor. These medicines may hide a fever. Do not become pregnant while taking this medicine. Women should inform their doctor if they wish to become pregnant or think they might be pregnant. There is a potential for serious side effects to an unborn child. Talk to your health care professional or pharmacist for more information. Do not breast-feed an infant while taking this medicine. Men should inform their doctor if they wish to father a child. This medicine may lower sperm counts. Do not treat diarrhea with over the counter products. Contact your doctor if you have diarrhea that lasts more than 2 days or if it is severe and watery. This medicine can make you more sensitive to the sun. Keep out of the sun. If you cannot avoid being in the sun, wear protective clothing and use sunscreen. Do not use sun lamps or tanning beds/booths. What side effects may I notice from receiving this medication? Side effects that you should report to your doctor or health care professional as soon as possible: allergic reactions like skin rash, itching or hives, swelling of the face, lips, or tongue low blood counts - this medicine may decrease the number of white blood cells, red blood cells and platelets. You may be at increased risk for infections and bleeding. signs of infection - fever or chills, cough, sore throat, pain or difficulty passing urine signs of decreased platelets or bleeding - bruising, pinpoint red spots on the  skin, black, tarry stools, blood in the urine signs of decreased red blood cells - unusually weak or tired, fainting spells, lightheadedness breathing problems changes in vision chest pain mouth sores nausea and vomiting pain, swelling, redness at site where injected pain, tingling, numbness in the hands or feet redness, swelling, or sores on hands or feet stomach pain unusual bleeding Side effects that usually do not require medical attention (report to your doctor or health care professional if they continue or are bothersome): changes in finger or toe nails diarrhea dry or itchy skin hair loss headache loss of appetite sensitivity of eyes to the light stomach upset unusually teary eyes This list may not describe all possible side effects. Call your doctor for medical advice about side effects. You may report side effects to FDA at 1-800-FDA-1088. Where should I keep my medication? This drug is given in a hospital or clinic and will not be stored at home. NOTE: This sheet is a summary. It may not cover all possible information. If you have questions about this medicine, talk to your doctor, pharmacist, or health care provider.  2022 [employer]vier/Gold Standard (2020-11-16 00:00:00)       To help prevent nausea and vomiting after your treatment, we encourage you to take your nausea medication as directed.  BELOW ARE SYMPTOMS THAT SHOULD BE REPORTED IMMEDIATELY: *FEVER  GREATER THAN 100.4 F (38 C) OR HIGHER *CHILLS OR SWEATING *NAUSEA AND VOMITING THAT IS NOT CONTROLLED WITH YOUR NAUSEA MEDICATION *UNUSUAL SHORTNESS OF BREATH *UNUSUAL BRUISING OR BLEEDING *URINARY PROBLEMS (pain or burning when urinating, or frequent urination) *BOWEL PROBLEMS (unusual diarrhea, constipation, pain near the anus) TENDERNESS IN MOUTH AND THROAT WITH OR WITHOUT PRESENCE OF ULCERS (sore throat, sores in mouth, or a toothache) UNUSUAL RASH, SWELLING OR PAIN  UNUSUAL VAGINAL DISCHARGE OR ITCHING    Items with * indicate a potential emergency and should be followed up as soon as possible or go to the Emergency Department if any problems should occur.  Please show the CHEMOTHERAPY ALERT CARD or IMMUNOTHERAPY ALERT CARD at check-in to the Emergency Department and triage nurse.  Should you have questions after your visit or need to cancel or reschedule your appointment, please contact [facility]  Dept: [phone]  and follow the prompts.  Office hours are 8:00 a.m. to 4:30 p.m. Monday - Friday. Please note that voicemails left after 4:00 p.m. may not be returned until the following business day.  We are closed weekends and major holidays. You have access to a nurse at all times for urgent questions. Please call the main number to the clinic Dept: [phone] and follow the prompts.   For any non-urgent questions, you may also contact your provider using MyChart. We now offer e-Visits for anyone 65 and older to request care online for non-urgent symptoms. For details visit [email or web address].   Also download the MyChart app! Go to the app store, search "MyChart", open the app, select [facility], and log in with your MyChart username and password.  Due to Covid, a mask is required upon entering the hospital/clinic. If you do not have a mask, one will be given to you upon arrival. For doctor visits, patients may have 1 support person aged 31 or older with them. For treatment visits, patients cannot have anyone with them due to current Covid guidelines and our immunocompromised population.

## 2021-02-10 ENCOUNTER — Other Ambulatory Visit: Payer: Self-pay | Admitting: *Deleted

## 2021-02-10 DIAGNOSIS — C184 Malignant neoplasm of transverse colon: Secondary | ICD-10-CM

## 2021-02-10 NOTE — Progress Notes (Signed)
Per [facility] team: Patient is due his [place] #2 testing. Will collect on 02/16/21

## 2021-02-14 ENCOUNTER — Inpatient Hospital Stay: Payer: 59 | Admitting: Oncology

## 2021-02-14 ENCOUNTER — Other Ambulatory Visit: Payer: 59

## 2021-02-14 ENCOUNTER — Inpatient Hospital Stay: Payer: 59

## 2021-02-14 ENCOUNTER — Encounter: Payer: Self-pay | Admitting: Oncology

## 2021-02-16 ENCOUNTER — Inpatient Hospital Stay: Payer: 59 | Attending: Nurse Practitioner

## 2021-02-16 ENCOUNTER — Inpatient Hospital Stay: Payer: 59

## 2021-02-16 ENCOUNTER — Other Ambulatory Visit: Payer: Self-pay

## 2021-02-16 VITALS — BP 155/101 | HR 64 | Temp 98.7°F | Resp 18

## 2021-02-16 DIAGNOSIS — C184 Malignant neoplasm of transverse colon: Secondary | ICD-10-CM | POA: Diagnosis present

## 2021-02-16 DIAGNOSIS — Z79899 Other long term (current) drug therapy: Secondary | ICD-10-CM | POA: Diagnosis not present

## 2021-02-16 DIAGNOSIS — K529 Noninfective gastroenteritis and colitis, unspecified: Secondary | ICD-10-CM | POA: Diagnosis not present

## 2021-02-16 DIAGNOSIS — Z5111 Encounter for antineoplastic chemotherapy: Secondary | ICD-10-CM | POA: Insufficient documentation

## 2021-02-16 DIAGNOSIS — Z452 Encounter for adjustment and management of vascular access device: Secondary | ICD-10-CM

## 2021-02-16 DIAGNOSIS — I10 Essential (primary) hypertension: Secondary | ICD-10-CM | POA: Insufficient documentation

## 2021-02-16 DIAGNOSIS — Z23 Encounter for immunization: Secondary | ICD-10-CM | POA: Diagnosis not present

## 2021-02-16 MED ORDER — SODIUM CHLORIDE 0.9% FLUSH
10.0000 mL | Freq: Once | INTRAVENOUS | Status: AC
  Administered 2021-02-16: 10 mL via INTRAVENOUS

## 2021-02-16 MED ORDER — HEPARIN SOD (PORK) LOCK FLUSH 100 UNIT/ML IV SOLN
500.0000 [IU] | Freq: Once | INTRAVENOUS | Status: AC
  Administered 2021-02-16: 250 [IU] via INTRAVENOUS

## 2021-02-19 ENCOUNTER — Other Ambulatory Visit: Payer: Self-pay | Admitting: Oncology

## 2021-02-21 ENCOUNTER — Inpatient Hospital Stay (HOSPITAL_BASED_OUTPATIENT_CLINIC_OR_DEPARTMENT_OTHER): Payer: 59 | Admitting: Oncology

## 2021-02-21 ENCOUNTER — Telehealth: Payer: Self-pay

## 2021-02-21 ENCOUNTER — Other Ambulatory Visit: Payer: Self-pay

## 2021-02-21 ENCOUNTER — Inpatient Hospital Stay: Payer: 59

## 2021-02-21 VITALS — BP 112/81 | HR 66 | Temp 97.8°F | Resp 18 | Ht 76.0 in | Wt 209.2 lb

## 2021-02-21 DIAGNOSIS — C189 Malignant neoplasm of colon, unspecified: Secondary | ICD-10-CM

## 2021-02-21 DIAGNOSIS — C184 Malignant neoplasm of transverse colon: Secondary | ICD-10-CM | POA: Diagnosis not present

## 2021-02-21 DIAGNOSIS — Z5111 Encounter for antineoplastic chemotherapy: Secondary | ICD-10-CM | POA: Diagnosis not present

## 2021-02-21 LAB — CBC WITH DIFFERENTIAL (CANCER CENTER ONLY)
Abs Immature Granulocytes: 0.03 10*3/uL (ref 0.00–0.07)
Basophils Absolute: 0 10*3/uL (ref 0.0–0.1)
Basophils Relative: 1 %
Eosinophils Absolute: 0.1 10*3/uL (ref 0.0–0.5)
Eosinophils Relative: 2 %
HCT: 41.2 % (ref 39.0–52.0)
Hemoglobin: 13.6 g/dL (ref 13.0–17.0)
Immature Granulocytes: 1 %
Lymphocytes Relative: 18 %
Lymphs Abs: 1.1 10*3/uL (ref 0.7–4.0)
MCH: 28.3 pg (ref 26.0–34.0)
MCHC: 33 g/dL (ref 30.0–36.0)
MCV: 85.8 fL (ref 80.0–100.0)
Monocytes Absolute: 0.6 10*3/uL (ref 0.1–1.0)
Monocytes Relative: 11 %
Neutro Abs: 4 10*3/uL (ref 1.7–7.7)
Neutrophils Relative %: 67 %
Platelet Count: 175 10*3/uL (ref 150–400)
RBC: 4.8 MIL/uL (ref 4.22–5.81)
RDW: 16.4 % — ABNORMAL HIGH (ref 11.5–15.5)
WBC Count: 5.9 10*3/uL (ref 4.0–10.5)
nRBC: 0 % (ref 0.0–0.2)

## 2021-02-21 LAB — CMP (CANCER CENTER ONLY)
ALT: 22 U/L (ref 0–44)
AST: 25 U/L (ref 15–41)
Albumin: 4.2 g/dL (ref 3.5–5.0)
Alkaline Phosphatase: 53 U/L (ref 38–126)
Anion gap: 7 (ref 5–15)
BUN: 14 mg/dL (ref 8–23)
CO2: 29 mmol/L (ref 22–32)
Calcium: 9.2 mg/dL (ref 8.9–10.3)
Chloride: 102 mmol/L (ref 98–111)
Creatinine: 0.78 mg/dL (ref 0.61–1.24)
GFR, Estimated: >60 mL/min (ref >60)
Glucose, Bld: 115 mg/dL — ABNORMAL HIGH (ref 70–99)
Potassium: 3.8 mmol/L (ref 3.5–5.1)
Sodium: 138 mmol/L (ref 135–145)
Total Bilirubin: 0.6 mg/dL (ref 0.3–1.2)
Total Protein: 6.5 g/dL (ref 6.5–8.1)

## 2021-02-21 MED ORDER — LEUCOVORIN CALCIUM INJECTION 350 MG
200.0000 mg/m2 | Freq: Once | INTRAVENOUS | Status: AC
  Administered 2021-02-21: 444 mg via INTRAVENOUS
  Filled 2021-02-21: qty 22.2

## 2021-02-21 MED ORDER — PALONOSETRON HCL INJECTION 0.25 MG/5ML
0.2500 mg | Freq: Once | INTRAVENOUS | Status: AC
  Administered 2021-02-21: 0.25 mg via INTRAVENOUS
  Filled 2021-02-21: qty 5

## 2021-02-21 MED ORDER — DEXTROSE 5 % IV SOLN: Freq: Once | INTRAVENOUS | Status: AC

## 2021-02-21 MED ORDER — OXALIPLATIN CHEMO INJECTION 100 MG/20ML
65.0000 mg/m2 | Freq: Once | INTRAVENOUS | Status: AC
  Administered 2021-02-21: 145 mg via INTRAVENOUS
  Filled 2021-02-21: qty 20

## 2021-02-21 MED ORDER — SODIUM CHLORIDE 0.9 % IV SOLN
10.0000 mg | Freq: Once | INTRAVENOUS | Status: AC
  Administered 2021-02-21: 10 mg via INTRAVENOUS
  Filled 2021-02-21: qty 1

## 2021-02-21 MED ORDER — SODIUM CHLORIDE 0.9 % IV SOLN
150.0000 mg | Freq: Once | INTRAVENOUS | Status: AC
  Administered 2021-02-21: 150 mg via INTRAVENOUS
  Filled 2021-02-21: qty 5

## 2021-02-21 MED ORDER — SODIUM CHLORIDE 0.9 % IV SOLN
1800.0000 mg/m2 | INTRAVENOUS | Status: DC
  Administered 2021-02-21: 4000 mg via INTRAVENOUS
  Filled 2021-02-21: qty 80

## 2021-02-21 MED ORDER — FLUOROURACIL CHEMO INJECTION 500 MG/10ML
200.0000 mg/m2 | Freq: Once | INTRAVENOUS | Status: AC
  Administered 2021-02-21: 450 mg via INTRAVENOUS
  Filled 2021-02-21: qty 9

## 2021-02-21 NOTE — Patient Instructions (Signed)
[facility]   Discharge Instructions: Thank you for choosing [facility] to provide your oncology and hematology care.   If you have a lab appointment with the [facility], please go directly to the [facility] and check in at the registration area.   Wear comfortable clothing and clothing appropriate for easy access to any Portacath or PICC line.   We strive to give you quality time with your provider. You may need to reschedule your appointment if you arrive late (15 or more minutes).  Arriving late affects you and other patients whose appointments are after yours.  Also, if you miss three or more appointments without notifying the office, you may be dismissed from the clinic at the provider's discretion.      For prescription refill requests, have your pharmacy contact our office and allow 72 hours for refills to be completed.    Today you received the following chemotherapy and/or immunotherapy agents Oxaliplatin (ELOXATIN), Leucovorin & Flourouracil (ADRUCIL).      To help prevent nausea and vomiting after your treatment, we encourage you to take your nausea medication as directed.  BELOW ARE SYMPTOMS THAT SHOULD BE REPORTED IMMEDIATELY: *FEVER GREATER THAN 100.4 F (38 C) OR HIGHER *CHILLS OR SWEATING *NAUSEA AND VOMITING THAT IS NOT CONTROLLED WITH YOUR NAUSEA MEDICATION *UNUSUAL SHORTNESS OF BREATH *UNUSUAL BRUISING OR BLEEDING *URINARY PROBLEMS (pain or burning when urinating, or frequent urination) *BOWEL PROBLEMS (unusual diarrhea, constipation, pain near the anus) TENDERNESS IN MOUTH AND THROAT WITH OR WITHOUT PRESENCE OF ULCERS (sore throat, sores in mouth, or a toothache) UNUSUAL RASH, SWELLING OR PAIN  UNUSUAL VAGINAL DISCHARGE OR ITCHING   Items with * indicate a potential emergency and should be followed up as soon as possible or go to the Emergency Department if any problems should occur.  Please show the CHEMOTHERAPY ALERT  CARD or IMMUNOTHERAPY ALERT CARD at check-in to the Emergency Department and triage nurse.  Should you have questions after your visit or need to cancel or reschedule your appointment, please contact [facility]  Dept: [phone]  and follow the prompts.  Office hours are 8:00 a.m. to 4:30 p.m. Monday - Friday. Please note that voicemails left after 4:00 p.m. may not be returned until the following business day.  We are closed weekends and major holidays. You have access to a nurse at all times for urgent questions. Please call the main number to the clinic Dept: [phone] and follow the prompts.   For any non-urgent questions, you may also contact your provider using MyChart. We now offer e-Visits for anyone 71 and older to request care online for non-urgent symptoms. For details visit [email or web address].   Also download the MyChart app! Go to the app store, search "MyChart", open the app, select [facility], and log in with your MyChart username and password.  Due to Covid, a mask is required upon entering the hospital/clinic. If you do not have a mask, one will be given to you upon arrival. For doctor visits, patients may have 1 support person aged 13 or older with them. For treatment visits, patients cannot have anyone with them due to current Covid guidelines and our immunocompromised population.   Oxaliplatin Injection What is this medication? OXALIPLATIN (ox AL i PLA tin) is a chemotherapy drug. It targets fast dividing cells, like cancer cells, and causes these cells to die. This medicine is used to treat cancers of the colon and rectum, and  many other cancers. This medicine may be used for other purposes; ask your health care provider or pharmacist if you have questions. COMMON BRAND NAME(S): Eloxatin What should I tell my care team before I take this medication? They need to know if you have any of these conditions: heart disease history of irregular  heartbeat liver disease low blood counts, like white cells, platelets, or red blood cells lung or breathing disease, like asthma take medicines that treat or prevent blood clots tingling of the fingers or toes, or other nerve disorder an unusual or allergic reaction to oxaliplatin, other chemotherapy, other medicines, foods, dyes, or preservatives pregnant or trying to get pregnant breast-feeding How should I use this medication? This drug is given as an infusion into a vein. It is administered in a hospital or clinic by a specially trained health care professional. Talk to your pediatrician regarding the use of this medicine in children. Special care may be needed. Overdosage: If you think you have taken too much of this medicine contact a poison control center or emergency room at once. NOTE: This medicine is only for you. Do not share this medicine with others. What if I miss a dose? It is important not to miss a dose. Call your doctor or health care professional if you are unable to keep an appointment. What may interact with this medication? Do not take this medicine with any of the following medications: cisapride dronedarone pimozide thioridazine This medicine may also interact with the following medications: aspirin and aspirin-like medicines certain medicines that treat or prevent blood clots like warfarin, apixaban, dabigatran, and rivaroxaban cisplatin cyclosporine diuretics medicines for infection like acyclovir, adefovir, amphotericin B, bacitracin, cidofovir, foscarnet, ganciclovir, gentamicin, pentamidine, vancomycin NSAIDs, medicines for pain and inflammation, like ibuprofen or naproxen other medicines that prolong the QT interval (an abnormal heart rhythm) pamidronate zoledronic acid This list may not describe all possible interactions. Give your health care provider a list of all the medicines, herbs, non-prescription drugs, or dietary supplements you use. Also tell  them if you smoke, drink alcohol, or use illegal drugs. Some items may interact with your medicine. What should I watch for while using this medication? Your condition will be monitored carefully while you are receiving this medicine. You may need blood work done while you are taking this medicine. This medicine may make you feel generally unwell. This is not uncommon as chemotherapy can affect healthy cells as well as cancer cells. Report any side effects. Continue your course of treatment even though you feel ill unless your healthcare professional tells you to stop. This medicine can make you more sensitive to cold. Do not drink cold drinks or use ice. Cover exposed skin before coming in contact with cold temperatures or cold objects. When out in cold weather wear warm clothing and cover your mouth and nose to warm the air that goes into your lungs. Tell your doctor if you get sensitive to the cold. Do not become pregnant while taking this medicine or for 9 months after stopping it. Women should inform their health care professional if they wish to become pregnant or think they might be pregnant. Men should not father a child while taking this medicine and for 6 months after stopping it. There is potential for serious side effects to an unborn child. Talk to your health care professional for more information. Do not breast-feed a child while taking this medicine or for 3 months after stopping it. This medicine has caused ovarian failure in  some women. This medicine may make it more difficult to get pregnant. Talk to your health care professional if you are concerned about your fertility. This medicine has caused decreased sperm counts in some men. This may make it more difficult to father a child. Talk to your health care professional if you are concerned about your fertility. This medicine may increase your risk of getting an infection. Call your health care professional for advice if you get a fever,  chills, or sore throat, or other symptoms of a cold or flu. Do not treat yourself. Try to avoid being around people who are sick. Avoid taking medicines that contain aspirin, acetaminophen, ibuprofen, naproxen, or ketoprofen unless instructed by your health care professional. These medicines may hide a fever. Be careful brushing or flossing your teeth or using a toothpick because you may get an infection or bleed more easily. If you have any dental work done, tell your dentist you are receiving this medicine. What side effects may I notice from receiving this medication? Side effects that you should report to your doctor or health care professional as soon as possible: allergic reactions like skin rash, itching or hives, swelling of the face, lips, or tongue breathing problems cough low blood counts - this medicine may decrease the number of white blood cells, red blood cells, and platelets. You may be at increased risk for infections and bleeding nausea, vomiting pain, redness, or irritation at site where injected pain, tingling, numbness in the hands or feet signs and symptoms of bleeding such as bloody or black, tarry stools; red or dark brown urine; spitting up blood or brown material that looks like coffee grounds; red spots on the skin; unusual bruising or bleeding from the eyes, gums, or nose signs and symptoms of a dangerous change in heartbeat or heart rhythm like chest pain; dizziness; fast, irregular heartbeat; palpitations; feeling faint or lightheaded; falls signs and symptoms of infection like fever; chills; cough; sore throat; pain or trouble passing urine signs and symptoms of liver injury like dark yellow or brown urine; general ill feeling or flu-like symptoms; light-colored stools; loss of appetite; nausea; right upper belly pain; unusually weak or tired; yellowing of the eyes or skin signs and symptoms of low red blood cells or anemia such as unusually weak or tired; feeling faint  or lightheaded; falls signs and symptoms of muscle injury like dark urine; trouble passing urine or change in the amount of urine; unusually weak or tired; muscle pain; back pain Side effects that usually do not require medical attention (report to your doctor or health care professional if they continue or are bothersome): changes in taste diarrhea gas hair loss loss of appetite mouth sores This list may not describe all possible side effects. Call your doctor for medical advice about side effects. You may report side effects to FDA at 1-800-FDA-1088. Where should I keep my medication? This drug is given in a hospital or clinic and will not be stored at home. NOTE: This sheet is a summary. It may not cover all possible information. If you have questions about this medicine, talk to your doctor, pharmacist, or health care provider.  2022 Elsevier/Gold Standard (2020-11-16 00:00:00)  Leucovorin injection What is this medication? LEUCOVORIN (loo koe VOR in) is used to prevent or treat the harmful effects of some medicines. This medicine is used to treat anemia caused by a low amount of folic acid in the body. It is also used with 5-fluorouracil (5-FU) to treat colon  cancer. This medicine may be used for other purposes; ask your health care provider or pharmacist if you have questions. What should I tell my care team before I take this medication? They need to know if you have any of these conditions: anemia from low levels of vitamin B-12 in the blood an unusual or allergic reaction to leucovorin, folic acid, other medicines, foods, dyes, or preservatives pregnant or trying to get pregnant breast-feeding How should I use this medication? This medicine is for injection into a muscle or into a vein. It is given by a health care professional in a hospital or clinic setting. Talk to your pediatrician regarding the use of this medicine in children. Special care may be needed. Overdosage: If you  think you have taken too much of this medicine contact a poison control center or emergency room at once. NOTE: This medicine is only for you. Do not share this medicine with others. What if I miss a dose? This does not apply. What may interact with this medication? capecitabine fluorouracil phenobarbital phenytoin primidone trimethoprim-sulfamethoxazole This list may not describe all possible interactions. Give your health care provider a list of all the medicines, herbs, non-prescription drugs, or dietary supplements you use. Also tell them if you smoke, drink alcohol, or use illegal drugs. Some items may interact with your medicine. What should I watch for while using this medication? Your condition will be monitored carefully while you are receiving this medicine. This medicine may increase the side effects of 5-fluorouracil, 5-FU. Tell your doctor or health care professional if you have diarrhea or mouth sores that do not get better or that get worse. What side effects may I notice from receiving this medication? Side effects that you should report to your doctor or health care professional as soon as possible: allergic reactions like skin rash, itching or hives, swelling of the face, lips, or tongue breathing problems fever, infection mouth sores unusual bleeding or bruising unusually weak or tired Side effects that usually do not require medical attention (report to your doctor or health care professional if they continue or are bothersome): constipation or diarrhea loss of appetite nausea, vomiting This list may not describe all possible side effects. Call your doctor for medical advice about side effects. You may report side effects to FDA at 1-800-FDA-1088. Where should I keep my medication? This drug is given in a hospital or clinic and will not be stored at home. NOTE: This sheet is a summary. It may not cover all possible information. If you have questions about this  medicine, talk to your doctor, pharmacist, or health care provider.  2022 Elsevier/Gold Standard (2007-09-05 00:00:00)  Fluorouracil, 5-FU injection What is this medication? FLUOROURACIL, 5-FU (flure oh YOOR a sil) is a chemotherapy drug. It slows the growth of cancer cells. This medicine is used to treat many types of cancer like breast cancer, colon or rectal cancer, pancreatic cancer, and stomach cancer. This medicine may be used for other purposes; ask your health care provider or pharmacist if you have questions. COMMON BRAND NAME(S): Adrucil What should I tell my care team before I take this medication? They need to know if you have any of these conditions: blood disorders dihydropyrimidine dehydrogenase (DPD) deficiency infection (especially a virus infection such as chickenpox, cold sores, or herpes) kidney disease liver disease malnourished, poor nutrition recent or ongoing radiation therapy an unusual or allergic reaction to fluorouracil, other chemotherapy, other medicines, foods, dyes, or preservatives pregnant or trying to get pregnant  breast-feeding How should I use this medication? This drug is given as an infusion or injection into a vein. It is administered in a hospital or clinic by a specially trained health care professional. Talk to your pediatrician regarding the use of this medicine in children. Special care may be needed. Overdosage: If you think you have taken too much of this medicine contact a poison control center or emergency room at once. NOTE: This medicine is only for you. Do not share this medicine with others. What if I miss a dose? It is important not to miss your dose. Call your doctor or health care professional if you are unable to keep an appointment. What may interact with this medication? Do not take this medicine with any of the following medications: live virus vaccines This medicine may also interact with the following medications: medicines  that treat or prevent blood clots like warfarin, enoxaparin, and dalteparin This list may not describe all possible interactions. Give your health care provider a list of all the medicines, herbs, non-prescription drugs, or dietary supplements you use. Also tell them if you smoke, drink alcohol, or use illegal drugs. Some items may interact with your medicine. What should I watch for while using this medication? Visit your doctor for checks on your progress. This drug may make you feel generally unwell. This is not uncommon, as chemotherapy can affect healthy cells as well as cancer cells. Report any side effects. Continue your course of treatment even though you feel ill unless your doctor tells you to stop. In some cases, you may be given additional medicines to help with side effects. Follow all directions for their use. Call your doctor or health care professional for advice if you get a fever, chills or sore throat, or other symptoms of a cold or flu. Do not treat yourself. This drug decreases your body's ability to fight infections. Try to avoid being around people who are sick. This medicine may increase your risk to bruise or bleed. Call your doctor or health care professional if you notice any unusual bleeding. Be careful brushing and flossing your teeth or using a toothpick because you may get an infection or bleed more easily. If you have any dental work done, tell your dentist you are receiving this medicine. Avoid taking products that contain aspirin, acetaminophen, ibuprofen, naproxen, or ketoprofen unless instructed by your doctor. These medicines may hide a fever. Do not become pregnant while taking this medicine. Women should inform their doctor if they wish to become pregnant or think they might be pregnant. There is a potential for serious side effects to an unborn child. Talk to your health care professional or pharmacist for more information. Do not breast-feed an infant while taking  this medicine. Men should inform their doctor if they wish to father a child. This medicine may lower sperm counts. Do not treat diarrhea with over the counter products. Contact your doctor if you have diarrhea that lasts more than 2 days or if it is severe and watery. This medicine can make you more sensitive to the sun. Keep out of the sun. If you cannot avoid being in the sun, wear protective clothing and use sunscreen. Do not use sun lamps or tanning beds/booths. What side effects may I notice from receiving this medication? Side effects that you should report to your doctor or health care professional as soon as possible: allergic reactions like skin rash, itching or hives, swelling of the face, lips, or tongue low blood  counts - this medicine may decrease the number of white blood cells, red blood cells and platelets. You may be at increased risk for infections and bleeding. signs of infection - fever or chills, cough, sore throat, pain or difficulty passing urine signs of decreased platelets or bleeding - bruising, pinpoint red spots on the skin, black, tarry stools, blood in the urine signs of decreased red blood cells - unusually weak or tired, fainting spells, lightheadedness breathing problems changes in vision chest pain mouth sores nausea and vomiting pain, swelling, redness at site where injected pain, tingling, numbness in the hands or feet redness, swelling, or sores on hands or feet stomach pain unusual bleeding Side effects that usually do not require medical attention (report to your doctor or health care professional if they continue or are bothersome): changes in finger or toe nails diarrhea dry or itchy skin hair loss headache loss of appetite sensitivity of eyes to the light stomach upset unusually teary eyes This list may not describe all possible side effects. Call your doctor for medical advice about side effects. You may report side effects to FDA at  1-800-FDA-1088. Where should I keep my medication? This drug is given in a hospital or clinic and will not be stored at home. NOTE: This sheet is a summary. It may not cover all possible information. If you have questions about this medicine, talk to your doctor, pharmacist, or health care provider.  2022 Elsevier/Gold Standard (2020-11-16 00:00:00)  The chemotherapy medication bag should finish at 46 hours, 96 hours, or 7 days. For example, if your pump is scheduled for 46 hours and it was put on at 4:00 p.m., it should finish at 2:00 p.m. the day it is scheduled to come off regardless of your appointment time.     Estimated time to finish at 1:30 p.m. on Wednesday 02/23/2021.   If the display on your pump reads "Low Volume" and it is beeping, take the batteries out of the pump and come to the cancer center for it to be taken off.   If the pump alarms go off prior to the pump reading "Low Volume" then call [phone] and someone can assist you.  If the plunger comes out and the chemotherapy medication is leaking out, please use your home chemo spill kit to clean up the spill. Do NOT use paper towels or other household products.  If you have problems or questions regarding your pump, please call either [phone] (24 hours a day) or the cancer center Monday-Friday 8:00 a.m.- 4:30 p.m. at the clinic number and we will assist you. If you are unable to get assistance, then go to the nearest Emergency Department and ask the staff to contact the IV team for assistance.

## 2021-02-21 NOTE — Progress Notes (Signed)
Patient presents for treatment. RN assessment completed along with the following:  Labs/vitals reviewed - Yes, and within treatment parameters.   Weight within 10% of previous measurement - Yes Oncology Treatment Attestation completed for current therapy- Yes, on date 12/29/2020 Informed consent completed and reflects current therapy/intent - Yes, on date 01/24/2021             Provider progress note reviewed - Today's provider note is not yet available. I reviewed the most recent oncology provider progress note in chart dated 01/13/2021. Treatment/Antibody/Supportive plan reviewed - Yes, and there are no adjustments needed for today's treatment. S&H and other orders reviewed - Yes, and there are no additional orders identified. Previous treatment date reviewed - Yes, and the appropriate amount of time has elapsed between treatments. Clinic Hand Off Received from - [facility] Nurse Note  Patient to proceed with treatment.

## 2021-02-21 NOTE — Telephone Encounter (Signed)
Patient seen by Dr. Sherrill today ? ?Vitals are within treatment parameters. ? ?Labs reviewed by Dr. Sherrill and are within treatment parameters. ? ?Per physician team, patient is ready for treatment and there are NO modifications to the treatment plan.  ?

## 2021-02-21 NOTE — Progress Notes (Signed)
  [place] OFFICE PROGRESS NOTE   Diagnosis: Colon cancer  INTERVAL HISTORY:   Dylan Fischer completed another cycle of FOLFOX on 02/07/2021.  He reports nausea for 3 to 4 days following chemotherapy beginning on day 2.  No emesis.  He took Phenergan and Zofran for relief of nausea.  Cold sensitivity lasted for 6 days following chemotherapy.  No neuropathy symptoms at present.  No mouth sores or hand/foot skin changes.  Objective:  Vital signs in last 24 hours:  Blood pressure 112/81, pulse 66, temperature 97.8 F (36.6 C), temperature source Oral, resp. rate 18, height $RemoveBe'6\' 4"'CtinQHLXZ$  (1.93 m), weight 209 lb 3.2 oz (94.9 kg), SpO2 99 %.    HEENT: No thrush or ulcers Resp: Lungs clear bilaterally Cardio: Regular rate and rhythm GI: No hepatosplenomegaly, nontender, no mass Vascular: No leg edema    Portacath/PICC-without erythema  Lab Results:  Lab Results  Component Value Date   WBC 5.9 02/21/2021   HGB 13.6 02/21/2021   HCT 41.2 02/21/2021   MCV 85.8 02/21/2021   PLT 175 02/21/2021   NEUTROABS 4.0 02/21/2021    CMP  Lab Results  Component Value Date   NA 138 02/21/2021   K 3.8 02/21/2021   CL 102 02/21/2021   CO2 29 02/21/2021   GLUCOSE 115 (H) 02/21/2021   BUN 14 02/21/2021   CREATININE 0.78 02/21/2021   CALCIUM 9.2 02/21/2021   PROT 6.5 02/21/2021   ALBUMIN 4.2 02/21/2021   AST 25 02/21/2021   ALT 22 02/21/2021   ALKPHOS 53 02/21/2021   BILITOT 0.6 02/21/2021   GFRNONAA >60 02/21/2021   GFRAA 101 06/26/2019    Lab Results  Component Value Date   CEA 0.5 10/05/2020    Medications: I have reviewed the patient's current medications.   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, MSI-high, loss of MLH1 and PMS2 expression, negative for BRAF V600E mutation, MLH1  hypermethylation absent  Negative CancerNext panel-VUS in the [facility] gene colonoscopy 10/05/2020-near completely obstructing mass at the hepatic flexure-could not be passed, polyps in the sigmoid and transverse colon CTs 10/15/2020-short segment wall thickening with adjacent inflammatory stranding at the hepatic flexure, prominent mesenteric nodes adjacent to the colonic neoplasm measuring up to 5 mm, prominent gastropathic, hepatoduodenal ligament, and right sided retroperitoneal nodes measured up to 7 mm, subcentimeter hypodense hypodensities-too small to characterize, tiny right middle lobe nodule 12/13/2020-ctDNA not detected Cycle 1 CAPOX 12/13/2020, Xeloda discontinued around 12/24/2020 Cycle 1 FOLFOX 01/24/2021 Cycle 2 FOLFOX 02/07/2021 Cycle 4 FOLFOX 02/21/2021   Tubular adenoma on the colonoscopy 10/05/2020 and on colonoscopy 06/14/2010 Hypertension [facility] admission 12/30/2020-acute gastroenteritis/colitis Negative for the IVS14+ DPYD mutation    Disposition: Dylan Fischer has completed 2 cycles of FOLFOX.  He is tolerating FOLFOX well.  He has delayed nausea following chemotherapy.  He does not wish to begin Decadron prophylaxis at present.  He will let us know if he has nausea following this cycle of chemotherapy.  He will complete another cycle of FOLFOX today.  Dylan Fischer will return for an office visit and chemotherapy in 2 weeks.  Betsy Coder, MD  02/21/2021  11:33 AM

## 2021-02-23 ENCOUNTER — Other Ambulatory Visit: Payer: Self-pay

## 2021-02-23 ENCOUNTER — Inpatient Hospital Stay: Payer: 59

## 2021-02-23 VITALS — BP 105/75 | HR 60 | Temp 97.8°F | Resp 18

## 2021-02-23 DIAGNOSIS — Z5111 Encounter for antineoplastic chemotherapy: Secondary | ICD-10-CM | POA: Diagnosis not present

## 2021-02-23 DIAGNOSIS — C184 Malignant neoplasm of transverse colon: Secondary | ICD-10-CM

## 2021-02-23 MED ORDER — SODIUM CHLORIDE 0.9% FLUSH
10.0000 mL | INTRAVENOUS | Status: DC | PRN
  Administered 2021-02-23: 14:00:00 10 mL

## 2021-02-23 MED ORDER — HEPARIN SOD (PORK) LOCK FLUSH 100 UNIT/ML IV SOLN
500.0000 [IU] | Freq: Once | INTRAVENOUS | Status: AC | PRN
  Administered 2021-02-23: 14:00:00 500 [IU]

## 2021-02-23 NOTE — Patient Instructions (Signed)
PICC Home Care Guide A peripherally inserted central catheter (PICC) is a form of IV access that allows medicines and IV fluids to be quickly put into the blood and spread throughout the body. The PICC is a long, thin, flexible tube (catheter) that is put into a vein in a person's arm or leg. The catheter ends in a large vein just outside the heart called the superior vena cava (SVC). After the PICC is put in, a chest X-ray may be done to make sure that it is in the right place. A PICC may be placed for different reasons, such as: To give medicines and liquid nutrition. To give IV fluids and blood products. To take blood samples often. If there is trouble placing a peripheral intravenous (PIV) catheter. If cared for properly, a PICC can remain in place for many months. Having a PICC can allow you to go home from the hospital sooner and continue treatment at home. Medicines and PICC care can be managed at home by a family member, caregiver, or home health care team. What are the risks? Generally, having a PICC is safe. However, problems may occur, including: A blood clot (thrombus) forming in or at the end of the PICC. A blood clot forming in a vein (deep vein thrombosis) or traveling to the lung (pulmonary embolism). Inflammation of the vein (phlebitis) in which the PICC is placed. Infection at the insertion site or in the blood. Blood infections from central lines, like PICCs, can be serious and often require a hospital stay. PICC malposition, or PICC movement or poor placement. A break or cut in the PICC. Do not use scissors near the PICC. Nerve or tendon irritation or injury during PICC insertion. How to care for your PICC Please follow the specific guidelines provided by your health care provider. Preventing infection You and any caregivers should wash your hands often with soap and water for at least 20 seconds. Wash hands: Before touching the PICC or the infusion device. Before changing a  bandage (dressing). Do not change the dressing unless you have been taught to do so and have shown you are able to change it safely. Flush the PICC as told. Tell your health care provider right away if the PICC is hard to flush or does not flush. Do not use force to flush the PICC. Use clean and germ-free (sterile) supplies only. Keep the supplies in a dry place. Do not reuse needles, syringes, or any other supplies. Reusing supplies can lead to infection. Keep the PICC dressing dry and secure it with tape if the edges stop sticking to your skin. Check your PICC insertion site every day for signs of infection. Check for: Redness, swelling, or pain. Fluid or blood. Warmth. Pus or a bad smell. Preventing other problems Do not use a syringe that is less than 10 mL to flush the PICC. Do not have your blood pressure checked on the arm in which the PICC is placed. Do not ever pull or tug on the PICC. Keep it secured to your arm with tape or a stretch wrap when not in use. Do not take the PICC out yourself. Only a trained health care provider should remove the PICC. Keep pets and children away from your PICC. How to care for your PICC dressing Keep your PICC dressing clean and dry to prevent infection. Do not take baths, swim, or use a hot tub until your health care provider approves. Ask your health care provider if you can take   showers. You may only be allowed to take sponge baths. When you are allowed to shower: Ask your health care provider to teach you how to wrap the PICC. Cover the PICC with clear plastic wrap and tape to keep it dry while showering. Follow instructions from your health care provider about how to take care of your insertion site and dressing. Make sure you: Wash your hands with soap and water for at least 20 seconds before and after you change your dressing. If soap and water are not available, use hand sanitizer. Change your dressing only if taught to do so by your health care  provider. Your PICC dressing needs to be changed if it becomes loose or wet. Leave stitches (sutures), skin glue, or adhesive strips in place. These skin closures may need to stay in place for 2 weeks or longer. If adhesive strip edges start to loosen and curl up, you may trim the loose edges. Do not remove adhesive strips completely unless your health care provider tells you to do that. Follow these instructions at home: Disposal of supplies Throw away any syringes in a disposal container that is meant for sharp items (sharps container). You can buy a sharps container from a pharmacy, or you can make one by using an empty, hard plastic bottle with a lid. Place any used dressings or infusion bags into a plastic bag. Throw that bag in the trash. General instructions  Always carry your PICC identification card or wear a medical alert bracelet. Keep the tube clamped at all times, unless it is being used. Always carry a smooth-edge clamp with you to clamp the PICC if it breaks. Do not use scissors or sharp objects near the tube. You may bend your arm and move it freely. If your PICC is near or at the bend of your elbow, avoid activity with repeated motion at the elbow. Avoid lifting heavy objects as told by your health care provider. Keep all follow-up visits. This is important. You will need to have your PICC dressing changed at least once a week. Contact a health care provider if: You have pain in your arm, ear, face, or teeth. You have a fever or chills. You have redness, swelling, or pain around the insertion site. You have fluid or blood coming from the insertion site. Your insertion site feels warm to the touch. You have pus or a bad smell coming from the insertion site. Your skin feels hard and raised around the insertion site. Your PICC dressing has gotten wet or is coming off and you have not been taught how to change it. Get help right away if: You have problems with your PICC, such as  your PICC: Was tugged or pulled and has partially come out. Do not  push the PICC back in. Cannot be flushed, is hard to flush, or leaks around the insertion site when it is flushed. Makes a flushing sound when it is flushed. Appears to have a hole or tear. Is accidentally pulled all the way out. If this happens, cover the insertion site with a gauze dressing. Do not throw the PICC away. Your health care provider will need to check it to be sure the entire catheter came out. You feel your heart racing or skipping beats, or you have chest pain. You have shortness of breath or trouble breathing. You have swelling, redness, warmth, or pain in the arm in which the PICC is placed. You have a red streak going up your arm that   starts under the PICC dressing. These symptoms may be an emergency. Get help right away. Call 911. Do not wait to see if the symptoms will go away. Do not drive yourself to the hospital. Summary A peripherally inserted central catheter (PICC) is a long, thin, flexible tube (catheter) that is put into a vein in the arm or leg. If cared for properly, a PICC can remain in place for many months. Having a PICC can allow you to go home from the hospital sooner and continue treatment at home. The PICC is inserted using a germ-free (sterile) technique by a specially trained health care provider. Only a trained health care provider should remove it. Do not have your blood pressure checked on the arm in which your PICC is placed. Always keep your PICC identification card with you. This information is not intended to replace advice given to you by your health care provider. Make sure you discuss any questions you have with your health care provider. Document Revised: 09/15/2020 Document Reviewed: 09/15/2020 Elsevier Patient Education  2022 [employer]  

## 2021-03-02 ENCOUNTER — Inpatient Hospital Stay: Payer: 59

## 2021-03-02 ENCOUNTER — Other Ambulatory Visit: Payer: Self-pay

## 2021-03-02 VITALS — BP 98/67 | HR 75 | Temp 97.7°F | Resp 18

## 2021-03-02 DIAGNOSIS — Z5111 Encounter for antineoplastic chemotherapy: Secondary | ICD-10-CM | POA: Diagnosis not present

## 2021-03-02 DIAGNOSIS — Z452 Encounter for adjustment and management of vascular access device: Secondary | ICD-10-CM

## 2021-03-02 DIAGNOSIS — Z23 Encounter for immunization: Secondary | ICD-10-CM

## 2021-03-02 MED ORDER — HEPARIN SOD (PORK) LOCK FLUSH 100 UNIT/ML IV SOLN
500.0000 [IU] | Freq: Once | INTRAVENOUS | Status: AC
  Administered 2021-03-02: 14:00:00 500 [IU] via INTRAVENOUS

## 2021-03-02 MED ORDER — SODIUM CHLORIDE 0.9% FLUSH
10.0000 mL | Freq: Once | INTRAVENOUS | Status: AC
  Administered 2021-03-02: 14:00:00 10 mL via INTRAVENOUS

## 2021-03-02 MED ORDER — INFLUENZA VAC SPLIT QUAD 0.5 ML IM SUSY
0.5000 mL | PREFILLED_SYRINGE | Freq: Once | INTRAMUSCULAR | Status: AC
  Administered 2021-03-02: 14:00:00 0.5 mL via INTRAMUSCULAR
  Filled 2021-03-02: qty 0.5

## 2021-03-02 NOTE — Patient Instructions (Signed)
Influenza Virus Vaccine injection °What is this medication? °INFLUENZA VIRUS VACCINE (in floo EN zuh VAHY ruhs vak SEEN) helps to reduce the risk of getting influenza also known as the flu. The vaccine only helps protect you against some strains of the flu. °This medicine may be used for other purposes; ask your health care provider or pharmacist if you have questions. °COMMON BRAND NAME(S): Afluria, Afluria Quadrivalent, Agriflu, Alfuria, FLUAD, FLUAD Quadrivalent, Fluarix, Fluarix Quadrivalent, Flublok, Flublok Quadrivalent, FLUCELVAX, FLUCELVAX Quadrivalent, Flulaval, Flulaval Quadrivalent, Fluvirin, Fluzone, Fluzone High-Dose, Fluzone Intradermal, Fluzone Quadrivalent °What should I tell my care team before I take this medication? °They need to know if you have any of these conditions: °bleeding disorder like hemophilia °fever or infection °Guillain-Barre syndrome or other neurological problems °immune system problems °infection with the human immunodeficiency virus (HIV) or AIDS °low blood platelet counts °multiple sclerosis °an unusual or allergic reaction to influenza virus vaccine, latex, other medicines, foods, dyes, or preservatives. Different brands of vaccines contain different allergens. Some may contain latex or eggs. Talk to your doctor about your allergies to make sure that you get the right vaccine. °pregnant or trying to get pregnant °breast-feeding °How should I use this medication? °This vaccine is for injection into a muscle or under the skin. It is given by a health care professional. °A copy of Vaccine Information Statements will be given before each vaccination. Read this sheet carefully each time. The sheet may change frequently. °Talk to your healthcare provider to see which vaccines are right for you. Some vaccines should not be used in all age groups. °Overdosage: If you think you have taken too much of this medicine contact a poison control center or emergency room at once. °NOTE: This  medicine is only for you. Do not share this medicine with others. °What if I miss a dose? °This does not apply. °What may interact with this medication? °chemotherapy or radiation therapy °medicines that lower your immune system like etanercept, anakinra, infliximab, and adalimumab °medicines that treat or prevent blood clots like warfarin °phenytoin °steroid medicines like prednisone or cortisone °theophylline °vaccines °This list may not describe all possible interactions. Give your health care provider a list of all the medicines, herbs, non-prescription drugs, or dietary supplements you use. Also tell them if you smoke, drink alcohol, or use illegal drugs. Some items may interact with your medicine. °What should I watch for while using this medication? °Report any side effects that do not go away within 3 days to your doctor or health care professional. Call your health care provider if any unusual symptoms occur within 6 weeks of receiving this vaccine. °You may still catch the flu, but the illness is not usually as bad. You cannot get the flu from the vaccine. The vaccine will not protect against colds or other illnesses that may cause fever. The vaccine is needed every year. °What side effects may I notice from receiving this medication? °Side effects that you should report to your doctor or health care professional as soon as possible: °allergic reactions like skin rash, itching or hives, swelling of the face, lips, or tongue °Side effects that usually do not require medical attention (report to your doctor or health care professional if they continue or are bothersome): °fever °headache °muscle aches and pains °pain, tenderness, redness, or swelling at the injection site °tiredness °This list may not describe all possible side effects. Call your doctor for medical advice about side effects. You may report side effects to FDA at 1-800-FDA-1088. °  Where should I keep my medication? °The vaccine will be given  by a health care professional in a clinic, pharmacy, doctor's office, or other health care setting. You will not be given vaccine doses to store at home. °NOTE: This sheet is a summary. It may not cover all possible information. If you have questions about this medicine, talk to your doctor, pharmacist, or health care provider. °© 2022 [employer] (2020-11-16 00:00:00) ° °

## 2021-03-03 ENCOUNTER — Other Ambulatory Visit: Payer: Self-pay | Admitting: Family Medicine

## 2021-03-03 ENCOUNTER — Encounter: Payer: Self-pay | Admitting: *Deleted

## 2021-03-03 DIAGNOSIS — E785 Hyperlipidemia, unspecified: Secondary | ICD-10-CM

## 2021-03-03 NOTE — Progress Notes (Signed)
Notified by Fredna Dow at [facility] that sample drawn on 02/16/21 is being re-tested via back up tubes. Will be delay in results.

## 2021-03-06 ENCOUNTER — Other Ambulatory Visit: Payer: Self-pay | Admitting: Oncology

## 2021-03-08 ENCOUNTER — Inpatient Hospital Stay: Payer: 59

## 2021-03-08 ENCOUNTER — Other Ambulatory Visit: Payer: Self-pay

## 2021-03-08 ENCOUNTER — Inpatient Hospital Stay (HOSPITAL_BASED_OUTPATIENT_CLINIC_OR_DEPARTMENT_OTHER): Payer: 59 | Admitting: Oncology

## 2021-03-08 ENCOUNTER — Telehealth: Payer: Self-pay

## 2021-03-08 ENCOUNTER — Other Ambulatory Visit: Payer: Self-pay | Admitting: Family Medicine

## 2021-03-08 VITALS — BP 122/77 | HR 70 | Temp 98.1°F | Resp 20 | Ht 76.0 in | Wt 213.0 lb

## 2021-03-08 VITALS — BP 129/80 | HR 62

## 2021-03-08 DIAGNOSIS — Z5111 Encounter for antineoplastic chemotherapy: Secondary | ICD-10-CM | POA: Diagnosis not present

## 2021-03-08 DIAGNOSIS — C189 Malignant neoplasm of colon, unspecified: Secondary | ICD-10-CM | POA: Diagnosis not present

## 2021-03-08 DIAGNOSIS — I10 Essential (primary) hypertension: Secondary | ICD-10-CM

## 2021-03-08 DIAGNOSIS — C184 Malignant neoplasm of transverse colon: Secondary | ICD-10-CM

## 2021-03-08 LAB — CBC WITH DIFFERENTIAL (CANCER CENTER ONLY)
Abs Immature Granulocytes: 0.02 10*3/uL (ref 0.00–0.07)
Basophils Absolute: 0 10*3/uL (ref 0.0–0.1)
Basophils Relative: 1 %
Eosinophils Absolute: 0.1 10*3/uL (ref 0.0–0.5)
Eosinophils Relative: 3 %
HCT: 40 % (ref 39.0–52.0)
Hemoglobin: 13.5 g/dL (ref 13.0–17.0)
Immature Granulocytes: 0 %
Lymphocytes Relative: 19 %
Lymphs Abs: 0.9 10*3/uL (ref 0.7–4.0)
MCH: 28.8 pg (ref 26.0–34.0)
MCHC: 33.8 g/dL (ref 30.0–36.0)
MCV: 85.5 fL (ref 80.0–100.0)
Monocytes Absolute: 0.6 10*3/uL (ref 0.1–1.0)
Monocytes Relative: 11 %
Neutro Abs: 3.3 10*3/uL (ref 1.7–7.7)
Neutrophils Relative %: 66 %
Platelet Count: 134 10*3/uL — ABNORMAL LOW (ref 150–400)
RBC: 4.68 MIL/uL (ref 4.22–5.81)
RDW: 15.9 % — ABNORMAL HIGH (ref 11.5–15.5)
WBC Count: 5 10*3/uL (ref 4.0–10.5)
nRBC: 0 % (ref 0.0–0.2)

## 2021-03-08 LAB — CMP (CANCER CENTER ONLY)
ALT: 25 U/L (ref 0–44)
AST: 28 U/L (ref 15–41)
Albumin: 4 g/dL (ref 3.5–5.0)
Alkaline Phosphatase: 52 U/L (ref 38–126)
Anion gap: 7 (ref 5–15)
BUN: 12 mg/dL (ref 8–23)
CO2: 27 mmol/L (ref 22–32)
Calcium: 9.1 mg/dL (ref 8.9–10.3)
Chloride: 106 mmol/L (ref 98–111)
Creatinine: 0.8 mg/dL (ref 0.61–1.24)
GFR, Estimated: >60 mL/min (ref >60)
Glucose, Bld: 137 mg/dL — ABNORMAL HIGH (ref 70–99)
Potassium: 3.6 mmol/L (ref 3.5–5.1)
Sodium: 140 mmol/L (ref 135–145)
Total Bilirubin: 0.7 mg/dL (ref 0.3–1.2)
Total Protein: 6.1 g/dL — ABNORMAL LOW (ref 6.5–8.1)

## 2021-03-08 LAB — GUARDANT 360

## 2021-03-08 MED ORDER — FLUOROURACIL CHEMO INJECTION 2.5 GM/50ML
300.0000 mg/m2 | Freq: Once | INTRAVENOUS | Status: AC
  Administered 2021-03-08: 13:00:00 700 mg via INTRAVENOUS
  Filled 2021-03-08: qty 14

## 2021-03-08 MED ORDER — SODIUM CHLORIDE 0.9 % IV SOLN
10.0000 mg | Freq: Once | INTRAVENOUS | Status: AC
  Administered 2021-03-08: 10:00:00 10 mg via INTRAVENOUS
  Filled 2021-03-08: qty 1

## 2021-03-08 MED ORDER — SODIUM CHLORIDE 0.9 % IV SOLN
150.0000 mg | Freq: Once | INTRAVENOUS | Status: AC
  Administered 2021-03-08: 10:00:00 150 mg via INTRAVENOUS
  Filled 2021-03-08: qty 5

## 2021-03-08 MED ORDER — DEXAMETHASONE 4 MG PO TABS: 4.0000 mg | ORAL_TABLET | Freq: Two times a day (BID) | ORAL | 0 refills | Status: DC

## 2021-03-08 MED ORDER — DEXAMETHASONE 4 MG PO TABS: 4.0000 mg | ORAL_TABLET | Freq: Two times a day (BID) | ORAL | 0 refills | Status: AC

## 2021-03-08 MED ORDER — DEXTROSE 5 % IV SOLN: Freq: Once | INTRAVENOUS | Status: AC

## 2021-03-08 MED ORDER — SODIUM CHLORIDE 0.9 % IV SOLN
2000.0000 mg/m2 | INTRAVENOUS | Status: DC
  Administered 2021-03-08: 13:00:00 4500 mg via INTRAVENOUS
  Filled 2021-03-08: qty 90

## 2021-03-08 MED ORDER — PALONOSETRON HCL INJECTION 0.25 MG/5ML
0.2500 mg | Freq: Once | INTRAVENOUS | Status: AC
  Administered 2021-03-08: 10:00:00 0.25 mg via INTRAVENOUS
  Filled 2021-03-08: qty 5

## 2021-03-08 MED ORDER — OXALIPLATIN CHEMO INJECTION 100 MG/20ML
65.0000 mg/m2 | Freq: Once | INTRAVENOUS | Status: AC
  Administered 2021-03-08: 11:00:00 145 mg via INTRAVENOUS
  Filled 2021-03-08: qty 20

## 2021-03-08 MED ORDER — LEUCOVORIN CALCIUM INJECTION 350 MG
300.0000 mg/m2 | Freq: Once | INTRAVENOUS | Status: AC
  Administered 2021-03-08: 11:00:00 678 mg via INTRAVENOUS
  Filled 2021-03-08: qty 33.9

## 2021-03-08 NOTE — Progress Notes (Signed)
Patient presents for treatment. RN assessment completed along with the following:  Labs/vitals reviewed - Yes, and within treatment parameters.   Weight within 10% of previous measurement - Yes Oncology Treatment Attestation completed for current therapy- Yes, on date 12/29/20 Informed consent completed and reflects current therapy/intent - Yes, on date 01/24/21             Provider progress note reviewed - Yes, today's provider note was reviewed. Treatment/Antibody/Supportive plan reviewed - Yes, and there are no adjustments needed for today's treatment. S&H and other orders reviewed - Yes, and there are no additional orders identified. Previous treatment date reviewed - Yes, and the appropriate amount of time has elapsed between treatments. Clinic Hand Off Received from - Danae Orleans, LPN  Patient to proceed with treatment.

## 2021-03-08 NOTE — Telephone Encounter (Signed)
Patient seen by Dr. Sherrill today ? ?Vitals are within treatment parameters. ? ?Labs reviewed by Dr. Sherrill and are within treatment parameters. ? ?Per physician team, patient is ready for treatment and there are NO modifications to the treatment plan.  ?

## 2021-03-08 NOTE — Progress Notes (Signed)
[place] OFFICE PROGRESS NOTE   Diagnosis: Colon cancer  INTERVAL HISTORY:   [name] completed another cycle of FOLFOX on 02/21/2021.  He had cold sensitivity lasting for approximately 6 days following chemotherapy.  No neuropathy symptoms at present.  He reports nausea beginning on day 3.  The nausea was partially relieved with Zofran.  Objective:  Vital signs in last 24 hours:  Blood pressure 122/77, pulse 70, temperature 98.1 F (36.7 C), temperature source Oral, resp. rate 20, height _0  (1.93 m), weight 213 lb (96.6 kg), SpO2 98 %.    HEENT: No thrush or ulcers Resp: Lungs clear bilaterally Cardio: Regular rate and rhythm GI: No hepatosplenomegaly Vascular: No leg edema  Skin: Palms without erythema  Portacath/PICC-without erythema  Lab Results:  Lab Results  Component Value Date   WBC 5.0 03/08/2021   HGB 13.5 03/08/2021   HCT 40.0 03/08/2021   MCV 85.5 03/08/2021   PLT 134 (L) 03/08/2021   NEUTROABS 3.3 03/08/2021    CMP  Lab Results  Component Value Date   NA 138 02/21/2021   K 3.8 02/21/2021   CL 102 02/21/2021   CO2 29 02/21/2021   GLUCOSE 115 (H) 02/21/2021   BUN 14 02/21/2021   CREATININE 0.78 02/21/2021   CALCIUM 9.2 02/21/2021   PROT 6.5 02/21/2021   ALBUMIN 4.2 02/21/2021   AST 25 02/21/2021   ALT 22 02/21/2021   ALKPHOS 53 02/21/2021   BILITOT 0.6 02/21/2021   GFRNONAA >60 02/21/2021   GFRAA 101 06/26/2019    Lab Results  Component Value Date   CEA 0.5 10/05/2020      Medications: I have reviewed the patient's current medications.   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, MSI-high, loss of MLH1 and PMS2 expression, negative for BRAF V600E mutation, MLH1 hypermethylation absent  Negative CancerNext panel-VUS in the  [facility] gene colonoscopy 10/05/2020-near completely obstructing mass at the hepatic flexure-could not be passed, polyps in the sigmoid and transverse colon CTs 10/15/2020-short segment wall thickening with adjacent inflammatory stranding at the hepatic flexure, prominent mesenteric nodes adjacent to the colonic neoplasm measuring up to 5 mm, prominent gastropathic, hepatoduodenal ligament, and right sided retroperitoneal nodes measured up to 7 mm, subcentimeter hypodense hypodensities-too small to characterize, tiny right middle lobe nodule 12/13/2020-ctDNA not detected Cycle 1 CAPOX 12/13/2020, Xeloda discontinued around 12/24/2020 Cycle 1 FOLFOX 01/24/2021 Cycle 2 FOLFOX 02/07/2021 Guardant reveal 02/16/2021-detected Cycle 3 FOLFOX 02/21/2021 Cycle 4 FOLFOX 03/08/2021, 5-FU dose escalated, home Decadron prophylaxis added   Tubular adenoma on the colonoscopy 10/05/2020 and on colonoscopy 06/14/2010 Hypertension [facility] admission 12/30/2020-acute gastroenteritis/colitis Negative for the IVS14+ DPYD mutation      Disposition: Dylan Fischer has completed 4 cycles of FOLFOX.  He reports delayed nausea following the last few cycles of chemotherapy.  Decadron prophylaxis will be added with this cycle of chemotherapy.   [name] had a guardant reveal study on 02/16/2021.  Circulating tumor DNA was detected.  He had a negative test on 12/13/2020.  The significance of this finding is unclear.  We will proceed with FOLFOX today and repeat the guardant testing when he is here in 2 weeks.  He will return for an office visit and cycle 6 chemotherapy in 2 weeks.  Betsy Coder, MD  03/08/2021  8:50 AM

## 2021-03-08 NOTE — Patient Instructions (Signed)
[facility]   The chemotherapy medication bag should finish at 46 hours, 96 hours, or 7 days. For example, if your pump is scheduled for 46 hours and it was put on at 4:00 p.m., it should finish at 2:00 p.m. the day it is scheduled to come off regardless of your appointment time.     Estimated time to finish at 11:30 Thursday, March 10, 2021.   If the display on your pump reads "Low Volume" and it is beeping, take the batteries out of the pump and come to the cancer center for it to be taken off.   If the pump alarms go off prior to the pump reading "Low Volume" then call [phone] and someone can assist you.  If the plunger comes out and the chemotherapy medication is leaking out, please use your home chemo spill kit to clean up the spill. Do NOT use paper towels or other household products.  If you have problems or questions regarding your pump, please call either [phone] (24 hours a day) or the cancer center Monday-Friday 8:00 a.m.- 4:30 p.m. at the clinic number and we will assist you. If you are unable to get assistance, then go to the nearest Emergency Department and ask the staff to contact the IV team for assistance.  Discharge Instructions: Thank you for choosing [facility] to provide your oncology and hematology care.   If you have a lab appointment with the [facility], please go directly to the [facility] and check in at the registration area.   Wear comfortable clothing and clothing appropriate for easy access to any Portacath or PICC line.   We strive to give you quality time with your provider. You may need to reschedule your appointment if you arrive late (15 or more minutes).  Arriving late affects you and other patients whose appointments are after yours.  Also, if you miss three or more appointments without notifying the office, you may be dismissed from the clinic at the providers discretion.      For  prescription refill requests, have your pharmacy contact our office and allow 72 hours for refills to be completed.    Today you received the following chemotherapy and/or immunotherapy agents oxaliplatin, leucovorin, fluorouracil      To help prevent nausea and vomiting after your treatment, we encourage you to take your nausea medication as directed.  BELOW ARE SYMPTOMS THAT SHOULD BE REPORTED IMMEDIATELY: *FEVER GREATER THAN 100.4 F (38 C) OR HIGHER *CHILLS OR SWEATING *NAUSEA AND VOMITING THAT IS NOT CONTROLLED WITH YOUR NAUSEA MEDICATION *UNUSUAL SHORTNESS OF BREATH *UNUSUAL BRUISING OR BLEEDING *URINARY PROBLEMS (pain or burning when urinating, or frequent urination) *BOWEL PROBLEMS (unusual diarrhea, constipation, pain near the anus) TENDERNESS IN MOUTH AND THROAT WITH OR WITHOUT PRESENCE OF ULCERS (sore throat, sores in mouth, or a toothache) UNUSUAL RASH, SWELLING OR PAIN  UNUSUAL VAGINAL DISCHARGE OR ITCHING   Items with * indicate a potential emergency and should be followed up as soon as possible or go to the Emergency Department if any problems should occur.  Please show the CHEMOTHERAPY ALERT CARD or IMMUNOTHERAPY ALERT CARD at check-in to the Emergency Department and triage nurse.  Should you have questions after your visit or need to cancel or reschedule your appointment, please contact [facility]  Dept: [phone]  and follow the prompts.  Office hours are 8:00 a.m. to 4:30 p.m. Monday - Friday. Please note that voicemails  left after 4:00 p.m. may not be returned until the following business day.  We are closed weekends and major holidays. You have access to a nurse at all times for urgent questions. Please call the main number to the clinic Dept: [phone] and follow the prompts.   For any non-urgent questions, you may also contact your provider using MyChart. We now offer e-Visits for anyone 37 and older to request care online for  non-urgent symptoms. For details visit [email or web address].   Also download the MyChart app! Go to the app store, search "MyChart", open the app, select [facility], and log in with your MyChart username and password.  Due to Covid, a mask is required upon entering the hospital/clinic. If you do not have a mask, one will be given to you upon arrival. For doctor visits, patients may have 1 support person aged 66 or older with them. For treatment visits, patients cannot have anyone with them due to current Covid guidelines and our immunocompromised population.   Oxaliplatin Injection What is this medication? OXALIPLATIN (ox AL i PLA tin) is a chemotherapy drug. It targets fast dividing cells, like cancer cells, and causes these cells to die. This medicine is used to treat cancers of the colon and rectum, and many other cancers. This medicine may be used for other purposes; ask your health care provider or pharmacist if you have questions. COMMON BRAND NAME(S): Eloxatin What should I tell my care team before I take this medication? They need to know if you have any of these conditions: heart disease history of irregular heartbeat liver disease low blood counts, like white cells, platelets, or red blood cells lung or breathing disease, like asthma take medicines that treat or prevent blood clots tingling of the fingers or toes, or other nerve disorder an unusual or allergic reaction to oxaliplatin, other chemotherapy, other medicines, foods, dyes, or preservatives pregnant or trying to get pregnant breast-feeding How should I use this medication? This drug is given as an infusion into a vein. It is administered in a hospital or clinic by a specially trained health care professional. Talk to your pediatrician regarding the use of this medicine in children. Special care may be needed. Overdosage: If you think you have taken too much of this medicine contact a poison control center or emergency  room at once. NOTE: This medicine is only for you. Do not share this medicine with others. What if I miss a dose? It is important not to miss a dose. Call your doctor or health care professional if you are unable to keep an appointment. What may interact with this medication? Do not take this medicine with any of the following medications: cisapride dronedarone pimozide thioridazine This medicine may also interact with the following medications: aspirin and aspirin-like medicines certain medicines that treat or prevent blood clots like warfarin, apixaban, dabigatran, and rivaroxaban cisplatin cyclosporine diuretics medicines for infection like acyclovir, adefovir, amphotericin B, bacitracin, cidofovir, foscarnet, ganciclovir, gentamicin, pentamidine, vancomycin NSAIDs, medicines for pain and inflammation, like ibuprofen or naproxen other medicines that prolong the QT interval (an abnormal heart rhythm) pamidronate zoledronic acid This list may not describe all possible interactions. Give your health care provider a list of all the medicines, herbs, non-prescription drugs, or dietary supplements you use. Also tell them if you smoke, drink alcohol, or use illegal drugs. Some items may interact with your medicine. What should I watch for while using this medication? Your condition will be monitored carefully while you are receiving this  medicine. You may need blood work done while you are taking this medicine. This medicine may make you feel generally unwell. This is not uncommon as chemotherapy can affect healthy cells as well as cancer cells. Report any side effects. Continue your course of treatment even though you feel ill unless your healthcare professional tells you to stop. This medicine can make you more sensitive to cold. Do not drink cold drinks or use ice. Cover exposed skin before coming in contact with cold temperatures or cold objects. When out in cold weather wear warm clothing  and cover your mouth and nose to warm the air that goes into your lungs. Tell your doctor if you get sensitive to the cold. Do not become pregnant while taking this medicine or for 9 months after stopping it. Women should inform their health care professional if they wish to become pregnant or think they might be pregnant. Men should not father a child while taking this medicine and for 6 months after stopping it. There is potential for serious side effects to an unborn child. Talk to your health care professional for more information. Do not breast-feed a child while taking this medicine or for 3 months after stopping it. This medicine has caused ovarian failure in some women. This medicine may make it more difficult to get pregnant. Talk to your health care professional if you are concerned about your fertility. This medicine has caused decreased sperm counts in some men. This may make it more difficult to father a child. Talk to your health care professional if you are concerned about your fertility. This medicine may increase your risk of getting an infection. Call your health care professional for advice if you get a fever, chills, or sore throat, or other symptoms of a cold or flu. Do not treat yourself. Try to avoid being around people who are sick. Avoid taking medicines that contain aspirin, acetaminophen, ibuprofen, naproxen, or ketoprofen unless instructed by your health care professional. These medicines may hide a fever. Be careful brushing or flossing your teeth or using a toothpick because you may get an infection or bleed more easily. If you have any dental work done, tell your dentist you are receiving this medicine. What side effects may I notice from receiving this medication? Side effects that you should report to your doctor or health care professional as soon as possible: allergic reactions like skin rash, itching or hives, swelling of the face, lips, or tongue breathing  problems cough low blood counts - this medicine may decrease the number of white blood cells, red blood cells, and platelets. You may be at increased risk for infections and bleeding nausea, vomiting pain, redness, or irritation at site where injected pain, tingling, numbness in the hands or feet signs and symptoms of bleeding such as bloody or black, tarry stools; red or dark brown urine; spitting up blood or brown material that looks like coffee grounds; red spots on the skin; unusual bruising or bleeding from the eyes, gums, or nose signs and symptoms of a dangerous change in heartbeat or heart rhythm like chest pain; dizziness; fast, irregular heartbeat; palpitations; feeling faint or lightheaded; falls signs and symptoms of infection like fever; chills; cough; sore throat; pain or trouble passing urine signs and symptoms of liver injury like dark yellow or brown urine; general ill feeling or flu-like symptoms; light-colored stools; loss of appetite; nausea; right upper belly pain; unusually weak or tired; yellowing of the eyes or skin signs and symptoms of  low red blood cells or anemia such as unusually weak or tired; feeling faint or lightheaded; falls signs and symptoms of muscle injury like dark urine; trouble passing urine or change in the amount of urine; unusually weak or tired; muscle pain; back pain Side effects that usually do not require medical attention (report to your doctor or health care professional if they continue or are bothersome): changes in taste diarrhea gas hair loss loss of appetite mouth sores This list may not describe all possible side effects. Call your doctor for medical advice about side effects. You may report side effects to FDA at 1-800-FDA-1088. Where should I keep my medication? This drug is given in a hospital or clinic and will not be stored at home. NOTE: This sheet is a summary. It may not cover all possible information. If you have questions about  this medicine, talk to your doctor, pharmacist, or health care provider.  2022 Elsevier/Gold Standard (2020-11-16 00:00:00)  Leucovorin injection What is this medication? LEUCOVORIN (loo koe VOR in) is used to prevent or treat the harmful effects of some medicines. This medicine is used to treat anemia caused by a low amount of folic acid in the body. It is also used with 5-fluorouracil (5-FU) to treat colon cancer. This medicine may be used for other purposes; ask your health care provider or pharmacist if you have questions. What should I tell my care team before I take this medication? They need to know if you have any of these conditions: anemia from low levels of vitamin B-12 in the blood an unusual or allergic reaction to leucovorin, folic acid, other medicines, foods, dyes, or preservatives pregnant or trying to get pregnant breast-feeding How should I use this medication? This medicine is for injection into a muscle or into a vein. It is given by a health care professional in a hospital or clinic setting. Talk to your pediatrician regarding the use of this medicine in children. Special care may be needed. Overdosage: If you think you have taken too much of this medicine contact a poison control center or emergency room at once. NOTE: This medicine is only for you. Do not share this medicine with others. What if I miss a dose? This does not apply. What may interact with this medication? capecitabine fluorouracil phenobarbital phenytoin primidone trimethoprim-sulfamethoxazole This list may not describe all possible interactions. Give your health care provider a list of all the medicines, herbs, non-prescription drugs, or dietary supplements you use. Also tell them if you smoke, drink alcohol, or use illegal drugs. Some items may interact with your medicine. What should I watch for while using this medication? Your condition will be monitored carefully while you are receiving this  medicine. This medicine may increase the side effects of 5-fluorouracil, 5-FU. Tell your doctor or health care professional if you have diarrhea or mouth sores that do not get better or that get worse. What side effects may I notice from receiving this medication? Side effects that you should report to your doctor or health care professional as soon as possible: allergic reactions like skin rash, itching or hives, swelling of the face, lips, or tongue breathing problems fever, infection mouth sores unusual bleeding or bruising unusually weak or tired Side effects that usually do not require medical attention (report to your doctor or health care professional if they continue or are bothersome): constipation or diarrhea loss of appetite nausea, vomiting This list may not describe all possible side effects. Call your doctor for medical  advice about side effects. You may report side effects to FDA at 1-800-FDA-1088. Where should I keep my medication? This drug is given in a hospital or clinic and will not be stored at home. NOTE: This sheet is a summary. It may not cover all possible information. If you have questions about this medicine, talk to your doctor, pharmacist, or health care provider.  2022 Elsevier/Gold Standard (2007-09-05 00:00:00)  Fluorouracil, 5-FU injection What is this medication? FLUOROURACIL, 5-FU (flure oh YOOR a sil) is a chemotherapy drug. It slows the growth of cancer cells. This medicine is used to treat many types of cancer like breast cancer, colon or rectal cancer, pancreatic cancer, and stomach cancer. This medicine may be used for other purposes; ask your health care provider or pharmacist if you have questions. COMMON BRAND NAME(S): Adrucil What should I tell my care team before I take this medication? They need to know if you have any of these conditions: blood disorders dihydropyrimidine dehydrogenase (DPD) deficiency infection (especially a virus  infection such as chickenpox, cold sores, or herpes) kidney disease liver disease malnourished, poor nutrition recent or ongoing radiation therapy an unusual or allergic reaction to fluorouracil, other chemotherapy, other medicines, foods, dyes, or preservatives pregnant or trying to get pregnant breast-feeding How should I use this medication? This drug is given as an infusion or injection into a vein. It is administered in a hospital or clinic by a specially trained health care professional. Talk to your pediatrician regarding the use of this medicine in children. Special care may be needed. Overdosage: If you think you have taken too much of this medicine contact a poison control center or emergency room at once. NOTE: This medicine is only for you. Do not share this medicine with others. What if I miss a dose? It is important not to miss your dose. Call your doctor or health care professional if you are unable to keep an appointment. What may interact with this medication? Do not take this medicine with any of the following medications: live virus vaccines This medicine may also interact with the following medications: medicines that treat or prevent blood clots like warfarin, enoxaparin, and dalteparin This list may not describe all possible interactions. Give your health care provider a list of all the medicines, herbs, non-prescription drugs, or dietary supplements you use. Also tell them if you smoke, drink alcohol, or use illegal drugs. Some items may interact with your medicine. What should I watch for while using this medication? Visit your doctor for checks on your progress. This drug may make you feel generally unwell. This is not uncommon, as chemotherapy can affect healthy cells as well as cancer cells. Report any side effects. Continue your course of treatment even though you feel ill unless your doctor tells you to stop. In some cases, you may be given additional medicines to  help with side effects. Follow all directions for their use. Call your doctor or health care professional for advice if you get a fever, chills or sore throat, or other symptoms of a cold or flu. Do not treat yourself. This drug decreases your body's ability to fight infections. Try to avoid being around people who are sick. This medicine may increase your risk to bruise or bleed. Call your doctor or health care professional if you notice any unusual bleeding. Be careful brushing and flossing your teeth or using a toothpick because you may get an infection or bleed more easily. If you have any dental work done,  tell your dentist you are receiving this medicine. Avoid taking products that contain aspirin, acetaminophen, ibuprofen, naproxen, or ketoprofen unless instructed by your doctor. These medicines may hide a fever. Do not become pregnant while taking this medicine. Women should inform their doctor if they wish to become pregnant or think they might be pregnant. There is a potential for serious side effects to an unborn child. Talk to your health care professional or pharmacist for more information. Do not breast-feed an infant while taking this medicine. Men should inform their doctor if they wish to father a child. This medicine may lower sperm counts. Do not treat diarrhea with over the counter products. Contact your doctor if you have diarrhea that lasts more than 2 days or if it is severe and watery. This medicine can make you more sensitive to the sun. Keep out of the sun. If you cannot avoid being in the sun, wear protective clothing and use sunscreen. Do not use sun lamps or tanning beds/booths. What side effects may I notice from receiving this medication? Side effects that you should report to your doctor or health care professional as soon as possible: allergic reactions like skin rash, itching or hives, swelling of the face, lips, or tongue low blood counts - this medicine may decrease  the number of white blood cells, red blood cells and platelets. You may be at increased risk for infections and bleeding. signs of infection - fever or chills, cough, sore throat, pain or difficulty passing urine signs of decreased platelets or bleeding - bruising, pinpoint red spots on the skin, black, tarry stools, blood in the urine signs of decreased red blood cells - unusually weak or tired, fainting spells, lightheadedness breathing problems changes in vision chest pain mouth sores nausea and vomiting pain, swelling, redness at site where injected pain, tingling, numbness in the hands or feet redness, swelling, or sores on hands or feet stomach pain unusual bleeding Side effects that usually do not require medical attention (report to your doctor or health care professional if they continue or are bothersome): changes in finger or toe nails diarrhea dry or itchy skin hair loss headache loss of appetite sensitivity of eyes to the light stomach upset unusually teary eyes This list may not describe all possible side effects. Call your doctor for medical advice about side effects. You may report side effects to FDA at 1-800-FDA-1088. Where should I keep my medication? This drug is given in a hospital or clinic and will not be stored at home. NOTE: This sheet is a summary. It may not cover all possible information. If you have questions about this medicine, talk to your doctor, pharmacist, or health care provider.  2022 Elsevier/Gold Standard (2020-11-16 00:00:00)

## 2021-03-10 ENCOUNTER — Other Ambulatory Visit: Payer: Self-pay | Admitting: *Deleted

## 2021-03-10 ENCOUNTER — Other Ambulatory Visit: Payer: Self-pay

## 2021-03-10 ENCOUNTER — Inpatient Hospital Stay: Payer: 59

## 2021-03-10 VITALS — BP 108/76 | HR 66 | Temp 97.8°F | Resp 20

## 2021-03-10 DIAGNOSIS — Z5111 Encounter for antineoplastic chemotherapy: Secondary | ICD-10-CM | POA: Diagnosis not present

## 2021-03-10 DIAGNOSIS — C184 Malignant neoplasm of transverse colon: Secondary | ICD-10-CM

## 2021-03-10 MED ORDER — HEPARIN SOD (PORK) LOCK FLUSH 100 UNIT/ML IV SOLN
250.0000 [IU] | Freq: Once | INTRAVENOUS | Status: AC | PRN
  Administered 2021-03-10: 250 [IU]

## 2021-03-10 MED ORDER — HEPARIN SOD (PORK) LOCK FLUSH 100 UNIT/ML IV SOLN
250.0000 [IU] | Freq: Once | INTRAVENOUS | Status: AC
  Administered 2021-03-10: 12:00:00 250 [IU] via INTRAVENOUS

## 2021-03-10 MED ORDER — SODIUM CHLORIDE 0.9% FLUSH
10.0000 mL | Freq: Once | INTRAVENOUS | Status: AC
  Administered 2021-03-10: 10 mL via INTRAVENOUS

## 2021-03-10 MED ORDER — SODIUM CHLORIDE 0.9% FLUSH
10.0000 mL | INTRAVENOUS | Status: DC | PRN
  Administered 2021-03-10: 12:00:00 10 mL

## 2021-03-10 NOTE — Progress Notes (Signed)
Confirmed w/[facility] that repeat draw for [facility] testing would qualify as time point #3. Form forwarded to phlebotomy for 03/22/21 draw.

## 2021-03-11 ENCOUNTER — Encounter: Payer: Self-pay | Admitting: Family Medicine

## 2021-03-11 DIAGNOSIS — I10 Essential (primary) hypertension: Secondary | ICD-10-CM

## 2021-03-11 MED ORDER — OLMESARTAN MEDOXOMIL-HCTZ 20-12.5 MG PO TABS: ORAL_TABLET | ORAL | 0 refills | Status: DC

## 2021-03-13 DIAGNOSIS — C801 Malignant (primary) neoplasm, unspecified: Secondary | ICD-10-CM

## 2021-03-13 HISTORY — DX: Malignant (primary) neoplasm, unspecified: C80.1

## 2021-03-16 ENCOUNTER — Other Ambulatory Visit (HOSPITAL_BASED_OUTPATIENT_CLINIC_OR_DEPARTMENT_OTHER): Payer: Self-pay

## 2021-03-16 ENCOUNTER — Inpatient Hospital Stay: Payer: 59 | Attending: Nurse Practitioner

## 2021-03-16 ENCOUNTER — Other Ambulatory Visit: Payer: Self-pay

## 2021-03-16 ENCOUNTER — Ambulatory Visit: Payer: 59 | Attending: Internal Medicine

## 2021-03-16 VITALS — BP 113/81 | HR 68 | Temp 97.8°F | Resp 18

## 2021-03-16 DIAGNOSIS — I10 Essential (primary) hypertension: Secondary | ICD-10-CM | POA: Diagnosis not present

## 2021-03-16 DIAGNOSIS — Z5111 Encounter for antineoplastic chemotherapy: Secondary | ICD-10-CM | POA: Insufficient documentation

## 2021-03-16 DIAGNOSIS — Z452 Encounter for adjustment and management of vascular access device: Secondary | ICD-10-CM

## 2021-03-16 DIAGNOSIS — Z23 Encounter for immunization: Secondary | ICD-10-CM

## 2021-03-16 DIAGNOSIS — C184 Malignant neoplasm of transverse colon: Secondary | ICD-10-CM | POA: Insufficient documentation

## 2021-03-16 DIAGNOSIS — Z79899 Other long term (current) drug therapy: Secondary | ICD-10-CM | POA: Diagnosis not present

## 2021-03-16 MED ORDER — SODIUM CHLORIDE 0.9% FLUSH
10.0000 mL | Freq: Once | INTRAVENOUS | Status: AC
  Administered 2021-03-16: 10 mL via INTRAVENOUS

## 2021-03-16 MED ORDER — HEPARIN SOD (PORK) LOCK FLUSH 100 UNIT/ML IV SOLN
500.0000 [IU] | Freq: Once | INTRAVENOUS | Status: AC
  Administered 2021-03-16: 500 [IU] via INTRAVENOUS

## 2021-03-16 MED ORDER — PFIZER COVID-19 VAC BIVALENT 30 MCG/0.3ML IM SUSP
INTRAMUSCULAR | 0 refills | Status: DC
  Filled 2021-03-16: qty 0.3, 1d supply, fill #0

## 2021-03-16 NOTE — Progress Notes (Signed)
° °  Covid-19 Vaccination Clinic  Name:  Dylan Fischer    MRN: [id number] DOB: 11-26-1960  03/16/2021  [name] was observed post Covid-19 immunization for 15 minutes without incident. He was provided with Vaccine Information Sheet and instruction to access the V-Safe system.   [name] was instructed to call 911 with any severe reactions post vaccine: Difficulty breathing  Swelling of face and throat  A fast heartbeat  A bad rash all over body  Dizziness and weakness   Immunizations Administered     Name Date Dose VIS Date Route   Pfizer Covid-19 Vaccine Bivalent Booster 03/16/2021  2:35 PM 0.3 mL 11/10/2020 Intramuscular   Manufacturer: [place]   Lot: [id number]   [place]: [phone]

## 2021-03-20 ENCOUNTER — Other Ambulatory Visit: Payer: Self-pay | Admitting: Oncology

## 2021-03-22 ENCOUNTER — Other Ambulatory Visit: Payer: Self-pay | Admitting: *Deleted

## 2021-03-22 ENCOUNTER — Inpatient Hospital Stay: Payer: 59

## 2021-03-22 ENCOUNTER — Inpatient Hospital Stay (HOSPITAL_BASED_OUTPATIENT_CLINIC_OR_DEPARTMENT_OTHER): Payer: 59 | Admitting: Oncology

## 2021-03-22 ENCOUNTER — Other Ambulatory Visit: Payer: Self-pay

## 2021-03-22 ENCOUNTER — Encounter: Payer: Self-pay | Admitting: Nurse Practitioner

## 2021-03-22 VITALS — BP 124/83 | HR 66 | Temp 97.7°F | Resp 18 | Ht 76.0 in | Wt 212.4 lb

## 2021-03-22 DIAGNOSIS — Z5111 Encounter for antineoplastic chemotherapy: Secondary | ICD-10-CM | POA: Diagnosis not present

## 2021-03-22 DIAGNOSIS — C184 Malignant neoplasm of transverse colon: Secondary | ICD-10-CM

## 2021-03-22 LAB — CMP (CANCER CENTER ONLY)
ALT: 29 U/L (ref 0–44)
AST: 29 U/L (ref 15–41)
Albumin: 4.1 g/dL (ref 3.5–5.0)
Alkaline Phosphatase: 51 U/L (ref 38–126)
Anion gap: 10 (ref 5–15)
BUN: 12 mg/dL (ref 8–23)
CO2: 25 mmol/L (ref 22–32)
Calcium: 9.1 mg/dL (ref 8.9–10.3)
Chloride: 104 mmol/L (ref 98–111)
Creatinine: 0.78 mg/dL (ref 0.61–1.24)
GFR, Estimated: >60 mL/min (ref >60)
Glucose, Bld: 171 mg/dL — ABNORMAL HIGH (ref 70–99)
Potassium: 3.8 mmol/L (ref 3.5–5.1)
Sodium: 139 mmol/L (ref 135–145)
Total Bilirubin: 0.6 mg/dL (ref 0.3–1.2)
Total Protein: 6.5 g/dL (ref 6.5–8.1)

## 2021-03-22 LAB — CBC WITH DIFFERENTIAL (CANCER CENTER ONLY)
Abs Immature Granulocytes: 0.02 10*3/uL (ref 0.00–0.07)
Basophils Absolute: 0 10*3/uL (ref 0.0–0.1)
Basophils Relative: 0 %
Eosinophils Absolute: 0.1 10*3/uL (ref 0.0–0.5)
Eosinophils Relative: 2 %
HCT: 39.7 % (ref 39.0–52.0)
Hemoglobin: 13.3 g/dL (ref 13.0–17.0)
Immature Granulocytes: 1 %
Lymphocytes Relative: 23 %
Lymphs Abs: 0.9 10*3/uL (ref 0.7–4.0)
MCH: 28.9 pg (ref 26.0–34.0)
MCHC: 33.5 g/dL (ref 30.0–36.0)
MCV: 86.3 fL (ref 80.0–100.0)
Monocytes Absolute: 0.5 10*3/uL (ref 0.1–1.0)
Monocytes Relative: 11 %
Neutro Abs: 2.5 10*3/uL (ref 1.7–7.7)
Neutrophils Relative %: 63 %
Platelet Count: 103 10*3/uL — ABNORMAL LOW (ref 150–400)
RBC: 4.6 MIL/uL (ref 4.22–5.81)
RDW: 16.1 % — ABNORMAL HIGH (ref 11.5–15.5)
WBC Count: 4 10*3/uL (ref 4.0–10.5)
nRBC: 0 % (ref 0.0–0.2)

## 2021-03-22 MED ORDER — SODIUM CHLORIDE 0.9 % IV SOLN
2000.0000 mg/m2 | INTRAVENOUS | Status: DC
  Administered 2021-03-22: 4500 mg via INTRAVENOUS
  Filled 2021-03-22: qty 90

## 2021-03-22 MED ORDER — DEXTROSE 5 % IV SOLN: Freq: Once | INTRAVENOUS | Status: AC

## 2021-03-22 MED ORDER — OXALIPLATIN CHEMO INJECTION 100 MG/20ML
65.0000 mg/m2 | Freq: Once | INTRAVENOUS | Status: AC
  Administered 2021-03-22: 145 mg via INTRAVENOUS
  Filled 2021-03-22: qty 10

## 2021-03-22 MED ORDER — SODIUM CHLORIDE 0.9 % IV SOLN
10.0000 mg | Freq: Once | INTRAVENOUS | Status: AC
  Administered 2021-03-22: 10 mg via INTRAVENOUS
  Filled 2021-03-22: qty 10

## 2021-03-22 MED ORDER — FLUOROURACIL CHEMO INJECTION 2.5 GM/50ML
300.0000 mg/m2 | Freq: Once | INTRAVENOUS | Status: AC
  Administered 2021-03-22: 700 mg via INTRAVENOUS
  Filled 2021-03-22: qty 14

## 2021-03-22 MED ORDER — DEXAMETHASONE 4 MG PO TABS: 4.0000 mg | ORAL_TABLET | Freq: Two times a day (BID) | ORAL | 0 refills | Status: AC

## 2021-03-22 MED ORDER — SODIUM CHLORIDE 0.9 % IV SOLN
150.0000 mg | Freq: Once | INTRAVENOUS | Status: AC
  Administered 2021-03-22: 150 mg via INTRAVENOUS
  Filled 2021-03-22: qty 150

## 2021-03-22 MED ORDER — PALONOSETRON HCL INJECTION 0.25 MG/5ML
0.2500 mg | Freq: Once | INTRAVENOUS | Status: AC
  Administered 2021-03-22: 0.25 mg via INTRAVENOUS
  Filled 2021-03-22: qty 5

## 2021-03-22 MED ORDER — LEUCOVORIN CALCIUM INJECTION 350 MG
300.0000 mg/m2 | Freq: Once | INTRAVENOUS | Status: AC
  Administered 2021-03-22: 678 mg via INTRAVENOUS
  Filled 2021-03-22 (×2): qty 33.9

## 2021-03-22 NOTE — Progress Notes (Signed)
Patient seen by Dr. Sherrill today ? ?Vitals are within treatment parameters. ? ?Labs reviewed by Dr. Sherrill and are within treatment parameters. ? ?Per physician team, patient is ready for treatment and there are NO modifications to the treatment plan.  ?

## 2021-03-22 NOTE — Progress Notes (Signed)
Dylan Fischer agrees to [place] testing on 04/07/20. New order placed.

## 2021-03-22 NOTE — Progress Notes (Signed)
[name]   Diagnosis: Colon cancer  INTERVAL HISTORY:   Dylan Fischer completed another cycle of FOLFOX on 03/08/2021.  Nausea improved with Decadron prophylaxis.  He had prolonged cold sensitivity following chemotherapy.  No neuropathy symptoms at present.  He had a sore with scabbing at the left nostril.  This persists.  No difficulty with fine motor activities.  Objective:  Vital signs in last 24 hours:  Blood pressure 124/83, pulse 66, temperature 97.7 F (36.5 C), temperature source Oral, resp. rate 18, height _0  (1.93 m), weight 212 lb 6.4 oz (96.3 kg), SpO2 99 %.    HEENT: Erythema at the distal left nostril without a discrete ulcer, oropharynx without thrush or ulcers Resp: Lungs clear bilaterally Cardio: Regular rate and rhythm GI: No hepatosplenomegaly, soft and nontender Vascular: No leg edema Neuro: Mild loss of vibratory sense at the fingertips bilaterally  Portacath/PICC-without erythema  Lab Results:  Lab Results  Component Value Date   WBC 4.0 03/22/2021   HGB 13.3 03/22/2021   HCT 39.7 03/22/2021   MCV 86.3 03/22/2021   PLT 103 (L) 03/22/2021   NEUTROABS 2.5 03/22/2021    CMP  Lab Results  Component Value Date   NA 139 03/22/2021   K 3.8 03/22/2021   CL 104 03/22/2021   CO2 25 03/22/2021   GLUCOSE 171 (H) 03/22/2021   BUN 12 03/22/2021   CREATININE 0.78 03/22/2021   CALCIUM 9.1 03/22/2021   PROT 6.5 03/22/2021   ALBUMIN 4.1 03/22/2021   AST 29 03/22/2021   ALT 29 03/22/2021   ALKPHOS 51 03/22/2021   BILITOT 0.6 03/22/2021   GFRNONAA >60 03/22/2021   GFRAA 101 06/26/2019    Lab Results  Component Value Date   CEA 0.5 10/05/2020    Medications: I have reviewed the patient's current medications.   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, MSI-high, loss of MLH1 and PMS2 expression, negative for BRAF V600E mutation, MLH1 hypermethylation absent  Negative CancerNext panel-VUS in the [facility] gene colonoscopy 10/05/2020-near completely obstructing mass at the hepatic flexure-could not be passed, polyps in the sigmoid and transverse colon CTs 10/15/2020-short segment wall thickening with adjacent inflammatory stranding at the hepatic flexure, prominent mesenteric nodes adjacent to the colonic neoplasm measuring up to 5 mm, prominent gastropathic, hepatoduodenal ligament, and right sided retroperitoneal nodes measured up to 7 mm, subcentimeter hypodense hypodensities-too small to characterize, tiny right middle lobe nodule 12/13/2020-ctDNA not detected Cycle 1 CAPOX 12/13/2020, Xeloda discontinued around 12/24/2020 Cycle 1 FOLFOX 01/24/2021 Cycle 2 FOLFOX 02/07/2021 Guardant reveal 02/16/2021-detected Cycle 3 FOLFOX 02/21/2021 Cycle 4 FOLFOX 03/08/2021, 5-FU dose escalated, home Decadron prophylaxis added Cycle 5 FOLFOX 03/22/2021   Tubular adenoma on the colonoscopy 10/05/2020 and on colonoscopy 06/14/2010 Hypertension [facility] admission 12/30/2020-acute gastroenteritis/colitis Negative for the IVS14+ DPYD mutation   Disposition: [name] will complete a final cycle of FOLFOX today.  He tolerated the last cycle with less nausea when Decadron prophylaxis was added.  He will complete home Decadron with this cycle. The soreness at the distal left nostril is like related to mucositis from 5-fluorouracil.  He will use Vaseline.  He has mild oxaliplatin neuropathy symptoms.  We discussed the positive Guardant reveal study from 02/16/2021.  He will have a repeat Guardant test when he is here in 2 weeks.  If  positive again I will refer him for restaging CTs.    Betsy Coder, MD  03/22/2021  10:53 AM

## 2021-03-22 NOTE — Patient Instructions (Signed)
[facility]   °Discharge Instructions: °Thank you for choosing [facility] to provide your oncology and hematology care.  ° °If you have a lab appointment with the [facility], please go directly to the [facility] and check in at the registration area. °  °Wear comfortable clothing and clothing appropriate for easy access to any Portacath or PICC line.  ° °We strive to give you quality time with your provider. You may need to reschedule your appointment if you arrive late (15 or more minutes).  Arriving late affects you and other patients whose appointments are after yours.  Also, if you miss three or more appointments without notifying the office, you may be dismissed from the clinic at the provider’s discretion.    °  °For prescription refill requests, have your pharmacy contact our office and allow 72 hours for refills to be completed.   ° °Today you received the following chemotherapy and/or immunotherapy agents Oxaliplatin (ELOXATIN), Leucovorin & Flourouracil (ADRUCIL).    °  °To help prevent nausea and vomiting after your treatment, we encourage you to take your nausea medication as directed. ° °BELOW ARE SYMPTOMS THAT SHOULD BE REPORTED IMMEDIATELY: °*FEVER GREATER THAN 100.4 F (38 °C) OR HIGHER °*CHILLS OR SWEATING °*NAUSEA AND VOMITING THAT IS NOT CONTROLLED WITH YOUR NAUSEA MEDICATION °*UNUSUAL SHORTNESS OF BREATH °*UNUSUAL BRUISING OR BLEEDING °*URINARY PROBLEMS (pain or burning when urinating, or frequent urination) °*BOWEL PROBLEMS (unusual diarrhea, constipation, pain near the anus) °TENDERNESS IN MOUTH AND THROAT WITH OR WITHOUT PRESENCE OF ULCERS (sore throat, sores in mouth, or a toothache) °UNUSUAL RASH, SWELLING OR PAIN  °UNUSUAL VAGINAL DISCHARGE OR ITCHING  ° °Items with * indicate a potential emergency and should be followed up as soon as possible or go to the Emergency Department if any problems should occur. ° °Please show the CHEMOTHERAPY ALERT  CARD or IMMUNOTHERAPY ALERT CARD at check-in to the Emergency Department and triage nurse. ° °Should you have questions after your visit or need to cancel or reschedule your appointment, please contact [facility]  Dept: [phone]  and follow the prompts.  Office hours are 8:00 a.m. to 4:30 p.m. Monday - Friday. Please note that voicemails left after 4:00 p.m. may not be returned until the following business day.  We are closed weekends and major holidays. You have access to a nurse at all times for urgent questions. Please call the main number to the clinic Dept: [phone] and follow the prompts. ° ° °For any non-urgent questions, you may also contact your provider using MyChart. We now offer e-Visits for anyone 18 and older to request care online for non-urgent symptoms. For details visit [email or web address]. °  °Also download the MyChart app! Go to the app store, search "MyChart", open the app, select , and log in with your MyChart username and password. ° °Due to Covid, a mask is required upon entering the hospital/clinic. If you do not have a mask, one will be given to you upon arrival. For doctor visits, patients may have 1 support person aged 18 or older with them. For treatment visits, patients cannot have anyone with them due to current Covid guidelines and our immunocompromised population.  ° °Oxaliplatin Injection °What is this medication? °OXALIPLATIN (ox AL i PLA tin) is a chemotherapy drug. It targets fast dividing cells, like cancer cells, and causes these cells to die. This medicine is used to treat cancers of the colon and rectum, and   many other cancers. This medicine may be used for other purposes; ask your health care provider or pharmacist if you have questions. COMMON BRAND NAME(S): Eloxatin What should I tell my care team before I take this medication? They need to know if you have any of these conditions: heart disease history of irregular  heartbeat liver disease low blood counts, like white cells, platelets, or red blood cells lung or breathing disease, like asthma take medicines that treat or prevent blood clots tingling of the fingers or toes, or other nerve disorder an unusual or allergic reaction to oxaliplatin, other chemotherapy, other medicines, foods, dyes, or preservatives pregnant or trying to get pregnant breast-feeding How should I use this medication? This drug is given as an infusion into a vein. It is administered in a hospital or clinic by a specially trained health care professional. Talk to your pediatrician regarding the use of this medicine in children. Special care may be needed. Overdosage: If you think you have taken too much of this medicine contact a poison control [facility] or emergency room at once. NOTE: This medicine is only for you. Do not share this medicine with others. What if I miss a dose? It is important not to miss a dose. Call your doctor or health care professional if you are unable to keep an appointment. What may interact with this medication? Do not take this medicine with any of the following medications: cisapride dronedarone pimozide thioridazine This medicine may also interact with the following medications: aspirin and aspirin-like medicines certain medicines that treat or prevent blood clots like warfarin, apixaban, dabigatran, and rivaroxaban cisplatin cyclosporine diuretics medicines for infection like acyclovir, adefovir, amphotericin B, bacitracin, cidofovir, foscarnet, ganciclovir, gentamicin, pentamidine, vancomycin NSAIDs, medicines for pain and inflammation, like ibuprofen or naproxen other medicines that prolong the QT interval (an abnormal heart rhythm) pamidronate zoledronic acid This list may not describe all possible interactions. Give your health care provider a list of all the medicines, herbs, non-prescription drugs, or dietary supplements you use. Also tell  them if you smoke, drink alcohol, or use illegal drugs. Some items may interact with your medicine. What should I watch for while using this medication? Your condition will be monitored carefully while you are receiving this medicine. You may need blood work done while you are taking this medicine. This medicine may make you feel generally unwell. This is not uncommon as chemotherapy can affect healthy cells as well as cancer cells. Report any side effects. Continue your course of treatment even though you feel ill unless your healthcare professional tells you to stop. This medicine can make you more sensitive to cold. Do not drink cold drinks or use ice. Cover exposed skin before coming in contact with cold temperatures or cold objects. When out in cold weather wear warm clothing and cover your mouth and nose to warm the air that goes into your lungs. Tell your doctor if you get sensitive to the cold. Do not become pregnant while taking this medicine or for 9 months after stopping it. Women should inform their health care professional if they wish to become pregnant or think they might be pregnant. Men should not father a child while taking this medicine and for 6 months after stopping it. There is potential for serious side effects to an unborn child. Talk to your health care professional for more information. Do not breast-feed a child while taking this medicine or for 3 months after stopping it. This medicine has caused ovarian failure in  some women. This medicine may make it more difficult to get pregnant. Talk to your health care professional if you are concerned about your fertility. °This medicine has caused decreased sperm counts in some men. This may make it more difficult to father a child. Talk to your health care professional if you are concerned about your fertility. °This medicine may increase your risk of getting an infection. Call your health care professional for advice if you get a fever,  chills, or sore throat, or other symptoms of a cold or flu. Do not treat yourself. Try to avoid being around people who are sick. °Avoid taking medicines that contain aspirin, acetaminophen, ibuprofen, naproxen, or ketoprofen unless instructed by your health care professional. These medicines may hide a fever. °Be careful brushing or flossing your teeth or using a toothpick because you may get an infection or bleed more easily. If you have any dental work done, tell your dentist you are receiving this medicine. °What side effects may I notice from receiving this medication? °Side effects that you should report to your doctor or health care professional as soon as possible: °allergic reactions like skin rash, itching or hives, swelling of the face, lips, or tongue °breathing problems °cough °low blood counts - this medicine may decrease the number of white blood cells, red blood cells, and platelets. You may be at increased risk for infections and bleeding °nausea, vomiting °pain, redness, or irritation at site where injected °pain, tingling, numbness in the hands or feet °signs and symptoms of bleeding such as bloody or black, tarry stools; red or dark brown urine; spitting up blood or brown material that looks like coffee grounds; red spots on the skin; unusual bruising or bleeding from the eyes, gums, or nose °signs and symptoms of a dangerous change in heartbeat or heart rhythm like chest pain; dizziness; fast, irregular heartbeat; palpitations; feeling faint or lightheaded; falls °signs and symptoms of infection like fever; chills; cough; sore throat; pain or trouble passing urine °signs and symptoms of liver injury like dark yellow or brown urine; general ill feeling or flu-like symptoms; light-colored stools; loss of appetite; nausea; right upper belly pain; unusually weak or tired; yellowing of the eyes or skin °signs and symptoms of low red blood cells or anemia such as unusually weak or tired; feeling faint  or lightheaded; falls °signs and symptoms of muscle injury like dark urine; trouble passing urine or change in the amount of urine; unusually weak or tired; muscle pain; back pain °Side effects that usually do not require medical attention (report to your doctor or health care professional if they continue or are bothersome): °changes in taste °diarrhea °gas °hair loss °loss of appetite °mouth sores °This list may not describe all possible side effects. Call your doctor for medical advice about side effects. You may report side effects to FDA at 1-800-FDA-1088. °Where should I keep my medication? °This drug is given in a hospital or clinic and will not be stored at home. °NOTE: This sheet is a summary. It may not cover all possible information. If you have questions about this medicine, talk to your doctor, pharmacist, or health care provider. °© 2022 [employer] (2020-11-16 00:00:00) ° °Leucovorin injection °What is this medication? °LEUCOVORIN (loo koe VOR in) is used to prevent or treat the harmful effects of some medicines. This medicine is used to treat anemia caused by a low amount of folic acid in the body. It is also used with 5-fluorouracil (5-FU) to treat colon   cancer. This medicine may be used for other purposes; ask your health care provider or pharmacist if you have questions. What should I tell my care team before I take this medication? They need to know if you have any of these conditions: anemia from low levels of vitamin B-12 in the blood an unusual or allergic reaction to leucovorin, folic acid, other medicines, foods, dyes, or preservatives pregnant or trying to get pregnant breast-feeding How should I use this medication? This medicine is for injection into a muscle or into a vein. It is given by a health care professional in a hospital or clinic setting. Talk to your pediatrician regarding the use of this medicine in children. Special care may be needed. Overdosage: If you  think you have taken too much of this medicine contact a poison control [facility] or emergency room at once. NOTE: This medicine is only for you. Do not share this medicine with others. What if I miss a dose? This does not apply. What may interact with this medication? capecitabine fluorouracil phenobarbital phenytoin primidone trimethoprim-sulfamethoxazole This list may not describe all possible interactions. Give your health care provider a list of all the medicines, herbs, non-prescription drugs, or dietary supplements you use. Also tell them if you smoke, drink alcohol, or use illegal drugs. Some items may interact with your medicine. What should I watch for while using this medication? Your condition will be monitored carefully while you are receiving this medicine. This medicine may increase the side effects of 5-fluorouracil, 5-FU. Tell your doctor or health care professional if you have diarrhea or mouth sores that do not get better or that get worse. What side effects may I notice from receiving this medication? Side effects that you should report to your doctor or health care professional as soon as possible: allergic reactions like skin rash, itching or hives, swelling of the face, lips, or tongue breathing problems fever, infection mouth sores unusual bleeding or bruising unusually weak or tired Side effects that usually do not require medical attention (report to your doctor or health care professional if they continue or are bothersome): constipation or diarrhea loss of appetite nausea, vomiting This list may not describe all possible side effects. Call your doctor for medical advice about side effects. You may report side effects to FDA at 1-800-FDA-1088. Where should I keep my medication? This drug is given in a hospital or clinic and will not be stored at home. NOTE: This sheet is a summary. It may not cover all possible information. If you have questions about this  medicine, talk to your doctor, pharmacist, or health care provider.  2022 Else[employer]/Gold Standard (2007-09-05 00:00:00)  Fluorouracil, 5-FU injection What is this medication? FLUOROURACIL, 5-FU (flure oh YOOR a sil) is a chemotherapy drug. It slows the growth of cancer cells. This medicine is used to treat many types of cancer like breast cancer, colon or rectal cancer, pancreatic cancer, and stomach cancer. This medicine may be used for other purposes; ask your health care provider or pharmacist if you have questions. COMMON BRAND NAME(S): Adrucil What should I tell my care team before I take this medication? They need to know if you have any of these conditions: blood disorders dihydropyrimidine dehydrogenase (DPD) deficiency infection (especially a virus infection such as chickenpox, cold sores, or herpes) kidney disease liver disease malnourished, poor nutrition recent or ongoing radiation therapy an unusual or allergic reaction to fluorouracil, other chemotherapy, other medicines, foods, dyes, or preservatives pregnant or trying to get pregnant  breast-feeding How should I use this medication? This drug is given as an infusion or injection into a vein. It is administered in a hospital or clinic by a specially trained health care professional. Talk to your pediatrician regarding the use of this medicine in children. Special care may be needed. Overdosage: If you think you have taken too much of this medicine contact a poison control [facility] or emergency room at once. NOTE: This medicine is only for you. Do not share this medicine with others. What if I miss a dose? It is important not to miss your dose. Call your doctor or health care professional if you are unable to keep an appointment. What may interact with this medication? Do not take this medicine with any of the following medications: live virus vaccines This medicine may also interact with the following medications: medicines  that treat or prevent blood clots like warfarin, enoxaparin, and dalteparin This list may not describe all possible interactions. Give your health care provider a list of all the medicines, herbs, non-prescription drugs, or dietary supplements you use. Also tell them if you smoke, drink alcohol, or use illegal drugs. Some items may interact with your medicine. What should I watch for while using this medication? Visit your doctor for checks on your progress. This drug may make you feel generally unwell. This is not uncommon, as chemotherapy can affect healthy cells as well as cancer cells. Report any side effects. Continue your course of treatment even though you feel ill unless your doctor tells you to stop. In some cases, you may be given additional medicines to help with side effects. Follow all directions for their use. Call your doctor or health care professional for advice if you get a fever, chills or sore throat, or other symptoms of a cold or flu. Do not treat yourself. This drug decreases your body's ability to fight infections. Try to avoid being around people who are sick. This medicine may increase your risk to bruise or bleed. Call your doctor or health care professional if you notice any unusual bleeding. Be careful brushing and flossing your teeth or using a toothpick because you may get an infection or bleed more easily. If you have any dental work done, tell your dentist you are receiving this medicine. Avoid taking products that contain aspirin, acetaminophen, ibuprofen, naproxen, or ketoprofen unless instructed by your doctor. These medicines may hide a fever. Do not become pregnant while taking this medicine. Women should inform their doctor if they wish to become pregnant or think they might be pregnant. There is a potential for serious side effects to an unborn child. Talk to your health care professional or pharmacist for more information. Do not breast-feed an infant while taking  this medicine. Men should inform their doctor if they wish to father a child. This medicine may lower sperm counts. Do not treat diarrhea with over the counter products. Contact your doctor if you have diarrhea that lasts more than 2 days or if it is severe and watery. This medicine can make you more sensitive to the sun. Keep out of the sun. If you cannot avoid being in the sun, wear protective clothing and use sunscreen. Do not use sun lamps or tanning beds/booths. What side effects may I notice from receiving this medication? Side effects that you should report to your doctor or health care professional as soon as possible: allergic reactions like skin rash, itching or hives, swelling of the face, lips, or tongue low blood  counts - this medicine may decrease the number of white blood cells, red blood cells and platelets. You may be at increased risk for infections and bleeding. signs of infection - fever or chills, cough, sore throat, pain or difficulty passing urine signs of decreased platelets or bleeding - bruising, pinpoint red spots on the skin, black, tarry stools, blood in the urine signs of decreased red blood cells - unusually weak or tired, fainting spells, lightheadedness breathing problems changes in vision chest pain mouth sores nausea and vomiting pain, swelling, redness at site where injected pain, tingling, numbness in the hands or feet redness, swelling, or sores on hands or feet stomach pain unusual bleeding Side effects that usually do not require medical attention (report to your doctor or health care professional if they continue or are bothersome): changes in finger or toe nails diarrhea dry or itchy skin hair loss headache loss of appetite sensitivity of eyes to the light stomach upset unusually teary eyes This list may not describe all possible side effects. Call your doctor for medical advice about side effects. You may report side effects to FDA at  1-800-FDA-1088. Where should I keep my medication? This drug is given in a hospital or clinic and will not be stored at home. NOTE: This sheet is a summary. It may not cover all possible information. If you have questions about this medicine, talk to your doctor, pharmacist, or health care provider.  2022 [employer] Standard (2020-11-16 00:00:00)  The chemotherapy medication bag should finish at 46 hours, 96 hours, or 7 days. For example, if your pump is scheduled for 46 hours and it was put on at 4:00 p.m., it should finish at 2:00 p.m. the day it is scheduled to come off regardless of your appointment time.     Estimated time to finish at 1:00 p.m. on Thursday 03/24/2021.   If the display on your pump reads "Low Volume" and it is beeping, take the batteries out of the pump and come to the [facility] for it to be taken off.   If the pump alarms go off prior to the pump reading "Low Volume" then call [phone] and someone can assist you.  If the plunger comes out and the chemotherapy medication is leaking out, please use your home chemo spill kit to clean up the spill. Do NOT use paper towels or other household products.  If you have problems or questions regarding your pump, please call either [phone] (24 hours a day) or the [facility] Monday-Friday 8:00 a.m.- 4:30 p.m. at the clinic number and we will assist you. If you are unable to get assistance, then go to the nearest Emergency Department and ask the staff to contact the IV team for assistance.

## 2021-03-22 NOTE — Progress Notes (Signed)
Patient presents for treatment. RN assessment completed along with the following:  Labs/vitals reviewed - Yes, and within treatment parameters.   Weight within 10% of previous measurement - Yes Oncology Treatment Attestation completed for current therapy- Yes, on date 12/29/2020 Informed consent completed and reflects current therapy/intent - Yes, on date 01/24/2021             Provider progress note reviewed - Today's provider note is not yet available. I reviewed the most recent oncology provider progress note in chart dated 03/08/2021. Treatment/Antibody/Supportive plan reviewed - Yes, and there are no adjustments needed for today's treatment. S&H and other orders reviewed - Yes, and there are no additional orders identified. Previous treatment date reviewed - Yes, and the appropriate amount of time has elapsed between treatments. Clinic Hand Off Received from - Yes from [facility], RN  Patient to proceed with treatment.

## 2021-03-22 NOTE — Progress Notes (Signed)
Per Dr. Benay Spice: Verbal order given to d/c PICC line on 03/24/21 pump d/c appointment.

## 2021-03-24 ENCOUNTER — Inpatient Hospital Stay: Payer: 59

## 2021-03-24 ENCOUNTER — Other Ambulatory Visit: Payer: Self-pay

## 2021-03-24 VITALS — BP 128/82 | HR 71 | Temp 98.5°F | Resp 18

## 2021-03-24 DIAGNOSIS — Z5111 Encounter for antineoplastic chemotherapy: Secondary | ICD-10-CM | POA: Diagnosis not present

## 2021-03-24 DIAGNOSIS — C184 Malignant neoplasm of transverse colon: Secondary | ICD-10-CM

## 2021-03-24 MED ORDER — SODIUM CHLORIDE 0.9% FLUSH
10.0000 mL | INTRAVENOUS | Status: DC | PRN
  Administered 2021-03-24: 10 mL

## 2021-03-24 MED ORDER — HEPARIN SOD (PORK) LOCK FLUSH 100 UNIT/ML IV SOLN
500.0000 [IU] | Freq: Once | INTRAVENOUS | Status: AC | PRN
  Administered 2021-03-24: 500 [IU]

## 2021-03-24 NOTE — Patient Instructions (Signed)
PICC Removal, Adult, Care After The following information offers guidance on how to care for yourself after your procedure. Your health care provider may also give you more specific instructions. If you have problems or questions, contact your health care provider. What can I expect after the procedure? After the procedure, it is common to have: Tenderness or soreness. Redness, swelling, or a scab at the place where your PICC was removed (exit site). Follow these instructions at home: For the first 24 hours after the procedure: Keep the bandage (dressing) on your exit site clean and dry. Do not remove your dressing until your health care provider tells you to do so. Do not lift anything heavy or do activities that require great effort until your health care provider says it is okay. You should avoid: Lifting weights. Doing yard work. Doing any physical activity with repetitive arm movement. Watch closely for any signs of an air bubble in the vein (air embolism). This is a rare but serious complication. Signs of an air embolism include trouble breathing, wheezing, chest pain, or a fast pulse. If you have signs of an air embolism, call 911 right away and lie down on your left side to keep the air from moving into your lungs. After 24 hours have passed:  Remove your dressing as told by your health care provider. Wash your hands with soap and water for at least 20 seconds before and after you change your dressing. If soap and water are not available, use hand sanitizer. Return to your normal activities as told by your health care provider. A small scab may develop over the exit site. Do not pick at the scab. When bathing or showering, gently wash the exit site with soap and water. Pat it dry. Watch for signs of infection, such as: A fever or chills. Swollen glands under your arm. More redness, swelling, or soreness around your arm. Blood, fluid, or pus coming from your exit site. Warmth or a  bad smell coming from your exit site. A red streak spreading away from your exit site. General instructions Take over-the-counter and prescription medicines only as told by your health care provider. Do not take any new medicines without checking with your health care provider first. If you were given an antibiotic ointment, apply it as told by your health care provider. Keep all follow-up visits. This is important. Contact a health care provider if: You have a fever or chills. You have swelling at your exit site or swollen glands under your arm. You have signs of infection at your exit site. You have soreness, redness, or swelling in your arm that gets worse. Get help right away if: You have numbness or tingling in your fingers, hand, or arm. Your arm looks blue and feels cold. You have signs of an air embolism, such as trouble breathing, wheezing, chest pain, or a fast pulse. These symptoms may be an emergency. Get medical help right away. Call 911. Do not wait to see if the symptoms will go away. Do not drive yourself to the hospital. Summary After a PICC is removed, it is common to have tenderness or soreness, redness, swelling, or a scab at the exit site. Keep the bandage (dressing) over the exit site clean and dry. Do not remove the dressing until your health care provider tells you to do so. Do not lift anything heavy or do activities that require great effort until your health care provider says it is okay. Watch closely for any signs  of an air bubble (air embolism). If you have signs of an air embolism, call 911 right away and lie down on your left side. This information is not intended to replace advice given to you by your health care provider. Make sure you discuss any questions you have with your health care provider. Document Revised: 09/15/2020 Document Reviewed: 09/15/2020 Elsevier Patient Education  [place].

## 2021-03-24 NOTE — Progress Notes (Signed)
PICC Removal Note:  S: Patient presents today for  PICC Double Lumen removal. Per collab nurse, Merceda Elks, RN's progress note from 03/22/2021, verbal order was received from Dr. Benay Spice  to d/c PICC line today 03/24/2021.   O: PICC line removed from Right Basilic antecubital after sterile site prepped per protocol at 1330. PICC catheter tip visualized and intact (39 cm). Pressure dressing applied with Vaseline Gauze and  tape.   A: No redness, ecchymosis, edema, swelling, or drainage noted at site. Patient tolerated procedure well without any problems. Observed in supine position post PICC d/c for 30 minutes without any problems.  P: Instructions provided on post PICC discharge care, including followup notification instructions.

## 2021-04-02 ENCOUNTER — Other Ambulatory Visit: Payer: Self-pay | Admitting: Oncology

## 2021-04-07 ENCOUNTER — Inpatient Hospital Stay: Payer: 59

## 2021-04-07 ENCOUNTER — Other Ambulatory Visit: Payer: Self-pay

## 2021-04-07 ENCOUNTER — Encounter: Payer: Self-pay | Admitting: Nurse Practitioner

## 2021-04-07 ENCOUNTER — Other Ambulatory Visit: Payer: 59

## 2021-04-07 ENCOUNTER — Inpatient Hospital Stay (HOSPITAL_BASED_OUTPATIENT_CLINIC_OR_DEPARTMENT_OTHER): Payer: 59 | Admitting: Nurse Practitioner

## 2021-04-07 VITALS — BP 110/77 | HR 66 | Temp 97.8°F | Resp 20 | Ht 76.0 in | Wt 213.0 lb

## 2021-04-07 DIAGNOSIS — C184 Malignant neoplasm of transverse colon: Secondary | ICD-10-CM

## 2021-04-07 DIAGNOSIS — Z5111 Encounter for antineoplastic chemotherapy: Secondary | ICD-10-CM | POA: Diagnosis not present

## 2021-04-07 LAB — CMP (CANCER CENTER ONLY)
ALT: 38 U/L (ref 0–44)
AST: 36 U/L (ref 15–41)
Albumin: 4.3 g/dL (ref 3.5–5.0)
Alkaline Phosphatase: 56 U/L (ref 38–126)
Anion gap: 7 (ref 5–15)
BUN: 11 mg/dL (ref 8–23)
CO2: 30 mmol/L (ref 22–32)
Calcium: 9.9 mg/dL (ref 8.9–10.3)
Chloride: 104 mmol/L (ref 98–111)
Creatinine: 0.95 mg/dL (ref 0.61–1.24)
GFR, Estimated: >60 mL/min (ref >60)
Glucose, Bld: 110 mg/dL — ABNORMAL HIGH (ref 70–99)
Potassium: 4.1 mmol/L (ref 3.5–5.1)
Sodium: 141 mmol/L (ref 135–145)
Total Bilirubin: 0.8 mg/dL (ref 0.3–1.2)
Total Protein: 6.8 g/dL (ref 6.5–8.1)

## 2021-04-07 LAB — CBC WITH DIFFERENTIAL (CANCER CENTER ONLY)
Abs Immature Granulocytes: 0.03 10*3/uL (ref 0.00–0.07)
Basophils Absolute: 0 10*3/uL (ref 0.0–0.1)
Basophils Relative: 0 %
Eosinophils Absolute: 0.1 10*3/uL (ref 0.0–0.5)
Eosinophils Relative: 2 %
HCT: 40.8 % (ref 39.0–52.0)
Hemoglobin: 13.8 g/dL (ref 13.0–17.0)
Immature Granulocytes: 1 %
Lymphocytes Relative: 20 %
Lymphs Abs: 1 10*3/uL (ref 0.7–4.0)
MCH: 29.6 pg (ref 26.0–34.0)
MCHC: 33.8 g/dL (ref 30.0–36.0)
MCV: 87.4 fL (ref 80.0–100.0)
Monocytes Absolute: 0.6 10*3/uL (ref 0.1–1.0)
Monocytes Relative: 13 %
Neutro Abs: 3.1 10*3/uL (ref 1.7–7.7)
Neutrophils Relative %: 64 %
Platelet Count: 149 10*3/uL — ABNORMAL LOW (ref 150–400)
RBC: 4.67 MIL/uL (ref 4.22–5.81)
RDW: 15.9 % — ABNORMAL HIGH (ref 11.5–15.5)
WBC Count: 4.8 10*3/uL (ref 4.0–10.5)
nRBC: 0 % (ref 0.0–0.2)

## 2021-04-07 NOTE — Progress Notes (Signed)
[facility] OFFICE PROGRESS NOTE   Diagnosis: Colon cancer  INTERVAL HISTORY:   Mr. Bolar returns as scheduled.  He completed the final cycle of FOLFOX 03/22/2021.  He has mild intermittent nausea.  No mouth sores.  He manages his bowels with diet.  He has constant numbness/tingling in the fingertips and toes.  Objective:  Vital signs in last 24 hours:  Blood pressure 110/77, pulse 66, temperature 97.8 F (36.6 C), temperature source Oral, resp. rate 20, height _0  (1.93 m), weight 213 lb (96.6 kg), SpO2 100 %.    HEENT: No thrush or ulcers. Resp: Lungs clear bilaterally. Cardio: Regular rate and rhythm. GI: Abdomen soft and nontender.  No hepatomegaly. Vascular: No leg edema. Port-A-Cath without erythema.  Lab Results:  Lab Results  Component Value Date   WBC 4.8 04/07/2021   HGB 13.8 04/07/2021   HCT 40.8 04/07/2021   MCV 87.4 04/07/2021   PLT 149 (L) 04/07/2021   NEUTROABS 3.1 04/07/2021    Imaging:  No results found.  Medications: I have reviewed the patient's current medications.  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, MSI-high, loss of MLH1 and PMS2 expression, negative for BRAF V600E mutation, MLH1 hypermethylation absent  Negative CancerNext panel-VUS in the [facility] gene colonoscopy 10/05/2020-near completely obstructing mass at the hepatic flexure-could not be passed, polyps in the sigmoid and transverse colon CTs 10/15/2020-short segment wall thickening with adjacent inflammatory stranding at the hepatic flexure, prominent mesenteric nodes adjacent to the colonic neoplasm measuring up to 5 mm, prominent gastropathic, hepatoduodenal ligament, and right sided retroperitoneal nodes measured up to 7 mm, subcentimeter hypodense hypodensities-too small to  characterize, tiny right middle lobe nodule 12/13/2020-ctDNA not detected Cycle 1 CAPOX 12/13/2020, Xeloda discontinued around 12/24/2020 Cycle 1 FOLFOX 01/24/2021 Cycle 2 FOLFOX 02/07/2021 Guardant reveal 02/16/2021-detected Cycle 3 FOLFOX 02/21/2021 Cycle 4 FOLFOX 03/08/2021, 5-FU dose escalated, home Decadron prophylaxis added Cycle 5 FOLFOX 03/22/2021 Guardant reveal 04/07/2021-pending   Tubular adenoma on the colonoscopy 10/05/2020 and on colonoscopy 06/14/2010 Hypertension [facility] admission 12/30/2020-acute gastroenteritis/colitis Negative for the IVS14+ DPYD mutation      Disposition: Dylan Fischer appears stable.  He completed the course of chemotherapy 03/22/2021.  Guardant reveal testing repeated today.  We will contact him once that result is available.  If positive he will be referred for CT scans.  Otherwise he will return for a CEA and follow-up visit in 3 months.  Plan reviewed with Dr. Benay Spice.    Ned Card ANP/GNP-BC   04/07/2021  11:53 AM

## 2021-04-21 ENCOUNTER — Other Ambulatory Visit: Payer: Self-pay | Admitting: *Deleted

## 2021-04-21 DIAGNOSIS — C184 Malignant neoplasm of transverse colon: Secondary | ICD-10-CM

## 2021-04-21 NOTE — Progress Notes (Signed)
Placed order for repeat Guardant Reveal testing on April appointment to f/u on last negative result of 04/08/21 since test prior was positive.

## 2021-05-30 ENCOUNTER — Other Ambulatory Visit: Payer: Self-pay | Admitting: Family Medicine

## 2021-05-30 DIAGNOSIS — E785 Hyperlipidemia, unspecified: Secondary | ICD-10-CM

## 2021-06-11 ENCOUNTER — Encounter: Payer: Self-pay | Admitting: Internal Medicine

## 2021-06-23 ENCOUNTER — Ambulatory Visit: Payer: 59 | Admitting: Emergency Medicine

## 2021-07-07 ENCOUNTER — Ambulatory Visit: Payer: 59 | Admitting: Oncology

## 2021-07-07 ENCOUNTER — Other Ambulatory Visit: Payer: 59

## 2021-07-07 ENCOUNTER — Other Ambulatory Visit: Payer: Self-pay | Admitting: Family Medicine

## 2021-07-07 DIAGNOSIS — I10 Essential (primary) hypertension: Secondary | ICD-10-CM

## 2021-07-07 MED ORDER — OLMESARTAN MEDOXOMIL-HCTZ 20-12.5 MG PO TABS: ORAL_TABLET | ORAL | 0 refills | Status: DC

## 2021-07-12 NOTE — Progress Notes (Signed)
?  Dylan Fischer ?OFFICE PROGRESS NOTE ? ? ?Diagnosis: Colon cancer ? ?INTERVAL HISTORY:  ? ?Dylan Fischer returns as scheduled.  Good appetite.  No difficulty with bowel function.  No bleeding.  Neuropathy symptoms in the hands have improved.  He continues to have numbness, tingling, and discomfort in the feet.  The foot discomfort improves after ambulating. ? ?Objective: ? ?Vital signs in last 24 hours: ? ?Blood pressure 122/81, pulse 76, temperature 97.9 ?F (36.6 ?C), temperature source Oral, resp. rate 18, height 6' 4" (1.93 m), weight 227 lb (103 kg), SpO2 98 %. ?  ? ?Lymphatics: No cervical, supraclavicular, axillary, or inguinal nodes ?Resp: Lungs clear bilaterally ?Cardio: Regular rate and rhythm ?GI: No hepatosplenomegaly ?Vascular: No leg edema  ? ? ?Lab Results: ? ?Lab Results  ?Component Value Date  ? WBC 4.8 04/07/2021  ? HGB 13.8 04/07/2021  ? HCT 40.8 04/07/2021  ? MCV 87.4 04/07/2021  ? PLT 149 (L) 04/07/2021  ? NEUTROABS 3.1 04/07/2021  ? ? ?CMP  ?Lab Results  ?Component Value Date  ? NA 141 04/07/2021  ? K 4.1 04/07/2021  ? CL 104 04/07/2021  ? CO2 30 04/07/2021  ? GLUCOSE 110 (H) 04/07/2021  ? BUN 11 04/07/2021  ? CREATININE 0.95 04/07/2021  ? CALCIUM 9.9 04/07/2021  ? PROT 6.8 04/07/2021  ? ALBUMIN 4.3 04/07/2021  ? AST 36 04/07/2021  ? ALT 38 04/07/2021  ? ALKPHOS 56 04/07/2021  ? BILITOT 0.8 04/07/2021  ? GFRNONAA >60 04/07/2021  ? GFRAA 101 06/26/2019  ? ? ?Lab Results  ?Component Value Date  ? CEA 1.41 07/13/2021  ? ? ? ? ?Medications: I have reviewed the patient's current medications. ? ? ?Assessment/Plan: ? ?Colon cancer, transverse, right hemicolectomy 11/11/2020, stage IIb (pT4apN0), separate 1.3 cm-T2 lesion adjacent to the larger tumor ?No lymphovascular or perineural invasion, G2-G3, 0/27 lymph nodes, both tumor with signet ring cell morphology, MSI-high, loss of MLH1 and PMS2 expression, negative for BRAF V600E mutation, MLH1 hypermethylation absent  ?Negative CancerNext  panel-VUS in the RECQL4 gene ?colonoscopy 10/05/2020-near completely obstructing mass at the hepatic flexure-could not be passed, polyps in the sigmoid and transverse colon ?CTs 10/15/2020-short segment wall thickening with adjacent inflammatory stranding at the hepatic flexure, prominent mesenteric nodes adjacent to the colonic neoplasm measuring up to 5 mm, prominent gastropathic, hepatoduodenal ligament, and right sided retroperitoneal nodes measured up to 7 mm, subcentimeter hypodense hypodensities-too small to characterize, tiny right middle lobe nodule ?12/13/2020-ctDNA not detected ?Cycle 1 CAPOX 12/13/2020, Xeloda discontinued around 12/24/2020 ?Cycle 1 FOLFOX 01/24/2021 ?Cycle 2 FOLFOX 02/07/2021 ?Guardant reveal 02/16/2021-detected ?Cycle 3 FOLFOX 02/21/2021 ?Cycle 4 FOLFOX 03/08/2021, 5-FU dose escalated, home Decadron prophylaxis added ?Cycle 5 FOLFOX 03/22/2021 ?Guardant reveal 04/07/2021-ct DNA not detected ?  ?Tubular adenoma on the colonoscopy 10/05/2020 and on colonoscopy 06/14/2010 ?Hypertension ?Ocular rosacea ?Hospital admission 12/30/2020-acute gastroenteritis/colitis ?Negative for the IVS14+ DPYD mutation ?6.  Oxaliplatin neuropathy ?  ?  ? ?Disposition: ?Dylan Fischer in clinical remission from colon cancer.  We will follow-up on the CEA and Guardant reveal testing from today. ? ?He will return for an office visit and restaging CTs in approximately 4 months.  He will be due for a surveillance colonoscopy this summer. ? ?We hope the neuropathy symptoms will improve over the next several months.  He will call for worsening symptoms and we will try medical therapy. ? ?Betsy Coder, MD ? ?07/13/2021  ?12:11 PM ? ? ?

## 2021-07-13 ENCOUNTER — Inpatient Hospital Stay: Payer: 59 | Attending: Nurse Practitioner

## 2021-07-13 ENCOUNTER — Encounter: Payer: Self-pay | Admitting: Oncology

## 2021-07-13 ENCOUNTER — Inpatient Hospital Stay (HOSPITAL_BASED_OUTPATIENT_CLINIC_OR_DEPARTMENT_OTHER): Payer: 59 | Admitting: Oncology

## 2021-07-13 ENCOUNTER — Telehealth: Payer: Self-pay

## 2021-07-13 VITALS — BP 122/81 | HR 76 | Temp 97.9°F | Resp 18 | Ht 76.0 in | Wt 227.0 lb

## 2021-07-13 DIAGNOSIS — Z79899 Other long term (current) drug therapy: Secondary | ICD-10-CM | POA: Insufficient documentation

## 2021-07-13 DIAGNOSIS — I10 Essential (primary) hypertension: Secondary | ICD-10-CM | POA: Insufficient documentation

## 2021-07-13 DIAGNOSIS — C184 Malignant neoplasm of transverse colon: Secondary | ICD-10-CM | POA: Insufficient documentation

## 2021-07-13 DIAGNOSIS — G620 Drug-induced polyneuropathy: Secondary | ICD-10-CM | POA: Diagnosis not present

## 2021-07-13 LAB — CEA (ACCESS): CEA (CHCC): 1.41 ng/mL (ref 0.00–5.00)

## 2021-07-13 NOTE — Telephone Encounter (Signed)
-----   Message from Ladell Pier, MD sent at 07/13/2021  3:58 PM EDT ----- ?Please call patient, CEA is normal, follow-up as scheduled ? ?

## 2021-07-13 NOTE — Telephone Encounter (Signed)
Called patient regarding CEA results and informed him of Dr. Gearldine Shown response listed below.  Patient verbalized understanding.  Reminded patient of upcoming appointment in August.  ?

## 2021-07-22 ENCOUNTER — Telehealth: Payer: Self-pay

## 2021-07-22 LAB — GUARDANT 360

## 2021-07-22 NOTE — Telephone Encounter (Signed)
Patient gave verbal understanding and had no further questions or concerns at this time 

## 2021-07-22 NOTE — Telephone Encounter (Signed)
-----   Message from Ladell Pier, MD sent at 07/22/2021 10:03 AM EDT ----- ?Please call patient, the Guardant test is negative for circulating tumor DNA, follow-up as scheduled ? ?

## 2021-08-10 ENCOUNTER — Other Ambulatory Visit: Payer: Self-pay | Admitting: Family Medicine

## 2021-08-10 DIAGNOSIS — E785 Hyperlipidemia, unspecified: Secondary | ICD-10-CM

## 2021-08-10 NOTE — Telephone Encounter (Signed)
Lvm for pt to call back to advise if he needed this before his appt. [place]

## 2021-08-11 ENCOUNTER — Other Ambulatory Visit: Payer: Self-pay | Admitting: Family Medicine

## 2021-08-11 DIAGNOSIS — E785 Hyperlipidemia, unspecified: Secondary | ICD-10-CM

## 2021-08-11 MED ORDER — OMEGA-3-ACID ETHYL ESTERS 1 G PO CAPS: 2.0000 | ORAL_CAPSULE | Freq: Two times a day (BID) | ORAL | 0 refills | Status: DC

## 2021-08-14 ENCOUNTER — Other Ambulatory Visit: Payer: Self-pay | Admitting: Family Medicine

## 2021-08-14 DIAGNOSIS — H10829 Rosacea conjunctivitis, unspecified eye: Secondary | ICD-10-CM

## 2021-08-15 NOTE — Telephone Encounter (Signed)
Is this okay to refill? 

## 2021-08-16 NOTE — Telephone Encounter (Signed)
Called patient, he said this is for his ocular rosacea. Offered virtual and he declined.

## 2021-08-18 ENCOUNTER — Ambulatory Visit (AMBULATORY_SURGERY_CENTER): Payer: 59 | Admitting: *Deleted

## 2021-08-18 VITALS — Ht 76.0 in | Wt 227.0 lb

## 2021-08-18 DIAGNOSIS — Z85038 Personal history of other malignant neoplasm of large intestine: Secondary | ICD-10-CM

## 2021-08-18 MED ORDER — NA SULFATE-K SULFATE-MG SULF 17.5-3.13-1.6 GM/177ML PO SOLN: 1.0000 | ORAL | 0 refills | Status: DC

## 2021-08-18 NOTE — Progress Notes (Signed)
Patient's pre-visit was done today over the phone with the patient. Name,DOB and address verified. Patient denies any allergies to Eggs and Soy. Patient denies any problems with anesthesia/sedation. Patient is not taking any diet pills or blood thinners. No home Oxygen. Insurance confirmed with patient. Pt notified to use good rx for RX.  Prep instructions sent to pt's MyChart-pt is aware. Patient understands to call us back with any questions or concerns. Patient is aware of our care-partner policy.   EMMI education assigned to the patient for the procedure, sent to [place].

## 2021-08-23 ENCOUNTER — Encounter: Payer: 59 | Admitting: Family Medicine

## 2021-08-25 ENCOUNTER — Ambulatory Visit: Payer: 59 | Admitting: Internal Medicine

## 2021-08-26 ENCOUNTER — Ambulatory Visit (INDEPENDENT_AMBULATORY_CARE_PROVIDER_SITE_OTHER): Payer: 59 | Admitting: Internal Medicine

## 2021-08-26 ENCOUNTER — Encounter: Payer: Self-pay | Admitting: Internal Medicine

## 2021-08-26 VITALS — Ht 76.0 in | Wt 225.0 lb

## 2021-08-26 DIAGNOSIS — I10 Essential (primary) hypertension: Secondary | ICD-10-CM

## 2021-08-26 DIAGNOSIS — I700 Atherosclerosis of aorta: Secondary | ICD-10-CM | POA: Diagnosis not present

## 2021-08-26 DIAGNOSIS — T451X5A Adverse effect of antineoplastic and immunosuppressive drugs, initial encounter: Secondary | ICD-10-CM

## 2021-08-26 DIAGNOSIS — E785 Hyperlipidemia, unspecified: Secondary | ICD-10-CM | POA: Diagnosis not present

## 2021-08-26 DIAGNOSIS — G620 Drug-induced polyneuropathy: Secondary | ICD-10-CM | POA: Insufficient documentation

## 2021-08-26 LAB — LIPID PANEL
Cholesterol: 125 mg/dL (ref 0–200)
HDL: 35.6 mg/dL — ABNORMAL LOW (ref >39.00)
LDL Cholesterol: 52 mg/dL (ref 0–99)
NonHDL: 89.84
Total CHOL/HDL Ratio: 4
Triglycerides: 190 mg/dL — ABNORMAL HIGH (ref 0.0–149.0)
VLDL: 38 mg/dL (ref 0.0–40.0)

## 2021-08-26 LAB — VITAMIN B12: Vitamin B-12: 236 pg/mL (ref 211–911)

## 2021-08-26 LAB — HEMOGLOBIN A1C: Hgb A1c MFr Bld: 5.6 % (ref 4.6–6.5)

## 2021-08-26 MED ORDER — OLMESARTAN MEDOXOMIL-HCTZ 20-12.5 MG PO TABS
ORAL_TABLET | ORAL | 3 refills | Status: DC
Start: 2021-08-26 — End: 2022-08-21

## 2021-08-26 MED ORDER — OMEGA-3-ACID ETHYL ESTERS 1 G PO CAPS: 2.0000 | ORAL_CAPSULE | Freq: Two times a day (BID) | ORAL | 3 refills | Status: DC

## 2021-08-26 MED ORDER — ROSUVASTATIN CALCIUM 40 MG PO TABS: 40.0000 mg | ORAL_TABLET | Freq: Every day | ORAL | 3 refills | Status: DC

## 2021-08-26 NOTE — Patient Instructions (Signed)
Try B12 or B complex to see if this helps with the feet.

## 2021-08-26 NOTE — Progress Notes (Signed)
   Subjective:   Patient ID: Dylan Fischer, male    DOB: 1961/01/13, 61 y.o.   MRN: [id number]  HPI The patient is a new 61 YO man coming in for follow up chronic conditions and concerns.  PMH, [facility], social history reviewed and updated  Review of Systems  Constitutional: Negative.   HENT: Negative.    Eyes: Negative.   Respiratory:  Negative for cough, chest tightness and shortness of breath.   Cardiovascular:  Negative for chest pain, palpitations and leg swelling.  Gastrointestinal:  Negative for abdominal distention, abdominal pain, constipation, diarrhea, nausea and vomiting.  Musculoskeletal: Negative.   Skin: Negative.   Neurological:  Positive for numbness.  Psychiatric/Behavioral: Negative.      Objective:  Physical Exam Constitutional:      Appearance: He is well-developed.  HENT:     Head: Normocephalic and atraumatic.  Cardiovascular:     Rate and Rhythm: Normal rate and regular rhythm.  Pulmonary:     Effort: Pulmonary effort is normal. No respiratory distress.     Breath sounds: Normal breath sounds. No wheezing or rales.  Abdominal:     General: Bowel sounds are normal. There is no distension.     Palpations: Abdomen is soft.     Tenderness: There is no abdominal tenderness. There is no rebound.  Musculoskeletal:     Cervical back: Normal range of motion.  Skin:    General: Skin is warm and dry.     Comments: Neuropathy bottom of feet  Neurological:     Mental Status: He is alert and oriented to person, place, and time.     Coordination: Coordination normal.     BP not filed and unable to retrieve after visit Vitals:   08/26/21 1354  Weight: 225 lb (102.1 kg)  Height: '6\' 4"'$  (1.93 m)    Assessment & Plan:

## 2021-08-26 NOTE — Assessment & Plan Note (Signed)
Checking lipid panel and adjust crestor 40 mg daily and lovaza 2 g BID. Refilled both today. Adjust as needed.

## 2021-08-26 NOTE — Assessment & Plan Note (Addendum)
BP at goal on olmesartan/hctz 20/12.5 mg daily. BP not recorded today due to error. Recent CMP at goal and no change indicated today.

## 2021-08-26 NOTE — Assessment & Plan Note (Signed)
Showed patient images from CT scan with slight atherosclerosis. He is already on crestor and lovaza. Checking lipid panel and adjust as needed. No further work up indicated at this time.

## 2021-08-26 NOTE — Assessment & Plan Note (Signed)
Checking B12 and advised to start B complex/B12 otc. If B12 level low will do shots to replete then start oral. Advised can have improvement in first 1-2 years after chemo. No medications needed today.

## 2021-08-31 ENCOUNTER — Ambulatory Visit (INDEPENDENT_AMBULATORY_CARE_PROVIDER_SITE_OTHER): Payer: 59

## 2021-08-31 DIAGNOSIS — T451X5A Adverse effect of antineoplastic and immunosuppressive drugs, initial encounter: Secondary | ICD-10-CM

## 2021-08-31 DIAGNOSIS — G620 Drug-induced polyneuropathy: Secondary | ICD-10-CM | POA: Diagnosis not present

## 2021-08-31 MED ORDER — CYANOCOBALAMIN 1000 MCG/ML IJ SOLN
1000.0000 ug | Freq: Once | INTRAMUSCULAR | Status: AC
  Administered 2021-08-31: 1000 ug via INTRAMUSCULAR

## 2021-08-31 NOTE — Progress Notes (Cosign Needed)
Pt here for monthly B12 injection per Dr. Sharlet Salina.   B12 1028mg given IM left deltoid and pt tolerated injection well.   Pt to schedule next B12 injection upon check out.

## 2021-09-07 ENCOUNTER — Encounter: Payer: Self-pay | Admitting: Internal Medicine

## 2021-09-12 ENCOUNTER — Ambulatory Visit (INDEPENDENT_AMBULATORY_CARE_PROVIDER_SITE_OTHER): Payer: 59 | Admitting: *Deleted

## 2021-09-12 DIAGNOSIS — E538 Deficiency of other specified B group vitamins: Secondary | ICD-10-CM

## 2021-09-12 MED ORDER — CYANOCOBALAMIN 1000 MCG/ML IJ SOLN
1000.0000 ug | Freq: Once | INTRAMUSCULAR | Status: AC
  Administered 2021-09-12: 1000 ug via INTRAMUSCULAR

## 2021-09-12 NOTE — Progress Notes (Signed)
Patient here for his b12 injection. Given in right deltoid. Patient tolerated well.  

## 2021-09-15 ENCOUNTER — Ambulatory Visit (AMBULATORY_SURGERY_CENTER): Payer: 59 | Admitting: Internal Medicine

## 2021-09-15 ENCOUNTER — Encounter: Payer: Self-pay | Admitting: Internal Medicine

## 2021-09-15 VITALS — BP 117/71 | HR 60 | Temp 98.4°F | Resp 10 | Ht 76.0 in | Wt 227.0 lb

## 2021-09-15 DIAGNOSIS — Z08 Encounter for follow-up examination after completed treatment for malignant neoplasm: Secondary | ICD-10-CM

## 2021-09-15 DIAGNOSIS — Z85038 Personal history of other malignant neoplasm of large intestine: Secondary | ICD-10-CM | POA: Diagnosis present

## 2021-09-15 DIAGNOSIS — D122 Benign neoplasm of ascending colon: Secondary | ICD-10-CM | POA: Diagnosis not present

## 2021-09-15 DIAGNOSIS — G620 Drug-induced polyneuropathy: Secondary | ICD-10-CM

## 2021-09-15 HISTORY — PX: COLONOSCOPY: SHX174

## 2021-09-15 HISTORY — DX: Adverse effect of antineoplastic and immunosuppressive drugs, initial encounter: G62.0

## 2021-09-15 MED ORDER — SODIUM CHLORIDE 0.9 % IV SOLN: 500.0000 mL | Freq: Once | INTRAVENOUS | Status: DC

## 2021-09-15 NOTE — Progress Notes (Signed)
HISTORY OF PRESENT ILLNESS:  Dylan Fischer is a 61 y.o. male who was diagnosed with colon cancer July 2022.  Status post right hemicolectomy September 2022.  Has completed adjuvant chemotherapy.  Now for follow-up  REVIEW OF SYSTEMS:  All non-GI ROS negative.  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   Dr. Henrene Pastor, pending f/u 06/2015   COLONOSCOPY  09/15/2021   COLONOSCOPY WITH PROPOFOL Bilateral 09/2020   Dr.Sharryn Belding   LAPAROSCOPIC RIGHT HEMI COLECTOMY N/A 11/11/2020   Procedure: LAPAROSCOPIC RIGHT HEMI COLECTOMY;  Surgeon: Ileana Roup, MD;  Location: [facility];  Service: General;  Laterality: N/A;   [place] History Dylan Fischer  reports that he has quit smoking. His smoking use included cigars. He has never used smokeless tobacco. He reports current alcohol use of about 2.0 standard drinks of alcohol per week. He reports that he does not use drugs.  family history includes Aneurysm (age of onset: 65) in his father; Arthritis in his mother; Breast cancer (age of onset: 51) in his mother; COPD in his father; Heart disease in his mother; Hyperlipidemia in his father and mother; Hypertension in his father; Prostate cancer (age of onset: 68) in his father; Stroke in his father.  No Known Allergies     PHYSICAL EXAMINATION: Vital signs: BP (!) 86/59   Pulse 62   Temp 98.4 F (36.9 C) (Temporal)   Resp 13   Ht '6\' 4"'$  (1.93 m)   Wt 227 lb (103 kg)   SpO2 93%   BMI 27.63 kg/m  General: Well-developed, well-nourished, no acute distress HEENT:  Sclerae are anicteric, conjunctiva pink. Oral mucosa intact Lungs: Clear Heart: Regular Abdomen: soft, nontender, nondistended, no obvious ascites, no peritoneal signs, normal bowel sounds. No organomegaly. Extremities: No edema Psychiatric: alert and oriented x3. Cooperative      ASSESSMENT:  Personal history of colon cancer   PLAN:   Surveillance colonoscopy

## 2021-09-15 NOTE — Progress Notes (Signed)
Called to room to assist during endoscopic procedure.  Patient ID and intended procedure confirmed with present staff. Received instructions for my participation in the procedure from the performing physician.  

## 2021-09-15 NOTE — Op Note (Signed)
[facility] Patient Name: Dylan Fischer Procedure Date: 09/15/2021 10:31 AM MRN: [id number] Endoscopist: Docia Chuck. Henrene Pastor , MD Age: 61 Referring MD:  Date of Birth: 11/18/1960 Gender: Male Account #: [id number] Procedure:                Colonoscopy with cold snare polypectomy x 2 Indications:              High risk colon cancer surveillance: Personal                            history of colon cancer Medicines:                Monitored Anesthesia Care Procedure:                Pre-Anesthesia Assessment:                           - Prior to the procedure, a History and Physical                            was performed, and patient medications and                            allergies were reviewed. The patient's tolerance of                            previous anesthesia was also reviewed. The risks                            and benefits of the procedure and the sedation                            options and risks were discussed with the patient.                            All questions were answered, and informed consent                            was obtained. Prior Anticoagulants: The patient has                            taken no previous anticoagulant or antiplatelet                            agents. ASA Grade Assessment: II - A patient with                            mild systemic disease. After reviewing the risks                            and benefits, the patient was deemed in                            satisfactory condition to undergo the procedure.  After obtaining informed consent, the colonoscope                            was passed under direct vision. Throughout the                            procedure, the patient's blood pressure, pulse, and                            oxygen saturations were monitored continuously. The                            CF [id number] #[id number] was introduced through the anus                            and  advanced to the the colocolonic anastomosis.                            The rectum was photographed. The quality of the                            bowel preparation was excellent. The colonoscopy                            was performed without difficulty. The patient                            tolerated the procedure well. The bowel preparation                            used was SUPREP via split dose instruction. Scope In: 10:44:04 AM Scope Out: 10:57:30 AM Scope Withdrawal Time: 0 hours 10 minutes 17 seconds  Total Procedure Duration: 0 hours 13 minutes 26 seconds  Findings:                 Two polyps were found in the transverse/ascending                            colon area. The polyps were 2 to 3 mm in size.                            These polyps were removed with a cold snare.                            Resection and retrieval were complete.                           Internal hemorrhoids were found during                            retroflexion. The hemorrhoids were small.                           The exam was otherwise without abnormality on  direct and retroflexion views. Status post right                            hemicolectomy. Complications:            No immediate complications. Estimated blood loss:                            None. Estimated Blood Loss:     Estimated blood loss: none. Impression:               - Two 2 to 3 mm polyps were removed.                           - Internal hemorrhoids. Status post right                            hemicolectomy.                           - The examination was otherwise normal on direct                            and retroflexion views.                           . Recommendation:           - Repeat colonoscopy in 3 years for surveillance.                           - Patient has a contact number available for                            emergencies. The signs and symptoms of potential                             delayed complications were discussed with the                            patient. Return to normal activities tomorrow.                            Written discharge instructions were provided to the                            patient.                           - Resume previous diet.                           - Continue present medications.                           - Await pathology results. Docia Chuck. Henrene Pastor, MD 09/15/2021 11:13:09 AM This report has been signed electronically.

## 2021-09-15 NOTE — Patient Instructions (Signed)
YOU HAD AN ENDOSCOPIC PROCEDURE TODAY AT THE [facility]:   Refer to the procedure report that was given to you for any specific questions about what was found during the examination.  If the procedure report does not answer your questions, please call your gastroenterologist to clarify.  If you requested that your care partner not be given the details of your procedure findings, then the procedure report has been included in a sealed envelope for you to review at your convenience later.  YOU SHOULD EXPECT: Some feelings of bloating in the abdomen. Passage of more gas than usual.  Walking can help get rid of the air that was put into your GI tract during the procedure and reduce the bloating. If you had a lower endoscopy (such as a colonoscopy or flexible sigmoidoscopy) you may notice spotting of blood in your stool or on the toilet paper. If you underwent a bowel prep for your procedure, you may not have a normal bowel movement for a few days.  Please Note:  You might notice some irritation and congestion in your nose or some drainage.  This is from the oxygen used during your procedure.  There is no need for concern and it should clear up in a day or so.  SYMPTOMS TO REPORT IMMEDIATELY:  Following lower endoscopy (colonoscopy or flexible sigmoidoscopy):  Excessive amounts of blood in the stool  Significant tenderness or worsening of abdominal pains  Swelling of the abdomen that is new, acute  Fever of 100F or higher  For urgent or emergent issues, a gastroenterologist can be reached at any hour by calling [phone]. Do not use MyChart messaging for urgent concerns.    DIET:  We do recommend a small meal at first, but then you may proceed to your regular diet.  Drink plenty of fluids but you should avoid alcoholic beverages for 24 hours.  ACTIVITY:  You should plan to take it easy for the rest of today and you should NOT DRIVE or use heavy machinery until tomorrow (because of  the sedation medicines used during the test).    FOLLOW UP: Our staff will call the number listed on your records the next business day following your procedure.  We will call around 7:15- 8:00 am to check on you and address any questions or concerns that you may have regarding the information given to you following your procedure. If we do not reach you, we will leave a message.  If you develop any symptoms (ie: fever, flu-like symptoms, shortness of breath, cough etc.) before then, please call [phone].  If you test positive for Covid 19 in the 2 weeks post procedure, please call and report this information to us.    If any biopsies were taken you will be contacted by phone or by letter within the next 1-3 weeks.  Please call us at [phone] if you have not heard about the biopsies in 3 weeks.    SIGNATURES/CONFIDENTIALITY: You and/or your care partner have signed paperwork which will be entered into your electronic medical record.  These signatures attest to the fact that that the information above on your After Visit Summary has been reviewed and is understood.  Full responsibility of the confidentiality of this discharge information lies with you and/or your care-partner.  

## 2021-09-15 NOTE — Progress Notes (Signed)
Report to PACU, RN, vss, BBS= Clear.  

## 2021-09-15 NOTE — Progress Notes (Signed)
Pt's states no medical or surgical changes since previsit or office visit. 

## 2021-09-16 ENCOUNTER — Telehealth: Payer: Self-pay

## 2021-09-16 NOTE — Telephone Encounter (Signed)
  Follow up Call-     09/15/2021    9:40 AM 10/05/2020    1:49 PM  Call back number  Post procedure Call Back phone  # [phone] cell  Permission to leave phone message Yes Yes     Patient questions:  Do you have a fever, pain , or abdominal swelling? No. Pain Score  0 *  Have you tolerated food without any problems? Yes.    Have you been able to return to your normal activities? Yes.    Do you have any questions about your discharge instructions: Diet   No. Medications  No. Follow up visit  No.  Do you have questions or concerns about your Care? No.  Actions: * If pain score is 4 or above: No action needed, pain <4.

## 2021-09-20 ENCOUNTER — Encounter: Payer: Self-pay | Admitting: Internal Medicine

## 2021-09-21 ENCOUNTER — Ambulatory Visit: Payer: 59 | Admitting: Internal Medicine

## 2021-09-26 ENCOUNTER — Ambulatory Visit (INDEPENDENT_AMBULATORY_CARE_PROVIDER_SITE_OTHER): Payer: 59

## 2021-09-26 DIAGNOSIS — E538 Deficiency of other specified B group vitamins: Secondary | ICD-10-CM

## 2021-09-26 MED ORDER — CYANOCOBALAMIN 1000 MCG/ML IJ SOLN
1000.0000 ug | Freq: Once | INTRAMUSCULAR | Status: AC
  Administered 2021-09-26: 1000 ug via INTRAMUSCULAR

## 2021-09-26 NOTE — Progress Notes (Signed)
After obtaining consent, and per orders of Dr. Sharlet Salina, injection of B12 was given on the left deltoid  by Marrian Salvage. Patient tolerated well and instructed to report any adverse reaction to me immediately.

## 2021-10-03 ENCOUNTER — Other Ambulatory Visit: Payer: Self-pay

## 2021-10-10 ENCOUNTER — Ambulatory Visit (INDEPENDENT_AMBULATORY_CARE_PROVIDER_SITE_OTHER): Payer: 59

## 2021-10-10 DIAGNOSIS — E538 Deficiency of other specified B group vitamins: Secondary | ICD-10-CM

## 2021-10-10 MED ORDER — CYANOCOBALAMIN 1000 MCG/ML IJ SOLN
1000.0000 ug | Freq: Once | INTRAMUSCULAR | Status: AC
  Administered 2021-10-10: 1000 ug via INTRAMUSCULAR

## 2021-10-10 NOTE — Progress Notes (Signed)
After obtaining consent, and per orders of Dr. Sharlet Salina, injection of B12 given by Max Sane. Patient tolerated injection well in right deltoid and instructed to report any adverse reaction to me immediately.

## 2021-10-11 ENCOUNTER — Other Ambulatory Visit: Payer: Self-pay

## 2021-10-11 ENCOUNTER — Ambulatory Visit
Admission: RE | Admit: 2021-10-11 | Discharge: 2021-10-11 | Disposition: A | Discharge status: Home / Self Care | Payer: 59 | Source: Ambulatory Visit | Attending: Oncology | Admitting: Oncology

## 2021-10-11 DIAGNOSIS — C184 Malignant neoplasm of transverse colon: Secondary | ICD-10-CM

## 2021-10-11 MED ORDER — IOPAMIDOL (ISOVUE-300) INJECTION 61%
100.0000 mL | Freq: Once | INTRAVENOUS | Status: AC | PRN
  Administered 2021-10-11: 100 mL via INTRAVENOUS

## 2021-11-07 ENCOUNTER — Inpatient Hospital Stay: Payer: 59 | Attending: Oncology

## 2021-11-07 DIAGNOSIS — Z79899 Other long term (current) drug therapy: Secondary | ICD-10-CM | POA: Insufficient documentation

## 2021-11-07 DIAGNOSIS — C189 Malignant neoplasm of colon, unspecified: Secondary | ICD-10-CM | POA: Insufficient documentation

## 2021-11-07 DIAGNOSIS — G620 Drug-induced polyneuropathy: Secondary | ICD-10-CM | POA: Insufficient documentation

## 2021-11-07 DIAGNOSIS — I10 Essential (primary) hypertension: Secondary | ICD-10-CM | POA: Diagnosis not present

## 2021-11-07 DIAGNOSIS — C184 Malignant neoplasm of transverse colon: Secondary | ICD-10-CM

## 2021-11-07 LAB — BASIC METABOLIC PANEL - CANCER CENTER ONLY
Anion gap: 8 (ref 5–15)
BUN: 15 mg/dL (ref 8–23)
CO2: 30 mmol/L (ref 22–32)
Calcium: 9.5 mg/dL (ref 8.9–10.3)
Chloride: 101 mmol/L (ref 98–111)
Creatinine: 0.98 mg/dL (ref 0.61–1.24)
GFR, Estimated: >60 mL/min (ref >60)
Glucose, Bld: 123 mg/dL — ABNORMAL HIGH (ref 70–99)
Potassium: 4.1 mmol/L (ref 3.5–5.1)
Sodium: 139 mmol/L (ref 135–145)

## 2021-11-07 LAB — CEA (ACCESS): CEA (CHCC): 1.42 ng/mL (ref 0.00–5.00)

## 2021-11-09 ENCOUNTER — Encounter: Payer: Self-pay | Admitting: Nurse Practitioner

## 2021-11-09 ENCOUNTER — Inpatient Hospital Stay (HOSPITAL_BASED_OUTPATIENT_CLINIC_OR_DEPARTMENT_OTHER): Payer: 59 | Admitting: Nurse Practitioner

## 2021-11-09 VITALS — BP 121/74 | HR 60 | Temp 98.2°F | Resp 18 | Ht 76.0 in | Wt 228.4 lb

## 2021-11-09 DIAGNOSIS — C189 Malignant neoplasm of colon, unspecified: Secondary | ICD-10-CM | POA: Diagnosis not present

## 2021-11-09 DIAGNOSIS — C184 Malignant neoplasm of transverse colon: Secondary | ICD-10-CM

## 2021-11-09 MED ORDER — GABAPENTIN 300 MG PO CAPS: 300.0000 mg | ORAL_CAPSULE | Freq: Every day | ORAL | 1 refills | Status: DC

## 2021-11-09 NOTE — Progress Notes (Signed)
  [place] OFFICE PROGRESS NOTE   Diagnosis: Colon cancer  INTERVAL HISTORY:   Dylan Fischer returns as scheduled.  No change in bowel habits.  No bleeding or pain with bowel movements.  No abdominal pain.  No nausea or vomiting.  He has a good appetite.  He has numbness/tingling on the soles of his feet.  This is periodically painful.  No significant numbness or tingling in the hands.  Objective:  Vital signs in last 24 hours:  Blood pressure 121/74, pulse 60, temperature 98.2 F (36.8 C), temperature source Oral, resp. rate 18, height $RemoveBe'6\' 4"'onrsYXlyJ$  (1.93 m), weight 228 lb 6.4 oz (103.6 kg), SpO2 100 %.    Lymphatics: No palpable cervical, supraclavicular, axillary or inguinal lymph nodes. Resp: Lungs clear bilaterally. Cardio: Regular rate and rhythm. GI: Abdomen soft and nontender.  No hepatomegaly. Vascular: No leg edema. Skin: Soles mildly dry.   Lab Results:  Lab Results  Component Value Date   WBC 4.8 04/07/2021   HGB 13.8 04/07/2021   HCT 40.8 04/07/2021   MCV 87.4 04/07/2021   PLT 149 (L) 04/07/2021   NEUTROABS 3.1 04/07/2021    Imaging:  No results found.  Medications: I have reviewed the patient's current medications.  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, MSI-high, loss of MLH1 and PMS2 expression, negative for BRAF V600E mutation, MLH1 hypermethylation absent  Negative CancerNext panel-VUS in the [facility] gene colonoscopy 10/05/2020-near completely obstructing mass at the hepatic flexure-could not be passed, polyps in the sigmoid and transverse colon CTs 10/15/2020-short segment wall thickening with adjacent inflammatory stranding at the hepatic flexure, prominent mesenteric nodes adjacent to the colonic neoplasm measuring up to 5 mm,  prominent gastropathic, hepatoduodenal ligament, and right sided retroperitoneal nodes measured up to 7 mm, subcentimeter hypodense hypodensities-too small to characterize, tiny right middle lobe nodule 12/13/2020-ctDNA not detected Cycle 1 CAPOX 12/13/2020, Xeloda discontinued around 12/24/2020 Cycle 1 FOLFOX 01/24/2021 Cycle 2 FOLFOX 02/07/2021 Guardant reveal 02/16/2021-detected Cycle 3 FOLFOX 02/21/2021 Cycle 4 FOLFOX 03/08/2021, 5-FU dose escalated, home Decadron prophylaxis added Cycle 5 FOLFOX 03/22/2021 Guardant reveal 04/07/2021-ct DNA not detected Guardant reveal 07/13/2021-negative for circulating tumor DNA CTs 10/11/2021-no evidence of metastatic disease   Tubular adenoma on the colonoscopy 10/05/2020 and on colonoscopy 06/14/2010 [facility] 12/30/2020-acute gastroenteritis/colitis Negative for the IVS14+ DPYD mutation 6.  Oxaliplatin neuropathy    Disposition: [name] appears stable.  He remains in clinical remission from colon cancer.  Recent surveillance CT scans with no evidence of metastatic disease.  CEA is stable in normal range.  He has neuropathy, at times painful.  He will begin gabapentin 300 mg at bedtime.  We discussed the potential for sedation.  He will return for a CEA and follow-up visit in 6 months.    Ned Card ANP/GNP-BC   11/09/2021  9:37 AM

## 2022-05-10 ENCOUNTER — Inpatient Hospital Stay: Payer: 59 | Attending: Oncology

## 2022-05-10 ENCOUNTER — Inpatient Hospital Stay (HOSPITAL_BASED_OUTPATIENT_CLINIC_OR_DEPARTMENT_OTHER): Payer: 59 | Admitting: Oncology

## 2022-05-10 VITALS — BP 105/78 | HR 64 | Temp 98.1°F | Resp 18 | Ht 76.0 in | Wt 238.2 lb

## 2022-05-10 DIAGNOSIS — Z9221 Personal history of antineoplastic chemotherapy: Secondary | ICD-10-CM | POA: Insufficient documentation

## 2022-05-10 DIAGNOSIS — C189 Malignant neoplasm of colon, unspecified: Secondary | ICD-10-CM | POA: Diagnosis not present

## 2022-05-10 DIAGNOSIS — Z79899 Other long term (current) drug therapy: Secondary | ICD-10-CM | POA: Insufficient documentation

## 2022-05-10 DIAGNOSIS — G620 Drug-induced polyneuropathy: Secondary | ICD-10-CM | POA: Diagnosis not present

## 2022-05-10 DIAGNOSIS — I10 Essential (primary) hypertension: Secondary | ICD-10-CM | POA: Insufficient documentation

## 2022-05-10 DIAGNOSIS — C184 Malignant neoplasm of transverse colon: Secondary | ICD-10-CM | POA: Diagnosis not present

## 2022-05-10 LAB — CEA (ACCESS): CEA (CHCC): 1.28 ng/mL (ref 0.00–5.00)

## 2022-05-10 NOTE — Progress Notes (Signed)
  [place] OFFICE PROGRESS NOTE   Diagnosis: Colon cancer  INTERVAL HISTORY:   Dylan Fischer returns as scheduled.  He feels well.  Good appetite.  No difficulty with bowel function.  No bleeding.  He continues to have neuropathy symptoms in the feet.  He has numbness and discomfort.  Balance has improved.  Objective:  Vital signs in last 24 hours:  Blood pressure 105/78, pulse 64, temperature 98.1 F (36.7 C), temperature source Oral, resp. rate 18, height 6' 4"$  (1.93 m), weight 238 lb 3.2 oz (108 kg), SpO2 98 %.    HEENT: Neck without mass Lymphatics: No cervical, supraclavicular, axillary, or inguinal nodes Resp: Lungs clear bilaterally Cardio: Rate and rhythm GI: No hepatosplenomegaly, no mass, nontender Vascular: No leg edema   Lab Results:  Lab Results  Component Value Date   WBC 4.8 04/07/2021   HGB 13.8 04/07/2021   HCT 40.8 04/07/2021   MCV 87.4 04/07/2021   PLT 149 (L) 04/07/2021   NEUTROABS 3.1 04/07/2021    CMP  Lab Results  Component Value Date   NA 139 11/07/2021   K 4.1 11/07/2021   CL 101 11/07/2021   CO2 30 11/07/2021   GLUCOSE 123 (H) 11/07/2021   BUN 15 11/07/2021   CREATININE 0.98 11/07/2021   CALCIUM 9.5 11/07/2021   PROT 6.8 04/07/2021   ALBUMIN 4.3 04/07/2021   AST 36 04/07/2021   ALT 38 04/07/2021   ALKPHOS 56 04/07/2021   BILITOT 0.8 04/07/2021   GFRNONAA >60 11/07/2021   GFRAA 101 06/26/2019    Lab Results  Component Value Date   CEA 1.28 05/10/2022   Medications: I have reviewed the patient's current medications.   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, MSI-high, loss of MLH1 and PMS2 expression, negative for BRAF V600E mutation, MLH1 hypermethylation absent  Negative CancerNext panel-VUS  in the [facility] gene colonoscopy 10/05/2020-near completely obstructing mass at the hepatic flexure-could not be passed, polyps in the sigmoid and transverse colon CTs 10/15/2020-short segment wall thickening with adjacent inflammatory stranding at the hepatic flexure, prominent mesenteric nodes adjacent to the colonic neoplasm measuring up to 5 mm, prominent gastropathic, hepatoduodenal ligament, and right sided retroperitoneal nodes measured up to 7 mm, subcentimeter hypodense hypodensities-too small to characterize, tiny right middle lobe nodule 12/13/2020-ctDNA not detected Cycle 1 CAPOX 12/13/2020, Xeloda discontinued around 12/24/2020 Cycle 1 FOLFOX 01/24/2021 Cycle 2 FOLFOX 02/07/2021 Guardant reveal 02/16/2021-detected Cycle 3 FOLFOX 02/21/2021 Cycle 4 FOLFOX 03/08/2021, 5-FU dose escalated, home Decadron prophylaxis added Cycle 5 FOLFOX 03/22/2021 Guardant reveal 04/07/2021-ct DNA not detected Guardant reveal 07/13/2021-negative for circulating tumor DNA CTs 10/11/2021-no evidence of metastatic disease   Tubular adenoma on the colonoscopy 10/05/2020 and on colonoscopy 06/14/2010 Colonoscopy 09/16/2018 23-2 polyps removed from the transverse/ascending colon-tubular adenomas Hypertension [facility] admission 12/30/2020-acute gastroenteritis/colitis Negative for the IVS14+ DPYD mutation 6.  Oxaliplatin neuropathy      Disposition: Dylan Fischer is in clinical remission from colon cancer.  He will return for an office visit and surveillance imaging in 6 months.  He will continue colonoscopy surveillance with Dr. Henrene Pastor.  Dylan Coder, MD  05/10/2022  10:29 AM

## 2022-05-10 NOTE — Progress Notes (Signed)
Dylan Fischer has decided he does not wish to pursue any further Guardant Reveal testing at this time.

## 2022-08-09 IMAGING — CT CT ABD-PELV W/ CM
2 of 5 series · 15 of 46 positions shown, 17 images · IV contrast (omnipaque)
Comparison: CT abdomen/pelvis 10/15/2020

CLINICAL DATA: Acute abdominal pain. History of colon cancer,
started chemotherapy 2 weeks ago

EXAM:
CT ABDOMEN AND PELVIS WITH CONTRAST
TECHNIQUE: Multidetector CT imaging of the abdomen and pelvis was performed
using the standard protocol following bolus administration of
intravenous contrast.
CONTRAST:  80mL OMNIPAQUE IOHEXOL 350 MG/ML SOLN

[Series 2: axial st · axial · 0.98mm/px · z∈[+1018,+1498]mm · 12 of 114 slices shown, 14 images]
[im 9/114  soft-tissue]
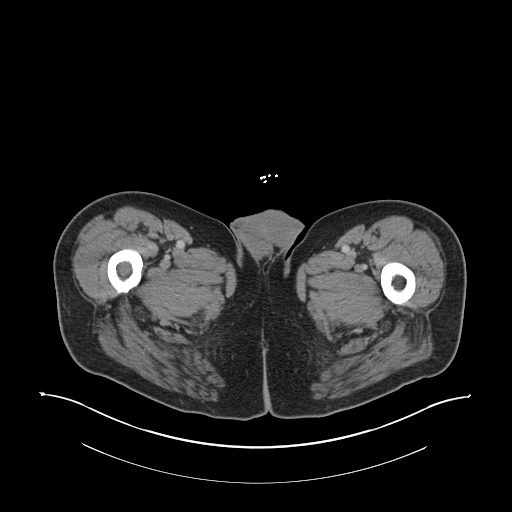
[im 9/114  bone]
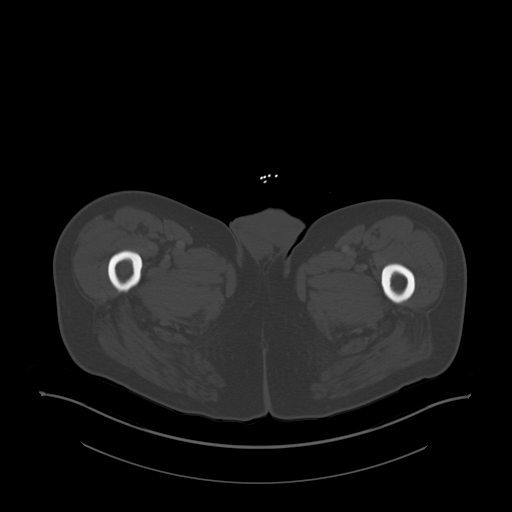
[im 17/114  soft-tissue]
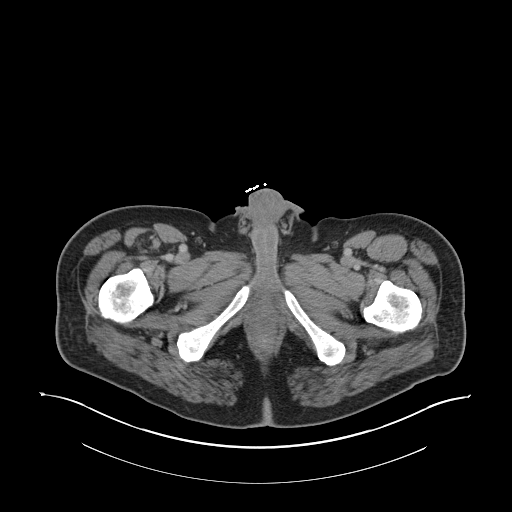
[im 25/114  soft-tissue]
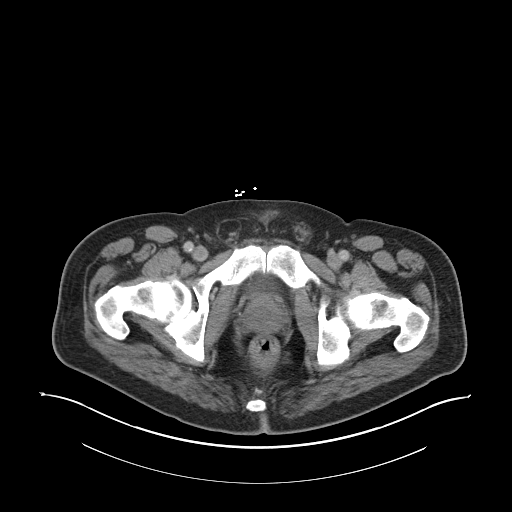
[im 33/114  soft-tissue]
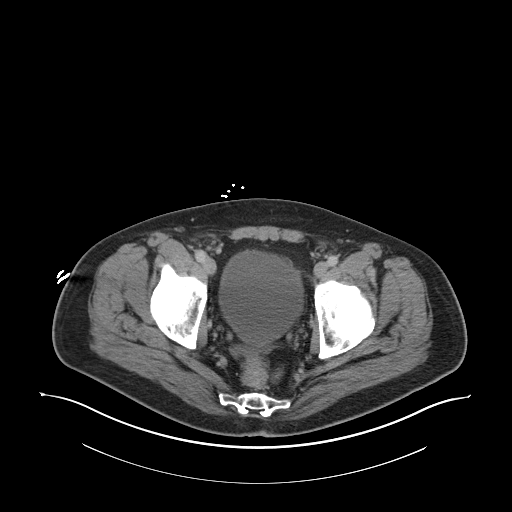
[im 41/114  soft-tissue]
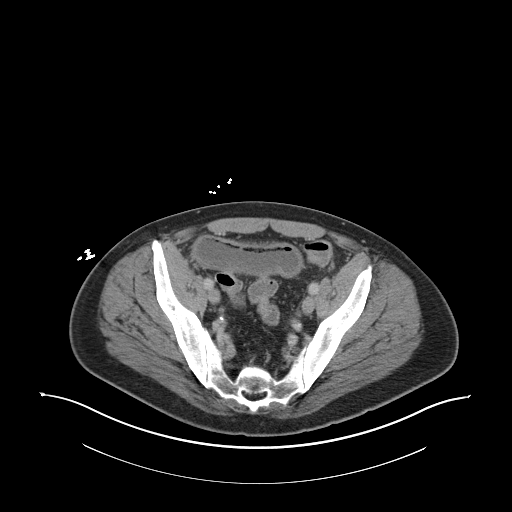
[im 49/114  soft-tissue]
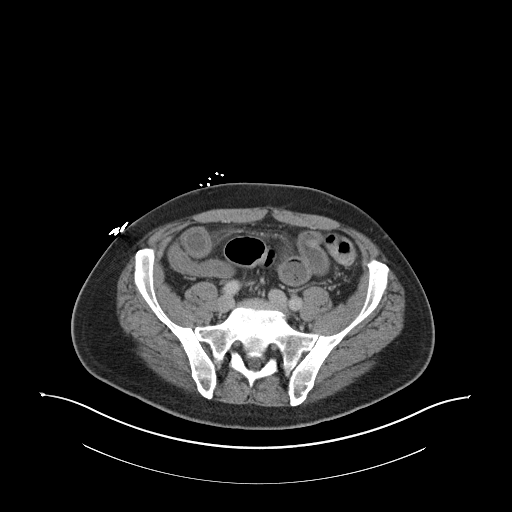
[im 65/114  soft-tissue]
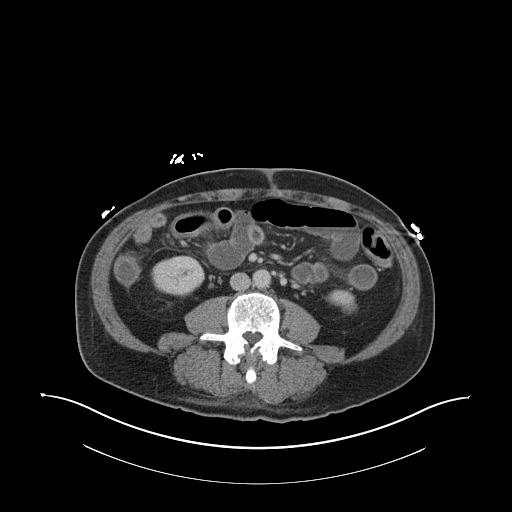
[im 73/114  soft-tissue]
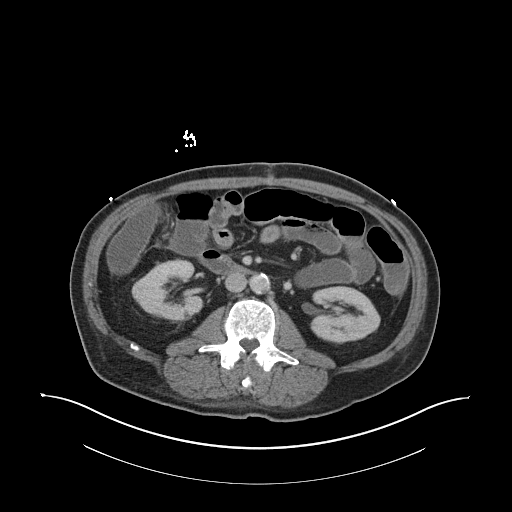
[im 81/114  soft-tissue]
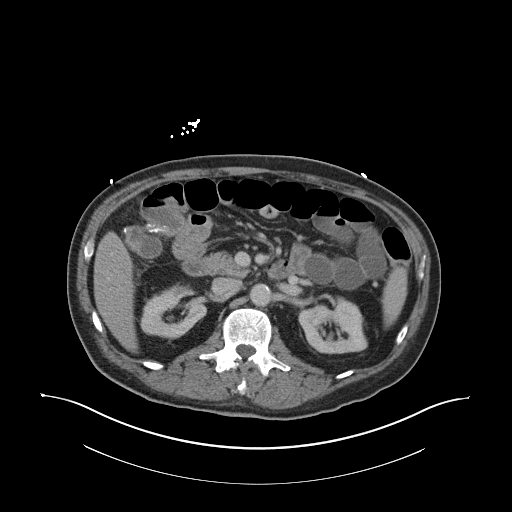
[im 81/114  bone]
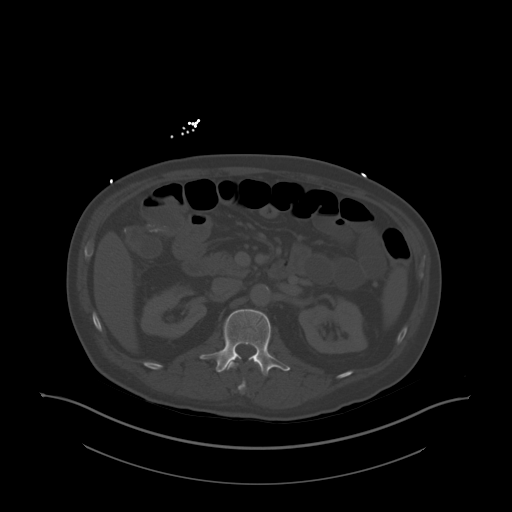
[im 89/114  soft-tissue]
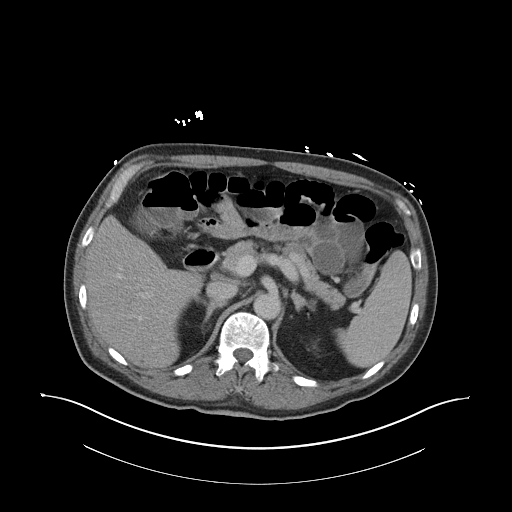
[im 97/114  soft-tissue]
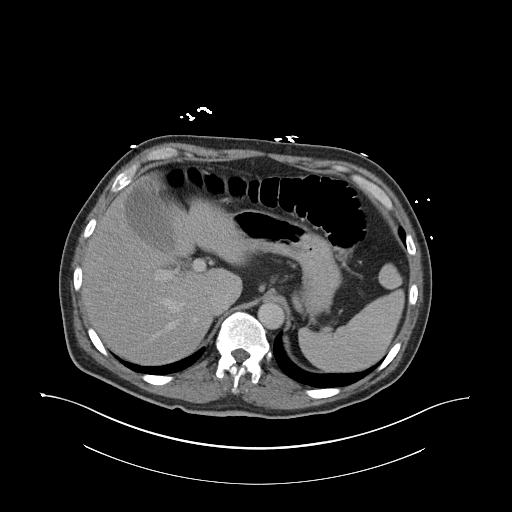
[im 105/114  soft-tissue]
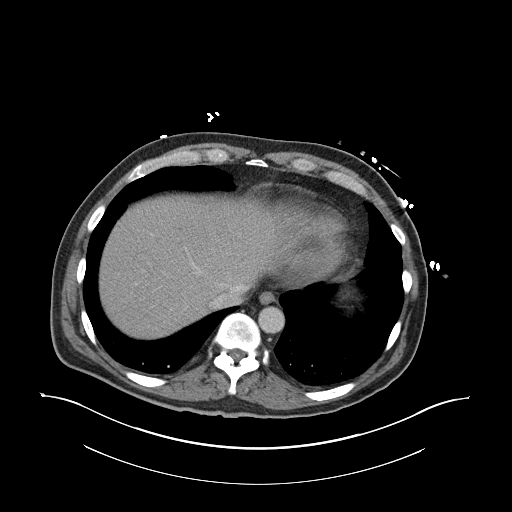

[Series 5: coronal st · coronal · 0.95mm/px · 3 of 160 slices shown]
[im 54/160  soft-tissue]
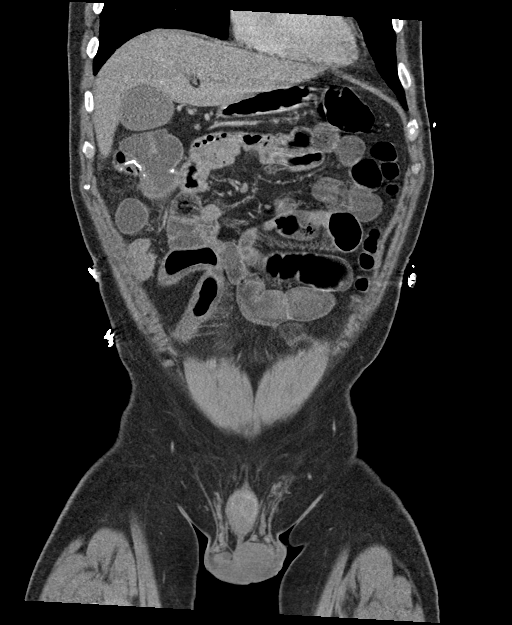
[im 71/160  soft-tissue]
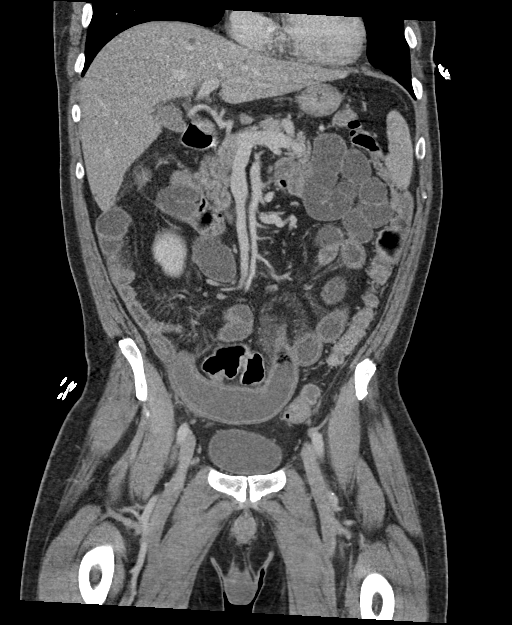
[im 89/160  soft-tissue]
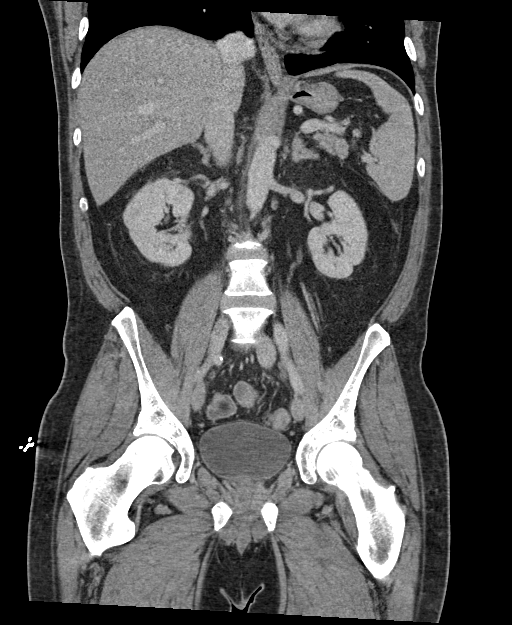

[15 of 46 positions shown; findings below may reference images not displayed]

FINDINGS: Lower chest: There is minimal bibasilar subsegmental atelectasis.
The imaged heart is unremarkable.

Hepatobiliary: A subcentimeter hypodense lesion in the right hepatic
lobe near the dome is unchanged, too small to characterize. The
liver is otherwise unremarkable. The gallbladder is unremarkable.
There is no biliary ductal dilatation.

Pancreas: Unremarkable.

Spleen: Unremarkable.

Adrenals/Urinary Tract: The adrenals are unremarkable.

Subcentimeter hypodense lesions in the kidneys are unchanged, again
too small to characterize but likely reflect small cysts. The
kidneys are otherwise unremarkable, with no other focal lesions.
There are no stones. There is no hydronephrosis or hydroureter.

Stomach/Bowel: The stomach is unremarkable.

There has been interval partial colectomy for colonic malignancy
resection. There is no evidence of complication at the anastomotic
site.

There is marked wall thickening involving the distal small bowel
with associated mucosal hyperemia and surrounding fat stranding
consistent with infectious or inflammatory enteritis. There is no
pneumatosis intestinalis. There is no evidence of bowel obstruction.

Vascular/Lymphatic: There is scattered calcified atherosclerotic
plaque throughout the nonaneurysmal abdominal aorta. The major
branch vessels are patent. The main portal and splenic veins are
patent. There is no portal venous gas.

Scattered subcentimeter periportal lymph nodes measuring up to 6-7
mm are unchanged. There is no new or progressive lymphadenopathy in
the abdomen or pelvis.

Reproductive: The prostate and seminal vesicles are unremarkable.

Other: There is small volume free fluid in the pelvis, likely
reactive. There is no free intraperitoneal air.

Musculoskeletal: There is no acute osseous abnormality or aggressive
osseous lesion.
IMPRESSION: 1. Marked wall thickening and mucosal hyperemia in the distal small
bowel consistent with nonspecific infectious/inflammatory enteritis.
Differential would include medication side effect given recent
initiation of chemotherapy.
2. Interval partial colectomy for colonic malignancy resection
without evidence of complication at the anastomotic site.
3. Scattered subcentimeter upper abdominal lymph nodes are
unchanged. No new or progressive lymphadenopathy in the abdomen or
pelvis.

Aortic Atherosclerosis ([id number].T).

## 2022-08-10 ENCOUNTER — Other Ambulatory Visit: Payer: Self-pay | Admitting: Internal Medicine

## 2022-08-10 DIAGNOSIS — E785 Hyperlipidemia, unspecified: Secondary | ICD-10-CM

## 2022-08-14 ENCOUNTER — Encounter: Payer: Self-pay | Admitting: Internal Medicine

## 2022-08-21 ENCOUNTER — Encounter: Payer: Self-pay | Admitting: Internal Medicine

## 2022-08-21 ENCOUNTER — Ambulatory Visit (INDEPENDENT_AMBULATORY_CARE_PROVIDER_SITE_OTHER): Payer: 59 | Admitting: Internal Medicine

## 2022-08-21 VITALS — BP 120/80 | HR 55 | Temp 98.2°F | Ht 76.0 in | Wt 234.0 lb

## 2022-08-21 DIAGNOSIS — E538 Deficiency of other specified B group vitamins: Secondary | ICD-10-CM

## 2022-08-21 DIAGNOSIS — G620 Drug-induced polyneuropathy: Secondary | ICD-10-CM

## 2022-08-21 DIAGNOSIS — H10829 Rosacea conjunctivitis, unspecified eye: Secondary | ICD-10-CM | POA: Diagnosis not present

## 2022-08-21 DIAGNOSIS — E785 Hyperlipidemia, unspecified: Secondary | ICD-10-CM | POA: Diagnosis not present

## 2022-08-21 DIAGNOSIS — I700 Atherosclerosis of aorta: Secondary | ICD-10-CM

## 2022-08-21 DIAGNOSIS — I10 Essential (primary) hypertension: Secondary | ICD-10-CM | POA: Diagnosis not present

## 2022-08-21 DIAGNOSIS — C184 Malignant neoplasm of transverse colon: Secondary | ICD-10-CM | POA: Diagnosis not present

## 2022-08-21 DIAGNOSIS — Z0000 Encounter for general adult medical examination without abnormal findings: Secondary | ICD-10-CM

## 2022-08-21 LAB — COMPREHENSIVE METABOLIC PANEL
ALT: 107 U/L — ABNORMAL HIGH (ref 0–53)
AST: 85 U/L — ABNORMAL HIGH (ref 0–37)
Albumin: 4.5 g/dL (ref 3.5–5.2)
Alkaline Phosphatase: 47 U/L (ref 39–117)
BUN: 12 mg/dL (ref 6–23)
CO2: 28 mEq/L (ref 19–32)
Calcium: 9.2 mg/dL (ref 8.4–10.5)
Chloride: 102 mEq/L (ref 96–112)
Creatinine, Ser: 0.91 mg/dL (ref 0.40–1.50)
GFR: 89.62 mL/min (ref >60.00)
Glucose, Bld: 110 mg/dL — ABNORMAL HIGH (ref 70–99)
Potassium: 3.6 mEq/L (ref 3.5–5.1)
Sodium: 139 mEq/L (ref 135–145)
Total Bilirubin: 1.2 mg/dL (ref 0.2–1.2)
Total Protein: 6.9 g/dL (ref 6.0–8.3)

## 2022-08-21 LAB — LIPID PANEL
Cholesterol: 124 mg/dL (ref 0–200)
HDL: 33.7 mg/dL — ABNORMAL LOW (ref >39.00)
NonHDL: 90.11
Total CHOL/HDL Ratio: 4
Triglycerides: 210 mg/dL — ABNORMAL HIGH (ref 0.0–149.0)
VLDL: 42 mg/dL — ABNORMAL HIGH (ref 0.0–40.0)

## 2022-08-21 LAB — CBC
HCT: 46.5 % (ref 39.0–52.0)
Hemoglobin: 15.8 g/dL (ref 13.0–17.0)
MCHC: 34 g/dL (ref 30.0–36.0)
MCV: 90.3 fl (ref 78.0–100.0)
Platelets: 177 10*3/uL (ref 150.0–400.0)
RBC: 5.15 Mil/uL (ref 4.22–5.81)
RDW: 13.6 % (ref 11.5–15.5)
WBC: 5.2 10*3/uL (ref 4.0–10.5)

## 2022-08-21 LAB — LDL CHOLESTEROL, DIRECT: Direct LDL: 73 mg/dL

## 2022-08-21 LAB — VITAMIN B12: Vitamin B-12: 1137 pg/mL — ABNORMAL HIGH (ref 211–911)

## 2022-08-21 LAB — HEMOGLOBIN A1C: Hgb A1c MFr Bld: 6 % (ref 4.6–6.5)

## 2022-08-21 MED ORDER — OMEGA-3-ACID ETHYL ESTERS 1 G PO CAPS: 2.0000 g | ORAL_CAPSULE | Freq: Two times a day (BID) | ORAL | 3 refills | Status: DC

## 2022-08-21 MED ORDER — OLMESARTAN MEDOXOMIL-HCTZ 20-12.5 MG PO TABS: ORAL_TABLET | ORAL | 3 refills | Status: DC

## 2022-08-21 MED ORDER — ROSUVASTATIN CALCIUM 40 MG PO TABS: 40.0000 mg | ORAL_TABLET | Freq: Every day | ORAL | 3 refills | Status: DC

## 2022-08-21 MED ORDER — DOXYCYCLINE HYCLATE 100 MG PO TABS: 100.0000 mg | ORAL_TABLET | Freq: Two times a day (BID) | ORAL | 2 refills | Status: DC

## 2022-08-21 MED ORDER — GABAPENTIN 300 MG PO CAPS: 300.0000 mg | ORAL_CAPSULE | Freq: Every day | ORAL | 3 refills | Status: DC

## 2022-08-21 NOTE — Progress Notes (Unsigned)
   Subjective:   Patient ID: Dylan Fischer, male    DOB: 24-May-1960, 62 y.o.   MRN: [id number]  HPI The patient is here for physical.  PMH, [facility], social history reviewed and updated  Review of Systems  Constitutional: Negative.   HENT: Negative.    Eyes: Negative.   Respiratory:  Negative for cough, chest tightness and shortness of breath.   Cardiovascular:  Negative for chest pain, palpitations and leg swelling.  Gastrointestinal:  Negative for abdominal distention, abdominal pain, constipation, diarrhea, nausea and vomiting.  Musculoskeletal: Negative.   Skin: Negative.   Neurological:  Positive for numbness.  Psychiatric/Behavioral: Negative.      Objective:  Physical Exam Constitutional:      Appearance: He is well-developed.  HENT:     Head: Normocephalic and atraumatic.  Cardiovascular:     Rate and Rhythm: Normal rate and regular rhythm.  Pulmonary:     Effort: Pulmonary effort is normal. No respiratory distress.     Breath sounds: Normal breath sounds. No wheezing or rales.  Abdominal:     General: Bowel sounds are normal. There is no distension.     Palpations: Abdomen is soft.     Tenderness: There is no abdominal tenderness. There is no rebound.  Musculoskeletal:     Cervical back: Normal range of motion.  Skin:    General: Skin is warm and dry.  Neurological:     Mental Status: He is alert and oriented to person, place, and time.     Coordination: Coordination normal.     Vitals:   08/21/22 1418  BP: 120/80  Pulse: (!) 55  Temp: 98.2 F (36.8 C)  TempSrc: Oral  SpO2: 98%  Weight: 234 lb (106.1 kg)  Height: 6\' 4"  (1.93 m)    Assessment & Plan:

## 2022-08-24 ENCOUNTER — Encounter: Payer: Self-pay | Admitting: Internal Medicine

## 2022-08-24 NOTE — Assessment & Plan Note (Signed)
Flu shot yearly. Shingrix complete. Tetanus up to date. Colonoscopy up to date. Counseled about sun safety and mole surveillance. Counseled about the dangers of distracted driving. Given 10 year screening recommendations.   

## 2022-08-24 NOTE — Assessment & Plan Note (Signed)
Taking crestor 40 mg daily and checking lipid panel and adjust as needed.

## 2022-08-24 NOTE — Assessment & Plan Note (Signed)
Checking B12 and taking oral daily. Adjust as needed.

## 2022-08-24 NOTE — Assessment & Plan Note (Signed)
Checking CMP and CBC and lipid panel. BP at goal on olmesartan/hctz 20/12.5 mg daily. Adjust as needed.

## 2022-08-24 NOTE — Assessment & Plan Note (Signed)
Refilled gabapentin 300 mg qhs and this is stable for now.

## 2022-08-24 NOTE — Assessment & Plan Note (Signed)
Checking lipid panel and adjust as needed crestor 40 mg daily and lovaza 1 g BID.

## 2022-08-24 NOTE — Assessment & Plan Note (Signed)
Refilled gabapentin which he uses for stable neuropathy related to chemotherapy.

## 2022-09-03 IMAGING — XA IR PICC >5YO
1 series · 1 of 1 positions shown · IV contrast (agent unspecified)
Comparison: none

INDICATION: Patient in need of intravenous access for chemotherapy purposes.
Request is for PICC line placement

EXAM:
ULTRASOUND AND FLUOROSCOPIC GUIDED PICC LINE INSERTION
MEDICATIONS:
Lidocaine 1% 2 mL
CONTRAST:  None
FLUOROSCOPY TIME:  24 seconds (2 mGy)
COMPLICATIONS:
None immediate.
TECHNIQUE: The procedure, risks, benefits, and alternatives were explained to
the patient and informed written consent was obtained.

[Series 1: ir fluoro/shunt/fist · 1 of 1 slices shown]
[im 1/1]
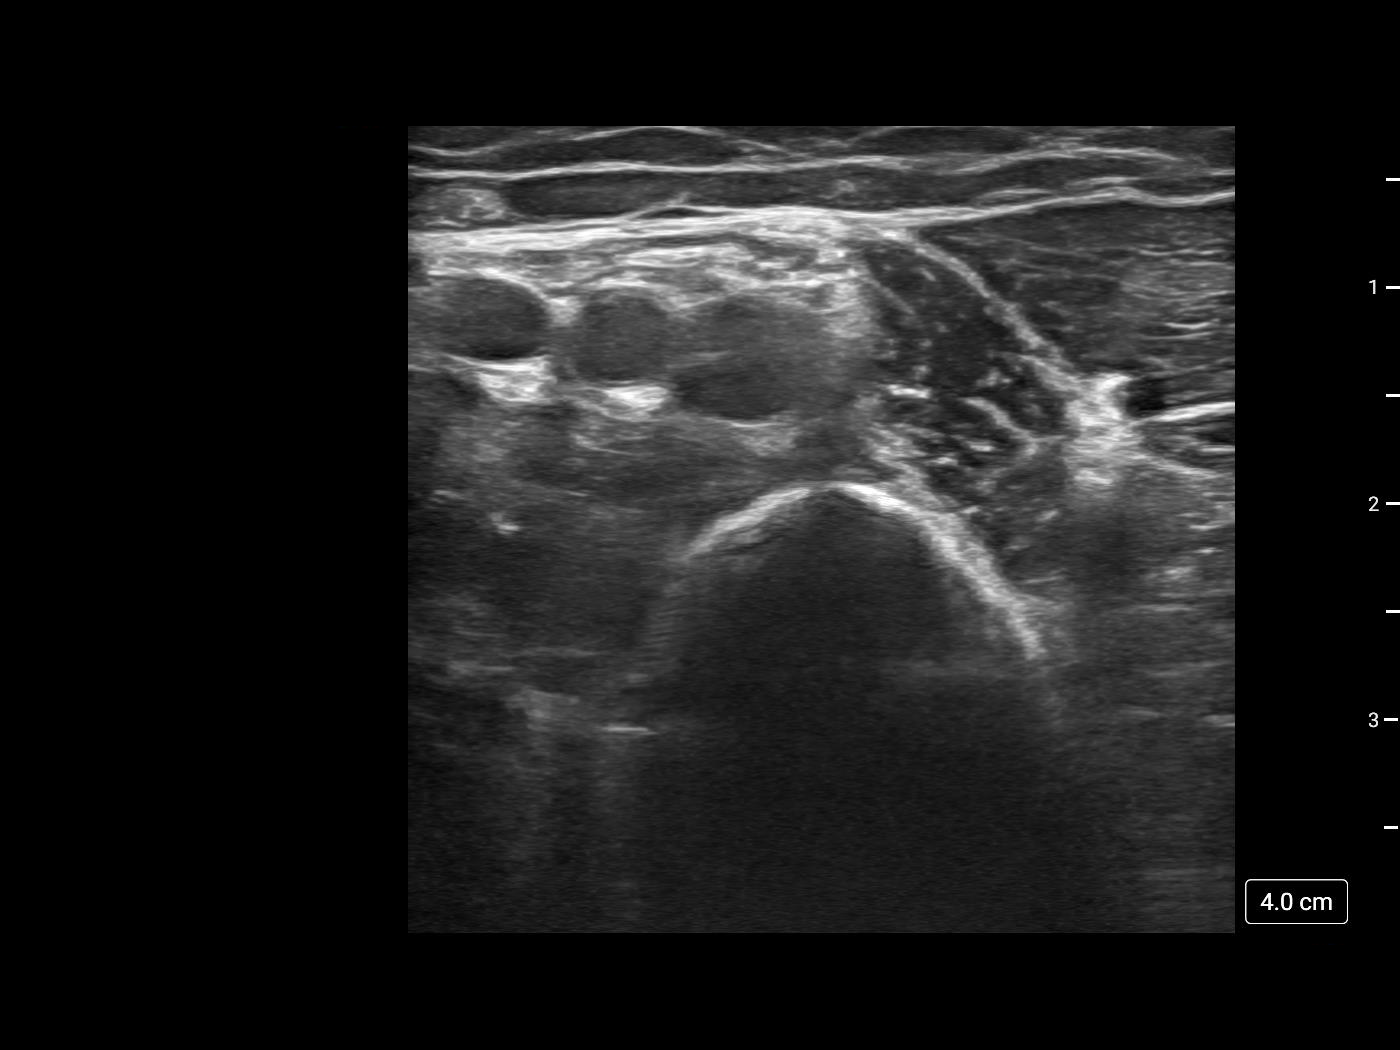

[1 of 1 positions shown; findings below may reference images not displayed]

The right upper extremity was prepped with chlorhexidine in a
sterile fashion, and a sterile drape was applied covering the
operative field. Maximum barrier sterile technique with sterile
gowns and gloves were used for the procedure. A timeout was
performed prior to the initiation of the procedure. Local anesthesia
was provided with 1% lidocaine.

After the overlying soft tissues were anesthetized and a
micropuncture kit was utilized to access the right basilic vein.
Real-time ultrasound guidance was utilized for vascular access
including the acquisition of a permanent ultrasound image
documenting patency of the accessed vessel.

A guidewire was advanced to the level of the superior caval-atrial
junction for measurement purposes and the PICC line was cut to
length. A peel-away sheath was placed and a 39 cm, 5 French, dual
lumen was inserted to level of the superior caval-atrial junction. A
post procedure spot fluoroscopic was obtained. The catheter easily
aspirated and flushed and was secured in place. A dressing was
placed. The patient tolerated the procedure well without immediate
post procedural complication.
FINDINGS: After catheter placement, the tip lies within the superior
cavoatrial junction. The catheter aspirates and flushes normally and
is ready for immediate use.
IMPRESSION: Successful ultrasound and fluoroscopic guided placement of a right
basilic vein approach, 39 cm, 5 French, dual lumen PICC with tip at
the superior caval-atrial junction. The PICC line is ready for
immediate use.

Read by: Misael Seibert, NP

## 2022-09-25 ENCOUNTER — Encounter: Payer: Self-pay | Admitting: Oncology

## 2022-10-02 ENCOUNTER — Encounter: Payer: Self-pay | Admitting: Oncology

## 2022-10-05 ENCOUNTER — Other Ambulatory Visit: Payer: 59

## 2022-10-11 ENCOUNTER — Ambulatory Visit
Admission: RE | Admit: 2022-10-11 | Discharge: 2022-10-11 | Disposition: A | Discharge status: Home / Self Care | Payer: 59 | Source: Ambulatory Visit | Attending: Oncology | Admitting: Oncology

## 2022-10-11 DIAGNOSIS — C184 Malignant neoplasm of transverse colon: Secondary | ICD-10-CM

## 2022-10-11 MED ORDER — IOPAMIDOL (ISOVUE-300) INJECTION 61%
100.0000 mL | Freq: Once | INTRAVENOUS | Status: AC | PRN
  Administered 2022-10-11: 100 mL via INTRAVENOUS

## 2022-10-13 ENCOUNTER — Inpatient Hospital Stay: Payer: 59 | Attending: Oncology

## 2022-10-13 DIAGNOSIS — T451X5A Adverse effect of antineoplastic and immunosuppressive drugs, initial encounter: Secondary | ICD-10-CM | POA: Insufficient documentation

## 2022-10-13 DIAGNOSIS — C184 Malignant neoplasm of transverse colon: Secondary | ICD-10-CM | POA: Diagnosis present

## 2022-10-13 DIAGNOSIS — R7989 Other specified abnormal findings of blood chemistry: Secondary | ICD-10-CM | POA: Diagnosis not present

## 2022-10-13 DIAGNOSIS — G620 Drug-induced polyneuropathy: Secondary | ICD-10-CM | POA: Diagnosis not present

## 2022-10-13 DIAGNOSIS — L718 Other rosacea: Secondary | ICD-10-CM | POA: Insufficient documentation

## 2022-10-13 DIAGNOSIS — Z9049 Acquired absence of other specified parts of digestive tract: Secondary | ICD-10-CM | POA: Diagnosis not present

## 2022-10-13 DIAGNOSIS — I10 Essential (primary) hypertension: Secondary | ICD-10-CM | POA: Diagnosis not present

## 2022-10-13 LAB — BASIC METABOLIC PANEL - CANCER CENTER ONLY
Anion gap: 9 (ref 5–15)
BUN: 15 mg/dL (ref 8–23)
CO2: 29 mmol/L (ref 22–32)
Calcium: 9.7 mg/dL (ref 8.9–10.3)
Chloride: 101 mmol/L (ref 98–111)
Creatinine: 0.96 mg/dL (ref 0.61–1.24)
GFR, Estimated: >60 mL/min (ref >60)
Glucose, Bld: 179 mg/dL — ABNORMAL HIGH (ref 70–99)
Potassium: 3.8 mmol/L (ref 3.5–5.1)
Sodium: 139 mmol/L (ref 135–145)

## 2022-10-13 LAB — CEA (ACCESS): CEA (CHCC): 1.49 ng/mL (ref 0.00–5.00)

## 2022-10-19 ENCOUNTER — Inpatient Hospital Stay (HOSPITAL_BASED_OUTPATIENT_CLINIC_OR_DEPARTMENT_OTHER): Payer: 59 | Admitting: Oncology

## 2022-10-19 ENCOUNTER — Encounter: Payer: Self-pay | Admitting: Oncology

## 2022-10-19 VITALS — BP 124/85 | HR 62 | Temp 98.1°F | Resp 18 | Ht 76.0 in | Wt 232.6 lb

## 2022-10-19 DIAGNOSIS — C184 Malignant neoplasm of transverse colon: Secondary | ICD-10-CM | POA: Diagnosis not present

## 2022-10-19 NOTE — Progress Notes (Signed)
[facility] OFFICE PROGRESS NOTE   Diagnosis: Colon cancer  INTERVAL HISTORY:   Mr. Trimnal returns as scheduled.  He generally feels well.  He has noted sinus drainage and an intermittent productive cough since January.  No reflux symptoms.  No difficulty with bowel function.  No bleeding. The liver enzymes were mildly elevated when he saw Dr. Okey Dupre in June.  He reports minimal alcohol use.  He smokes a cigar occasionally, but he is not a regular smoker.  No cigarette use. Objective:  Vital signs in last 24 hours:  Blood pressure 124/85, pulse 62, temperature 98.1 F (36.7 C), temperature source Oral, resp. rate 18, height 6\' 4"  (1.93 m), weight 232 lb 9.6 oz (105.5 kg), SpO2 99%.    Lymphatics: No cervical, supraclavicular, axillary, or inguinal nodes Resp: Lungs clear bilaterally, no respiratory distress Cardio: Regular rate and rhythm GI: Nontender, no mass, no hepatosplenomegaly Vascular: No leg edema   Lab Results:  Lab Results  Component Value Date   WBC 5.2 08/21/2022   HGB 15.8 08/21/2022   HCT 46.5 08/21/2022   MCV 90.3 08/21/2022   PLT 177.0 08/21/2022   NEUTROABS 3.1 04/07/2021    CMP  Lab Results  Component Value Date   NA 139 10/13/2022   K 3.8 10/13/2022   CL 101 10/13/2022   CO2 29 10/13/2022   GLUCOSE 179 (H) 10/13/2022   BUN 15 10/13/2022   CREATININE 0.96 10/13/2022   CALCIUM 9.7 10/13/2022   PROT 6.9 08/21/2022   ALBUMIN 4.5 08/21/2022   AST 85 (H) 08/21/2022   ALT 107 (H) 08/21/2022   ALKPHOS 47 08/21/2022   BILITOT 1.2 08/21/2022   GFRNONAA >60 10/13/2022   GFRAA 101 06/26/2019    Lab Results  Component Value Date   CEA 1.49 10/13/2022     Medications: I have reviewed the patient's current medications.   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, MSI-high, loss of MLH1 and PMS2 expression, negative for BRAF V600E mutation, MLH1 hypermethylation absent  Negative CancerNext panel-VUS in the [facility] gene colonoscopy 10/05/2020-near completely obstructing mass at the hepatic flexure-could not be passed, polyps in the sigmoid and transverse colon CTs 10/15/2020-short segment wall thickening with adjacent inflammatory stranding at the hepatic flexure, prominent mesenteric nodes adjacent to the colonic neoplasm measuring up to 5 mm, prominent gastropathic, hepatoduodenal ligament, and right sided retroperitoneal nodes measured up to 7 mm, subcentimeter hypodense hypodensities-too small to characterize, tiny right middle lobe nodule 12/13/2020-ctDNA not detected Cycle 1 CAPOX 12/13/2020, Xeloda discontinued around 12/24/2020 Cycle 1 FOLFOX 01/24/2021 Cycle 2 FOLFOX 02/07/2021 Guardant reveal 02/16/2021-detected Cycle 3 FOLFOX 02/21/2021 Cycle 4 FOLFOX 03/08/2021, 5-FU dose escalated, home Decadron prophylaxis added Cycle 5 FOLFOX 03/22/2021 Guardant reveal 04/07/2021-ct DNA not detected Guardant reveal 07/13/2021-negative for circulating tumor DNA CTs 10/11/2021-no evidence of metastatic disease CT 10/11/2022-no evidence of recurrent disease, centrilobular nodularity throughout the lungs, hepatic steatosis   Tubular adenoma on the colonoscopy 10/05/2020 and on colonoscopy 06/14/2010 Colonoscopy 09/15/2021-2 polyps removed from the transverse/ascending colon-tubular adenomas Hypertension Ocular rosacea Hospital admission 12/30/2020-acute gastroenteritis/colitis Negative for the IVS14+ DPYD mutation 6.  Oxaliplatin neuropathy      Disposition: Mr. Ghan is in clinical remission from colon cancer.  He will return for an office visit and CEA in 6 months.  He continues colonoscopy surveillance with Dr. Marina Goodell.  He will follow-up with Dr. Okey Dupre for repeat of the elevated liver enzymes.  I suggested he see  Dr. Marina Goodell if the liver enzymes remain elevated.  I doubt elevation of the liver enzymes is related to the chemotherapy from early 2023.  He declined a pulmonary medicine referral to evaluate the cough.  Thornton Papas, MD  10/19/2022  11:34 AM

## 2022-12-11 ENCOUNTER — Telehealth: Payer: Self-pay | Admitting: Internal Medicine

## 2022-12-11 DIAGNOSIS — R7989 Other specified abnormal findings of blood chemistry: Secondary | ICD-10-CM

## 2022-12-11 NOTE — Telephone Encounter (Signed)
Called pt and lvm

## 2022-12-11 NOTE — Telephone Encounter (Signed)
Lab in

## 2022-12-11 NOTE — Telephone Encounter (Signed)
Call pt and see what the reasoning is for lab work?

## 2022-12-11 NOTE — Telephone Encounter (Signed)
Patient called and requested to have lab work done as a follow up from his last appointment. He declined scheduling an appointment with PCP. Patient would like a call back at [phone].

## 2022-12-12 ENCOUNTER — Other Ambulatory Visit (INDEPENDENT_AMBULATORY_CARE_PROVIDER_SITE_OTHER): Payer: 59

## 2022-12-12 ENCOUNTER — Encounter: Payer: Self-pay | Admitting: Internal Medicine

## 2022-12-12 DIAGNOSIS — R7989 Other specified abnormal findings of blood chemistry: Secondary | ICD-10-CM | POA: Diagnosis not present

## 2022-12-12 LAB — HEPATIC FUNCTION PANEL
ALT: 48 U/L (ref 0–53)
AST: 36 U/L (ref 0–37)
Albumin: 4.5 g/dL (ref 3.5–5.2)
Alkaline Phosphatase: 42 U/L (ref 39–117)
Bilirubin, Direct: 0.2 mg/dL (ref 0.0–0.3)
Total Bilirubin: 1.1 mg/dL (ref 0.2–1.2)
Total Protein: 6.8 g/dL (ref 6.0–8.3)

## 2022-12-25 NOTE — Telephone Encounter (Signed)
error 

## 2023-02-22 ENCOUNTER — Other Ambulatory Visit: Payer: Self-pay | Admitting: Internal Medicine

## 2023-02-22 DIAGNOSIS — H10829 Rosacea conjunctivitis, unspecified eye: Secondary | ICD-10-CM

## 2023-02-27 ENCOUNTER — Encounter: Payer: Self-pay | Admitting: Internal Medicine

## 2023-02-27 DIAGNOSIS — H10829 Rosacea conjunctivitis, unspecified eye: Secondary | ICD-10-CM

## 2023-03-01 MED ORDER — DOXYCYCLINE HYCLATE 100 MG PO TABS
100.0000 mg | ORAL_TABLET | Freq: Two times a day (BID) | ORAL | 2 refills | Status: DC
Start: 2023-03-01 — End: 2023-09-12

## 2023-03-01 NOTE — Addendum Note (Signed)
Addended by: Hillard Danker A on: 03/01/2023 09:05 AM   Modules accepted: Orders

## 2023-04-25 ENCOUNTER — Ambulatory Visit: Payer: 59 | Admitting: Oncology

## 2023-04-25 ENCOUNTER — Telehealth: Payer: Self-pay

## 2023-04-25 ENCOUNTER — Inpatient Hospital Stay: Payer: 59 | Attending: Nurse Practitioner

## 2023-04-25 ENCOUNTER — Other Ambulatory Visit: Payer: Self-pay

## 2023-04-25 ENCOUNTER — Other Ambulatory Visit: Payer: 59

## 2023-04-25 ENCOUNTER — Inpatient Hospital Stay (HOSPITAL_BASED_OUTPATIENT_CLINIC_OR_DEPARTMENT_OTHER): Payer: 59 | Admitting: Nurse Practitioner

## 2023-04-25 ENCOUNTER — Encounter: Payer: Self-pay | Admitting: Nurse Practitioner

## 2023-04-25 VITALS — BP 107/86 | HR 62 | Temp 98.2°F | Resp 16 | Ht 76.0 in | Wt 213.1 lb

## 2023-04-25 DIAGNOSIS — C184 Malignant neoplasm of transverse colon: Secondary | ICD-10-CM

## 2023-04-25 DIAGNOSIS — I10 Essential (primary) hypertension: Secondary | ICD-10-CM | POA: Insufficient documentation

## 2023-04-25 DIAGNOSIS — G620 Drug-induced polyneuropathy: Secondary | ICD-10-CM | POA: Diagnosis not present

## 2023-04-25 DIAGNOSIS — Z79899 Other long term (current) drug therapy: Secondary | ICD-10-CM | POA: Diagnosis not present

## 2023-04-25 LAB — CMP (CANCER CENTER ONLY)
ALT: 31 U/L (ref 0–44)
AST: 26 U/L (ref 15–41)
Albumin: 4.7 g/dL (ref 3.5–5.0)
Alkaline Phosphatase: 40 U/L (ref 38–126)
Anion gap: 5 (ref 5–15)
BUN: 11 mg/dL (ref 8–23)
CO2: 31 mmol/L (ref 22–32)
Calcium: 9.2 mg/dL (ref 8.9–10.3)
Chloride: 104 mmol/L (ref 98–111)
Creatinine: 0.88 mg/dL (ref 0.61–1.24)
GFR, Estimated: >60 mL/min (ref >60)
Glucose, Bld: 114 mg/dL — ABNORMAL HIGH (ref 70–99)
Potassium: 4.1 mmol/L (ref 3.5–5.1)
Sodium: 140 mmol/L (ref 135–145)
Total Bilirubin: 1.1 mg/dL (ref 0.0–1.2)
Total Protein: 6.9 g/dL (ref 6.5–8.1)

## 2023-04-25 LAB — CEA (ACCESS): CEA (CHCC): 1.11 ng/mL (ref 0.00–5.00)

## 2023-04-25 NOTE — Telephone Encounter (Signed)
Patient gave verbal understanding and had no further questions or concerns

## 2023-04-25 NOTE — Telephone Encounter (Signed)
-----   Message from Lonna Cobb sent at 04/25/2023 11:27 AM EST ----- Please let him know CEA is stable in normal range.  Follow-up as scheduled.

## 2023-04-25 NOTE — Progress Notes (Signed)
  [facility] OFFICE PROGRESS NOTE   Diagnosis: Colon cancer  INTERVAL HISTORY:   Dylan Fischer returns as scheduled.  No change in bowel habits.  No blood or pain with bowel movements.  No nausea or vomiting.  No abdominal pain.  He has a good appetite.  Objective:  Vital signs in last 24 hours:  Blood pressure 107/86, pulse 62, temperature 98.2 F (36.8 C), temperature source Temporal, resp. rate 16, height 6\' 4"  (1.93 m), weight 213 lb 1.6 oz (96.7 kg), SpO2 100%.    Lymphatics: No palpable cervical, supraclavicular, axillary or inguinal lymph nodes. Resp: Lungs clear bilaterally. Cardio: Regular rate and rhythm. GI: Abdomen soft and nontender.  No mass.  No hepatosplenomegaly. Vascular: No leg edema.   Lab Results:  Lab Results  Component Value Date   WBC 5.2 08/21/2022   HGB 15.8 08/21/2022   HCT 46.5 08/21/2022   MCV 90.3 08/21/2022   PLT 177.0 08/21/2022   NEUTROABS 3.1 04/07/2021    Imaging:  No results found.  Medications: I have reviewed the patient's current medications.  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, MSI-high, loss of MLH1 and PMS2 expression, negative for BRAF V600E mutation, MLH1 hypermethylation absent  Negative CancerNext panel-VUS in the [facility] gene colonoscopy 10/05/2020-near completely obstructing mass at the hepatic flexure-could not be passed, polyps in the sigmoid and transverse colon CTs 10/15/2020-short segment wall thickening with adjacent inflammatory stranding at the hepatic flexure, prominent mesenteric nodes adjacent to the colonic neoplasm measuring up to 5 mm, prominent gastropathic, hepatoduodenal ligament, and right sided retroperitoneal nodes measured up to 7 mm, subcentimeter hypodense  hypodensities-too small to characterize, tiny right middle lobe nodule 12/13/2020-ctDNA not detected Cycle 1 CAPOX 12/13/2020, Xeloda discontinued around 12/24/2020 Cycle 1 FOLFOX 01/24/2021 Cycle 2 FOLFOX 02/07/2021 Guardant reveal 02/16/2021-detected Cycle 3 FOLFOX 02/21/2021 Cycle 4 FOLFOX 03/08/2021, 5-FU dose escalated, home Decadron prophylaxis added Cycle 5 FOLFOX 03/22/2021 Guardant reveal 04/07/2021-ct DNA not detected Guardant reveal 07/13/2021-negative for circulating tumor DNA CTs 10/11/2021-no evidence of metastatic disease CT 10/11/2022-no evidence of recurrent disease, centrilobular nodularity throughout the lungs, hepatic steatosis   Tubular adenoma on the colonoscopy 10/05/2020 and on colonoscopy 06/14/2010 Colonoscopy 09/15/2021-2 polyps removed from the transverse/ascending colon-tubular adenomas Hypertension Ocular rosacea Hospital admission 12/30/2020-acute gastroenteritis/colitis Negative for the IVS14+ DPYD mutation 6.  Oxaliplatin neuropathy  Disposition: Dylan Fischer remains in clinical remission from colon cancer.  We will follow-up on the CEA from today.  Plan for surveillance CT scans in 6 months.  He continues colonoscopy surveillance with Dr. Marina Goodell, next due 2026.  He will return for follow-up in 6 months.  We are available to see him sooner if needed.    Lonna Cobb ANP/GNP-BC   04/25/2023  10:41 AM

## 2023-08-22 ENCOUNTER — Other Ambulatory Visit: Payer: Self-pay | Admitting: Internal Medicine

## 2023-08-22 DIAGNOSIS — E785 Hyperlipidemia, unspecified: Secondary | ICD-10-CM

## 2023-09-10 ENCOUNTER — Other Ambulatory Visit: Payer: Self-pay | Admitting: Internal Medicine

## 2023-09-10 DIAGNOSIS — E785 Hyperlipidemia, unspecified: Secondary | ICD-10-CM

## 2023-09-12 ENCOUNTER — Ambulatory Visit (INDEPENDENT_AMBULATORY_CARE_PROVIDER_SITE_OTHER): Admitting: Internal Medicine

## 2023-09-12 ENCOUNTER — Encounter: Payer: Self-pay | Admitting: Internal Medicine

## 2023-09-12 VITALS — BP 118/80 | HR 70 | Temp 97.7°F | Ht 76.0 in | Wt 199.0 lb

## 2023-09-12 DIAGNOSIS — I10 Essential (primary) hypertension: Secondary | ICD-10-CM

## 2023-09-12 DIAGNOSIS — Z23 Encounter for immunization: Secondary | ICD-10-CM | POA: Diagnosis not present

## 2023-09-12 DIAGNOSIS — G620 Drug-induced polyneuropathy: Secondary | ICD-10-CM

## 2023-09-12 DIAGNOSIS — Z0000 Encounter for general adult medical examination without abnormal findings: Secondary | ICD-10-CM | POA: Diagnosis not present

## 2023-09-12 DIAGNOSIS — T451X5A Adverse effect of antineoplastic and immunosuppressive drugs, initial encounter: Secondary | ICD-10-CM

## 2023-09-12 DIAGNOSIS — R7303 Prediabetes: Secondary | ICD-10-CM | POA: Diagnosis not present

## 2023-09-12 DIAGNOSIS — H10829 Rosacea conjunctivitis, unspecified eye: Secondary | ICD-10-CM

## 2023-09-12 DIAGNOSIS — I700 Atherosclerosis of aorta: Secondary | ICD-10-CM

## 2023-09-12 DIAGNOSIS — E785 Hyperlipidemia, unspecified: Secondary | ICD-10-CM

## 2023-09-12 DIAGNOSIS — Z85038 Personal history of other malignant neoplasm of large intestine: Secondary | ICD-10-CM | POA: Diagnosis not present

## 2023-09-12 LAB — LIPID PANEL
Cholesterol: 129 mg/dL (ref 0–200)
HDL: 38.5 mg/dL — ABNORMAL LOW (ref >39.00)
LDL Cholesterol: 68 mg/dL (ref 0–99)
NonHDL: 90.48
Total CHOL/HDL Ratio: 3
Triglycerides: 110 mg/dL (ref 0.0–149.0)
VLDL: 22 mg/dL (ref 0.0–40.0)

## 2023-09-12 LAB — CBC
HCT: 46.6 % (ref 39.0–52.0)
Hemoglobin: 16.1 g/dL (ref 13.0–17.0)
MCHC: 34.6 g/dL (ref 30.0–36.0)
MCV: 89.6 fl (ref 78.0–100.0)
Platelets: 165 10*3/uL (ref 150.0–400.0)
RBC: 5.2 Mil/uL (ref 4.22–5.81)
RDW: 13.1 % (ref 11.5–15.5)
WBC: 4.1 10*3/uL (ref 4.0–10.5)

## 2023-09-12 LAB — HEMOGLOBIN A1C: Hgb A1c MFr Bld: 5.6 % (ref 4.6–6.5)

## 2023-09-12 MED ORDER — METRONIDAZOLE 0.75 % EX GEL: 1.0000 | Freq: Two times a day (BID) | CUTANEOUS | 0 refills | Status: AC

## 2023-09-12 MED ORDER — ROSUVASTATIN CALCIUM 40 MG PO TABS: 40.0000 mg | ORAL_TABLET | Freq: Every day | ORAL | 3 refills | Status: DC

## 2023-09-12 MED ORDER — OMEGA-3-ACID ETHYL ESTERS 1 G PO CAPS: 2.0000 g | ORAL_CAPSULE | Freq: Two times a day (BID) | ORAL | 3 refills | Status: DC

## 2023-09-12 MED ORDER — OLMESARTAN MEDOXOMIL-HCTZ 20-12.5 MG PO TABS: ORAL_TABLET | ORAL | 3 refills | Status: DC

## 2023-09-12 NOTE — Progress Notes (Signed)
   Subjective:   Patient ID: Dylan Fischer, male    DOB: 01/06/1961, 63 y.o.   MRN: [id number]  HPI The patient is here for physical.  PMH, [facility], social history reviewed and updated  Review of Systems  Constitutional: Negative.   HENT: Negative.    Eyes: Negative.   Respiratory:  Negative for cough, chest tightness and shortness of breath.   Cardiovascular:  Negative for chest pain, palpitations and leg swelling.  Gastrointestinal:  Negative for abdominal distention, abdominal pain, constipation, diarrhea, nausea and vomiting.  Musculoskeletal: Negative.   Skin: Negative.   Neurological: Negative.   Psychiatric/Behavioral: Negative.      Objective:  Physical Exam Constitutional:      Appearance: He is well-developed.  HENT:     Head: Normocephalic and atraumatic.  Cardiovascular:     Rate and Rhythm: Normal rate and regular rhythm.  Pulmonary:     Effort: Pulmonary effort is normal. No respiratory distress.     Breath sounds: Normal breath sounds. No wheezing or rales.  Abdominal:     General: Bowel sounds are normal. There is no distension.     Palpations: Abdomen is soft.     Tenderness: There is no abdominal tenderness. There is no rebound.  Musculoskeletal:     Cervical back: Normal range of motion.  Skin:    General: Skin is warm and dry.  Neurological:     Mental Status: He is alert and oriented to person, place, and time.     Coordination: Coordination normal.     Vitals:   09/12/23 0828  BP: 118/80  Pulse: 70  Temp: 97.7 F (36.5 C)  TempSrc: Oral  SpO2: 98%  Weight: 199 lb (90.3 kg)  Height: 6' 4 (1.93 m)    Assessment & Plan:  Prevnar 20 given at visit

## 2023-09-12 NOTE — Patient Instructions (Signed)
 Think about getting an MMR dose from the pharmacy.

## 2023-09-12 NOTE — Assessment & Plan Note (Signed)
 Has made dietary changes and weight down 35 pounds since last year. Checking HGA1c.

## 2023-09-12 NOTE — Assessment & Plan Note (Signed)
 With some spread to the cheeks rx metronidazole  gel.

## 2023-09-12 NOTE — Assessment & Plan Note (Signed)
 Taking statin and will continue.

## 2023-09-12 NOTE — Assessment & Plan Note (Signed)
 Taking crestor  and lovaza . Checking lipid panel and adjust as needed.

## 2023-09-12 NOTE — Assessment & Plan Note (Signed)
 Stable and does not take medication to help. Observe.

## 2023-09-12 NOTE — Assessment & Plan Note (Signed)
 Seeing oncology for management and due for 3 year follow up this year.

## 2023-09-12 NOTE — Assessment & Plan Note (Signed)
 Flu shot yearly. Pneumonia given. Shingrix complete. Tetanus up to date. Colonoscopy up to date. Counseled about sun safety and mole surveillance. Counseled about the dangers of distracted driving. Given 10 year screening recommendations.

## 2023-09-12 NOTE — Assessment & Plan Note (Signed)
 BP at goal on olmesartan /hydrochlorothiazide  20/12.5 mg daily and checking CMP and adjust as needed.

## 2023-09-17 ENCOUNTER — Ambulatory Visit: Payer: Self-pay | Admitting: Internal Medicine

## 2023-10-10 ENCOUNTER — Encounter: Payer: Self-pay | Admitting: Nurse Practitioner

## 2023-10-12 ENCOUNTER — Inpatient Hospital Stay: Payer: 59 | Attending: Nurse Practitioner

## 2023-10-12 DIAGNOSIS — D122 Benign neoplasm of ascending colon: Secondary | ICD-10-CM | POA: Diagnosis not present

## 2023-10-12 DIAGNOSIS — C184 Malignant neoplasm of transverse colon: Secondary | ICD-10-CM

## 2023-10-12 DIAGNOSIS — D123 Benign neoplasm of transverse colon: Secondary | ICD-10-CM | POA: Diagnosis present

## 2023-10-12 DIAGNOSIS — Z79899 Other long term (current) drug therapy: Secondary | ICD-10-CM | POA: Insufficient documentation

## 2023-10-12 DIAGNOSIS — I10 Essential (primary) hypertension: Secondary | ICD-10-CM | POA: Diagnosis not present

## 2023-10-12 LAB — CMP (CANCER CENTER ONLY)
ALT: 31 U/L (ref 0–44)
AST: 34 U/L (ref 15–41)
Albumin: 4.5 g/dL (ref 3.5–5.0)
Alkaline Phosphatase: 54 U/L (ref 38–126)
Anion gap: 10 (ref 5–15)
BUN: 11 mg/dL (ref 8–23)
CO2: 29 mmol/L (ref 22–32)
Calcium: 9.7 mg/dL (ref 8.9–10.3)
Chloride: 103 mmol/L (ref 98–111)
Creatinine: 1.02 mg/dL (ref 0.61–1.24)
GFR, Estimated: >60 mL/min (ref >60)
Glucose, Bld: 105 mg/dL — ABNORMAL HIGH (ref 70–99)
Potassium: 4 mmol/L (ref 3.5–5.1)
Sodium: 141 mmol/L (ref 135–145)
Total Bilirubin: 1.1 mg/dL (ref 0.0–1.2)
Total Protein: 6.9 g/dL (ref 6.5–8.1)

## 2023-10-12 LAB — CEA (ACCESS): CEA (CHCC): 1.5 ng/mL (ref 0.00–5.00)

## 2023-10-15 ENCOUNTER — Ambulatory Visit
Admission: RE | Admit: 2023-10-15 | Discharge: 2023-10-15 | Disposition: A | Discharge status: Home / Self Care | Payer: 59 | Source: Ambulatory Visit | Attending: Nurse Practitioner | Admitting: Nurse Practitioner

## 2023-10-15 DIAGNOSIS — C184 Malignant neoplasm of transverse colon: Secondary | ICD-10-CM

## 2023-10-15 MED ORDER — IOPAMIDOL (ISOVUE-300) INJECTION 61%
100.0000 mL | Freq: Once | INTRAVENOUS | Status: AC | PRN
  Administered 2023-10-15: 100 mL via INTRAVENOUS

## 2023-10-19 ENCOUNTER — Ambulatory Visit: Payer: Self-pay | Admitting: Oncology

## 2023-10-19 ENCOUNTER — Inpatient Hospital Stay: Payer: 59 | Admitting: Oncology

## 2023-10-19 NOTE — Telephone Encounter (Signed)
 Patient gave verbal understanding and had no further questions or concerns

## 2023-10-19 NOTE — Telephone Encounter (Signed)
-----   Message from Arley Hof sent at 10/19/2023  1:11 PM EDT ----- Please call patient, the CTs are negative for recurrent cancer, follow-up as scheduled  ----- Message ----- From: Debby Olam POUR, NP Sent: 10/19/2023   8:04 AM EDT To: Arley KATHEE Hof, MD   ----- Message ----- From: Interface, Rad Results In Sent: 10/18/2023   5:58 PM EDT To: Olam POUR Debby, NP

## 2023-10-26 ENCOUNTER — Inpatient Hospital Stay (HOSPITAL_BASED_OUTPATIENT_CLINIC_OR_DEPARTMENT_OTHER): Admitting: Oncology

## 2023-10-26 VITALS — BP 119/87 | HR 98 | Temp 97.8°F | Resp 18 | Ht 76.0 in | Wt 200.5 lb

## 2023-10-26 DIAGNOSIS — C184 Malignant neoplasm of transverse colon: Secondary | ICD-10-CM | POA: Diagnosis not present

## 2023-10-26 DIAGNOSIS — D123 Benign neoplasm of transverse colon: Secondary | ICD-10-CM | POA: Diagnosis not present

## 2023-10-26 NOTE — Progress Notes (Signed)
   [facility]ESS [facility]   Diagnosis: Colon cancer  INTERVAL HISTORY:   Mr. Hibler returns as scheduled.  He feels well.  Good appetite.  He reports intentional weight loss by changing his diet and exercising.  Neuropathy symptoms have improved.  He has mild numbness in the feet.  His balance has improved.  Objective:  Vital signs in last 24 hours:  Blood pressure 119/87, pulse 98, height 6' 4 (1.93 m), weight 200 lb 8 oz (90.9 kg), SpO2 100%.   Lymphatics: No cervical, supraclavicular, axillary, or inguinal nodes  Resp: Lungs clear bilaterally Cardio: Regular rate and rhythm GI: No hepatosplenomegaly, no mass, nontender Vascular: No leg edema   Lab Results:  Lab Results  Component Value Date   WBC 4.1 09/12/2023   HGB 16.1 09/12/2023   HCT 46.6 09/12/2023   MCV 89.6 09/12/2023   PLT 165.0 09/12/2023   NEUTROABS 3.1 04/07/2021    CMP  Lab Results  Component Value Date   NA 141 10/12/2023   K 4.0 10/12/2023   CL 103 10/12/2023   CO2 29 10/12/2023   GLUCOSE 105 (H) 10/12/2023   BUN 11 10/12/2023   CREATININE 1.02 10/12/2023   CALCIUM  9.7 10/12/2023   PROT 6.9 10/12/2023   ALBUMIN 4.5 10/12/2023   AST 34 10/12/2023   ALT 31 10/12/2023   ALKPHOS 54 10/12/2023   BILITOT 1.1 10/12/2023   GFRNONAA >60 10/12/2023   GFRAA 101 06/26/2019    Lab Results  Component Value Date   CEA 1.50 10/12/2023      Medications: I have reviewed the patient's current medications.   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, MSI-high, loss of MLH1 and PMS2 expression, negative for BRAF V600E mutation, MLH1 hypermethylation absent  Negative CancerNext panel-VUS in the [facility] gene colonoscopy 10/05/2020-near completely obstructing mass at  the hepatic flexure-could not be passed, polyps in the sigmoid and transverse colon CTs 10/15/2020-short segment wall thickening with adjacent inflammatory stranding at the hepatic flexure, prominent mesenteric nodes adjacent to the colonic neoplasm measuring up to 5 mm, prominent gastropathic, hepatoduodenal ligament, and right sided retroperitoneal nodes measured up to 7 mm, subcentimeter hypodense hypodensities-too small to characterize, tiny right middle lobe nodule 12/13/2020-ctDNA not detected Cycle 1 CAPOX 12/13/2020, Xeloda  discontinued around 12/24/2020 Cycle 1 FOLFOX 01/24/2021 Cycle 2 FOLFOX 02/07/2021 Guardant reveal 02/16/2021-detected Cycle 3 FOLFOX 02/21/2021 Cycle 4 FOLFOX 03/08/2021, 5-FU dose escalated, home Decadron  prophylaxis added Cycle 5 FOLFOX 03/22/2021 Guardant reveal 04/07/2021-ct DNA not detected Guardant reveal 07/13/2021-negative for circulating tumor DNA CTs 10/11/2021-no evidence of metastatic disease CT 10/11/2022-no evidence of recurrent disease, centrilobular nodularity throughout the lungs, hepatic steatosis CTs 10/15/2023-no evidence of recurrent disease   Tubular adenoma on the colonoscopy 10/05/2020 and on colonoscopy 06/14/2010 Colonoscopy 09/15/2021-2 polyps removed from the transverse/ascending colon-tubular adenomas Hypertension Ocular rosacea Hospital admission 12/30/2020-acute gastroenteritis/colitis Negative for the IVS14+ DPYD mutation 6.  Oxaliplatin  neuropathy    Disposition: Dylan Fischer remains in clinical remission from colon cancer.  He will schedule a colonoscopy in 2026.  He will return for an office visit and CEA in 6 months.  He will be scheduled for a final surveillance CT in 1 year.  Arley Hof, MD  10/26/2023  12:43 PM

## 2024-03-19 ENCOUNTER — Encounter: Payer: Self-pay | Admitting: Internal Medicine

## 2024-03-21 ENCOUNTER — Other Ambulatory Visit: Payer: Self-pay

## 2024-03-21 DIAGNOSIS — E785 Hyperlipidemia, unspecified: Secondary | ICD-10-CM

## 2024-03-21 DIAGNOSIS — I10 Essential (primary) hypertension: Secondary | ICD-10-CM

## 2024-03-21 MED ORDER — OLMESARTAN MEDOXOMIL-HCTZ 20-12.5 MG PO TABS: ORAL_TABLET | ORAL | 3 refills | Status: AC

## 2024-03-21 MED ORDER — OMEGA-3-ACID ETHYL ESTERS 1 G PO CAPS: 2.0000 g | ORAL_CAPSULE | Freq: Two times a day (BID) | ORAL | 3 refills | Status: AC

## 2024-03-21 MED ORDER — ROSUVASTATIN CALCIUM 40 MG PO TABS: 40.0000 mg | ORAL_TABLET | Freq: Every day | ORAL | 3 refills | Status: AC

## 2024-03-31 ENCOUNTER — Other Ambulatory Visit (HOSPITAL_COMMUNITY): Payer: Self-pay

## 2024-03-31 ENCOUNTER — Telehealth: Payer: Self-pay

## 2024-03-31 NOTE — Telephone Encounter (Signed)
 Pharmacy Patient Advocate Encounter   Received notification from Physician's Office that prior authorization for Omega 3 acid ethyl esters 1 g capsule is required/requested.   Insurance verification completed.   The patient is insured through [employer].   *30 day supply $38.40*  Per test claim: The current 90 day co-pay is, $91.50.  No PA needed at this time. This test claim was processed through [facility]- copay amounts may vary at other pharmacies due to pharmacy/plan contracts, or as the patient moves through the different stages of their insurance plan.

## 2024-04-02 NOTE — Telephone Encounter (Signed)
 Pt is aware.
